# Patient Record
Sex: Female | Born: 1955 | Race: White | Hispanic: No | Smoking: Current every day smoker
Health system: Southern US, Community
[De-identification: ages and names within clinical notes are randomized; demographics above are authoritative.]

## PROBLEM LIST (undated history)

## (undated) DIAGNOSIS — F32A Depression, unspecified: Secondary | ICD-10-CM

## (undated) DIAGNOSIS — N750 Cyst of Bartholin's gland: Secondary | ICD-10-CM

## (undated) DIAGNOSIS — F17200 Nicotine dependence, unspecified, uncomplicated: Secondary | ICD-10-CM

## (undated) DIAGNOSIS — Z923 Personal history of irradiation: Secondary | ICD-10-CM

## (undated) DIAGNOSIS — J449 Chronic obstructive pulmonary disease, unspecified: Secondary | ICD-10-CM

## (undated) DIAGNOSIS — M8580 Other specified disorders of bone density and structure, unspecified site: Secondary | ICD-10-CM

## (undated) DIAGNOSIS — R739 Hyperglycemia, unspecified: Secondary | ICD-10-CM

## (undated) DIAGNOSIS — N823 Fistula of vagina to large intestine: Secondary | ICD-10-CM

## (undated) DIAGNOSIS — C801 Malignant (primary) neoplasm, unspecified: Secondary | ICD-10-CM

## (undated) DIAGNOSIS — F419 Anxiety disorder, unspecified: Secondary | ICD-10-CM

## (undated) DIAGNOSIS — F329 Major depressive disorder, single episode, unspecified: Secondary | ICD-10-CM

## (undated) HISTORY — DX: Depression, unspecified: F32.A

## (undated) HISTORY — PX: OTHER SURGICAL HISTORY: SHX169

## (undated) HISTORY — DX: Cyst of Bartholin's gland: N75.0

## (undated) HISTORY — DX: Anxiety disorder, unspecified: F41.9

## (undated) HISTORY — PX: NASAL SINUS SURGERY: SHX719

## (undated) HISTORY — DX: Major depressive disorder, single episode, unspecified: F32.9

## (undated) HISTORY — DX: Fistula of vagina to large intestine: N82.3

## (undated) HISTORY — DX: Chronic obstructive pulmonary disease, unspecified: J44.9

## (undated) HISTORY — DX: Personal history of irradiation: Z92.3

## (undated) HISTORY — DX: Nicotine dependence, unspecified, uncomplicated: F17.200

## (undated) HISTORY — DX: Hyperglycemia, unspecified: R73.9

## (undated) HISTORY — DX: Other specified disorders of bone density and structure, unspecified site: M85.80

## (undated) HISTORY — PX: BREAST LUMPECTOMY: SHX2

## (undated) HISTORY — PX: TONSILLECTOMY: SUR1361

---

## 1998-09-10 ENCOUNTER — Ambulatory Visit (HOSPITAL_COMMUNITY): Admission: AD | Admit: 1998-09-10 | Discharge: 1998-09-10 | Payer: Self-pay

## 1999-11-14 ENCOUNTER — Encounter: Payer: Self-pay | Admitting: Internal Medicine

## 1999-11-14 ENCOUNTER — Encounter: Admission: RE | Admit: 1999-11-14 | Discharge: 1999-11-14 | Payer: Self-pay | Admitting: Internal Medicine

## 2001-02-01 ENCOUNTER — Encounter: Admission: RE | Admit: 2001-02-01 | Discharge: 2001-02-01 | Payer: Self-pay | Admitting: Internal Medicine

## 2001-02-01 ENCOUNTER — Encounter: Payer: Self-pay | Admitting: Internal Medicine

## 2001-02-17 HISTORY — PX: KNEE SURGERY: SHX244

## 2002-02-03 ENCOUNTER — Encounter: Admission: RE | Admit: 2002-02-03 | Discharge: 2002-02-03 | Payer: Self-pay | Admitting: Internal Medicine

## 2002-02-03 ENCOUNTER — Encounter: Payer: Self-pay | Admitting: Internal Medicine

## 2002-05-16 ENCOUNTER — Inpatient Hospital Stay (HOSPITAL_COMMUNITY): Admission: EM | Admit: 2002-05-16 | Discharge: 2002-05-18 | Payer: Self-pay | Admitting: Emergency Medicine

## 2002-05-17 ENCOUNTER — Encounter: Payer: Self-pay | Admitting: Neurology

## 2002-05-18 ENCOUNTER — Encounter: Payer: Self-pay | Admitting: Cardiology

## 2003-01-20 ENCOUNTER — Encounter: Admission: RE | Admit: 2003-01-20 | Discharge: 2003-01-20 | Payer: Self-pay | Admitting: Internal Medicine

## 2003-11-10 ENCOUNTER — Encounter: Admission: RE | Admit: 2003-11-10 | Discharge: 2003-11-10 | Payer: Self-pay | Admitting: Internal Medicine

## 2003-11-21 ENCOUNTER — Encounter: Admission: RE | Admit: 2003-11-21 | Discharge: 2003-11-21 | Payer: Self-pay | Admitting: Orthopedic Surgery

## 2004-12-10 ENCOUNTER — Encounter: Admission: RE | Admit: 2004-12-10 | Discharge: 2004-12-10 | Payer: Self-pay | Admitting: Internal Medicine

## 2005-02-11 ENCOUNTER — Encounter: Admission: RE | Admit: 2005-02-11 | Discharge: 2005-02-11 | Payer: Self-pay | Admitting: Internal Medicine

## 2005-12-01 ENCOUNTER — Ambulatory Visit: Payer: Self-pay | Admitting: Family Medicine

## 2005-12-29 ENCOUNTER — Ambulatory Visit: Payer: Self-pay | Admitting: Family Medicine

## 2006-03-11 ENCOUNTER — Ambulatory Visit: Payer: Self-pay | Admitting: Internal Medicine

## 2006-04-20 ENCOUNTER — Ambulatory Visit: Payer: Self-pay | Admitting: Family Medicine

## 2006-05-27 DIAGNOSIS — F329 Major depressive disorder, single episode, unspecified: Secondary | ICD-10-CM

## 2006-05-27 DIAGNOSIS — F411 Generalized anxiety disorder: Secondary | ICD-10-CM | POA: Insufficient documentation

## 2006-05-27 DIAGNOSIS — J449 Chronic obstructive pulmonary disease, unspecified: Secondary | ICD-10-CM

## 2006-05-27 DIAGNOSIS — J301 Allergic rhinitis due to pollen: Secondary | ICD-10-CM

## 2006-09-02 ENCOUNTER — Telehealth (INDEPENDENT_AMBULATORY_CARE_PROVIDER_SITE_OTHER): Payer: Self-pay | Admitting: *Deleted

## 2006-10-26 ENCOUNTER — Ambulatory Visit: Payer: Self-pay | Admitting: Family Medicine

## 2006-10-26 ENCOUNTER — Telehealth (INDEPENDENT_AMBULATORY_CARE_PROVIDER_SITE_OTHER): Payer: Self-pay | Admitting: *Deleted

## 2007-01-08 ENCOUNTER — Ambulatory Visit: Payer: Self-pay | Admitting: Family Medicine

## 2007-01-10 LAB — CONVERTED CEMR LAB
Cholesterol: 156 mg/dL (ref 0–200)
Glucose, Bld: 109 mg/dL — ABNORMAL HIGH (ref 70–99)
HDL: 45 mg/dL (ref >39.0)
LDL Cholesterol: 100 mg/dL — ABNORMAL HIGH (ref 0–99)
TSH: 1.18 microintl units/mL (ref 0.35–5.50)
Total CHOL/HDL Ratio: 3.5
Triglycerides: 53 mg/dL (ref 0–149)
VLDL: 11 mg/dL (ref 0–40)

## 2007-01-11 ENCOUNTER — Encounter (INDEPENDENT_AMBULATORY_CARE_PROVIDER_SITE_OTHER): Payer: Self-pay | Admitting: *Deleted

## 2007-01-11 ENCOUNTER — Telehealth (INDEPENDENT_AMBULATORY_CARE_PROVIDER_SITE_OTHER): Payer: Self-pay | Admitting: *Deleted

## 2007-03-18 ENCOUNTER — Ambulatory Visit: Payer: Self-pay | Admitting: Family Medicine

## 2007-04-08 ENCOUNTER — Ambulatory Visit: Payer: Self-pay | Admitting: Family Medicine

## 2007-05-03 ENCOUNTER — Ambulatory Visit: Payer: Self-pay | Admitting: Internal Medicine

## 2007-05-03 DIAGNOSIS — F17200 Nicotine dependence, unspecified, uncomplicated: Secondary | ICD-10-CM

## 2007-06-22 ENCOUNTER — Ambulatory Visit: Payer: Self-pay | Admitting: Internal Medicine

## 2007-06-22 ENCOUNTER — Telehealth (INDEPENDENT_AMBULATORY_CARE_PROVIDER_SITE_OTHER): Payer: Self-pay | Admitting: *Deleted

## 2007-10-06 ENCOUNTER — Ambulatory Visit: Payer: Self-pay | Admitting: Internal Medicine

## 2007-10-06 DIAGNOSIS — E041 Nontoxic single thyroid nodule: Secondary | ICD-10-CM | POA: Insufficient documentation

## 2007-10-13 ENCOUNTER — Encounter: Admission: RE | Admit: 2007-10-13 | Discharge: 2007-10-13 | Payer: Self-pay | Admitting: Internal Medicine

## 2007-11-04 ENCOUNTER — Telehealth (INDEPENDENT_AMBULATORY_CARE_PROVIDER_SITE_OTHER): Payer: Self-pay | Admitting: *Deleted

## 2007-12-30 ENCOUNTER — Ambulatory Visit: Payer: Self-pay | Admitting: Internal Medicine

## 2007-12-30 DIAGNOSIS — R7309 Other abnormal glucose: Secondary | ICD-10-CM | POA: Insufficient documentation

## 2007-12-30 LAB — CONVERTED CEMR LAB
Cholesterol, target level: 200 mg/dL
HDL goal, serum: 40 mg/dL
LDL Goal: 160 mg/dL

## 2008-01-11 ENCOUNTER — Encounter (INDEPENDENT_AMBULATORY_CARE_PROVIDER_SITE_OTHER): Payer: Self-pay | Admitting: *Deleted

## 2008-01-11 LAB — CONVERTED CEMR LAB
Cholesterol: 205 mg/dL (ref 0–200)
Direct LDL: 125.2 mg/dL
HDL: 45 mg/dL (ref >39.0)
Hgb A1c MFr Bld: 6 % (ref 4.6–6.0)
Total CHOL/HDL Ratio: 4.6
Triglycerides: 76 mg/dL (ref 0–149)
VLDL: 15 mg/dL (ref 0–40)

## 2008-01-31 ENCOUNTER — Telehealth (INDEPENDENT_AMBULATORY_CARE_PROVIDER_SITE_OTHER): Payer: Self-pay | Admitting: *Deleted

## 2008-02-02 ENCOUNTER — Ambulatory Visit: Payer: Self-pay | Admitting: Internal Medicine

## 2008-02-02 DIAGNOSIS — D689 Coagulation defect, unspecified: Secondary | ICD-10-CM

## 2008-02-02 DIAGNOSIS — B370 Candidal stomatitis: Secondary | ICD-10-CM

## 2008-03-23 ENCOUNTER — Telehealth: Payer: Self-pay | Admitting: Internal Medicine

## 2008-03-27 ENCOUNTER — Ambulatory Visit: Payer: Self-pay | Admitting: Internal Medicine

## 2008-06-13 ENCOUNTER — Telehealth (INDEPENDENT_AMBULATORY_CARE_PROVIDER_SITE_OTHER): Payer: Self-pay | Admitting: *Deleted

## 2008-06-15 ENCOUNTER — Ambulatory Visit: Payer: Self-pay | Admitting: Internal Medicine

## 2008-06-22 ENCOUNTER — Telehealth (INDEPENDENT_AMBULATORY_CARE_PROVIDER_SITE_OTHER): Payer: Self-pay | Admitting: *Deleted

## 2008-07-12 ENCOUNTER — Telehealth (INDEPENDENT_AMBULATORY_CARE_PROVIDER_SITE_OTHER): Payer: Self-pay | Admitting: *Deleted

## 2008-07-14 ENCOUNTER — Telehealth (INDEPENDENT_AMBULATORY_CARE_PROVIDER_SITE_OTHER): Payer: Self-pay | Admitting: *Deleted

## 2008-07-21 ENCOUNTER — Telehealth (INDEPENDENT_AMBULATORY_CARE_PROVIDER_SITE_OTHER): Payer: Self-pay | Admitting: *Deleted

## 2008-10-05 ENCOUNTER — Telehealth (INDEPENDENT_AMBULATORY_CARE_PROVIDER_SITE_OTHER): Payer: Self-pay | Admitting: *Deleted

## 2009-01-01 ENCOUNTER — Encounter (INDEPENDENT_AMBULATORY_CARE_PROVIDER_SITE_OTHER): Payer: Self-pay | Admitting: *Deleted

## 2009-01-02 ENCOUNTER — Telehealth (INDEPENDENT_AMBULATORY_CARE_PROVIDER_SITE_OTHER): Payer: Self-pay | Admitting: *Deleted

## 2009-02-15 ENCOUNTER — Ambulatory Visit: Payer: Self-pay | Admitting: Internal Medicine

## 2009-02-19 ENCOUNTER — Encounter (INDEPENDENT_AMBULATORY_CARE_PROVIDER_SITE_OTHER): Payer: Self-pay | Admitting: *Deleted

## 2009-03-07 ENCOUNTER — Telehealth (INDEPENDENT_AMBULATORY_CARE_PROVIDER_SITE_OTHER): Payer: Self-pay | Admitting: *Deleted

## 2009-04-11 ENCOUNTER — Ambulatory Visit: Payer: Self-pay | Admitting: Internal Medicine

## 2010-01-03 ENCOUNTER — Ambulatory Visit: Payer: Self-pay | Admitting: Internal Medicine

## 2010-01-03 ENCOUNTER — Telehealth: Payer: Self-pay | Admitting: Internal Medicine

## 2010-01-03 DIAGNOSIS — R0902 Hypoxemia: Secondary | ICD-10-CM

## 2010-01-03 DIAGNOSIS — R609 Edema, unspecified: Secondary | ICD-10-CM

## 2010-01-04 LAB — CONVERTED CEMR LAB
ALT: 27 units/L (ref 0–35)
AST: 25 units/L (ref 0–37)
Albumin: 4.2 g/dL (ref 3.5–5.2)
Alkaline Phosphatase: 96 units/L (ref 39–117)
BUN: 10 mg/dL (ref 6–23)
Basophils Absolute: 0.1 10*3/uL (ref 0.0–0.1)
Basophils Relative: 0.8 % (ref 0.0–3.0)
Bilirubin, Direct: 0.3 mg/dL (ref 0.0–0.3)
CO2: 26 meq/L (ref 19–32)
Calcium: 9.2 mg/dL (ref 8.4–10.5)
Chloride: 101 meq/L (ref 96–112)
Creatinine, Ser: 0.7 mg/dL (ref 0.4–1.2)
Eosinophils Absolute: 0 10*3/uL (ref 0.0–0.7)
Eosinophils Relative: 0.4 % (ref 0.0–5.0)
GFR calc non Af Amer: 89.01 mL/min (ref >60)
Glucose, Bld: 97 mg/dL (ref 70–99)
HCT: 52.6 % — ABNORMAL HIGH (ref 36.0–46.0)
Hemoglobin: 17.6 g/dL — ABNORMAL HIGH (ref 12.0–15.0)
Lymphocytes Relative: 18.9 % (ref 12.0–46.0)
Lymphs Abs: 1.5 10*3/uL (ref 0.7–4.0)
MCHC: 33.5 g/dL (ref 30.0–36.0)
MCV: 103.4 fL — ABNORMAL HIGH (ref 78.0–100.0)
Monocytes Absolute: 0.7 10*3/uL (ref 0.1–1.0)
Monocytes Relative: 8.9 % (ref 3.0–12.0)
Neutro Abs: 5.8 10*3/uL (ref 1.4–7.7)
Neutrophils Relative %: 71 % (ref 43.0–77.0)
Platelets: 152 10*3/uL (ref 150.0–400.0)
Potassium: 4.4 meq/L (ref 3.5–5.1)
Pro B Natriuretic peptide (BNP): 203.7 pg/mL — ABNORMAL HIGH (ref 0.0–100.0)
RBC: 5.09 M/uL (ref 3.87–5.11)
RDW: 15.3 % — ABNORMAL HIGH (ref 11.5–14.6)
Sodium: 140 meq/L (ref 135–145)
Total Bilirubin: 1.5 mg/dL — ABNORMAL HIGH (ref 0.3–1.2)
Total Protein: 6.5 g/dL (ref 6.0–8.3)
WBC: 8.1 10*3/uL (ref 4.5–10.5)

## 2010-01-07 ENCOUNTER — Telehealth (INDEPENDENT_AMBULATORY_CARE_PROVIDER_SITE_OTHER): Payer: Self-pay | Admitting: *Deleted

## 2010-01-14 ENCOUNTER — Encounter: Payer: Self-pay | Admitting: Internal Medicine

## 2010-01-14 ENCOUNTER — Encounter (INDEPENDENT_AMBULATORY_CARE_PROVIDER_SITE_OTHER): Payer: Self-pay | Admitting: *Deleted

## 2010-01-14 ENCOUNTER — Ambulatory Visit: Payer: Self-pay | Admitting: Internal Medicine

## 2010-01-14 ENCOUNTER — Ambulatory Visit (HOSPITAL_COMMUNITY): Admission: RE | Admit: 2010-01-14 | Discharge: 2010-01-14 | Payer: Self-pay | Admitting: Internal Medicine

## 2010-01-14 ENCOUNTER — Ambulatory Visit: Payer: Self-pay

## 2010-01-15 ENCOUNTER — Telehealth: Payer: Self-pay | Admitting: Internal Medicine

## 2010-01-21 ENCOUNTER — Ambulatory Visit: Payer: Self-pay | Admitting: Internal Medicine

## 2010-01-21 ENCOUNTER — Telehealth (INDEPENDENT_AMBULATORY_CARE_PROVIDER_SITE_OTHER): Payer: Self-pay | Admitting: *Deleted

## 2010-01-21 DIAGNOSIS — I2789 Other specified pulmonary heart diseases: Secondary | ICD-10-CM

## 2010-01-21 DIAGNOSIS — J984 Other disorders of lung: Secondary | ICD-10-CM

## 2010-01-21 DIAGNOSIS — I2699 Other pulmonary embolism without acute cor pulmonale: Secondary | ICD-10-CM

## 2010-01-23 ENCOUNTER — Ambulatory Visit: Payer: Self-pay | Admitting: Internal Medicine

## 2010-01-23 ENCOUNTER — Telehealth (INDEPENDENT_AMBULATORY_CARE_PROVIDER_SITE_OTHER): Payer: Self-pay | Admitting: *Deleted

## 2010-01-24 ENCOUNTER — Encounter (INDEPENDENT_AMBULATORY_CARE_PROVIDER_SITE_OTHER): Payer: Self-pay | Admitting: *Deleted

## 2010-01-24 ENCOUNTER — Telehealth: Payer: Self-pay | Admitting: Internal Medicine

## 2010-01-29 ENCOUNTER — Encounter: Payer: Self-pay | Admitting: Internal Medicine

## 2010-01-29 DIAGNOSIS — R0602 Shortness of breath: Secondary | ICD-10-CM

## 2010-02-04 ENCOUNTER — Ambulatory Visit: Payer: Self-pay | Admitting: Internal Medicine

## 2010-02-05 ENCOUNTER — Telehealth: Payer: Self-pay | Admitting: Internal Medicine

## 2010-02-05 ENCOUNTER — Encounter (INDEPENDENT_AMBULATORY_CARE_PROVIDER_SITE_OTHER): Payer: Self-pay | Admitting: *Deleted

## 2010-02-08 ENCOUNTER — Encounter: Payer: Self-pay | Admitting: Internal Medicine

## 2010-02-12 ENCOUNTER — Telehealth: Payer: Self-pay | Admitting: Internal Medicine

## 2010-02-12 ENCOUNTER — Encounter: Payer: Self-pay | Admitting: Internal Medicine

## 2010-02-17 ENCOUNTER — Encounter: Payer: Self-pay | Admitting: Internal Medicine

## 2010-02-20 ENCOUNTER — Encounter: Payer: Self-pay | Admitting: Internal Medicine

## 2010-02-25 ENCOUNTER — Telehealth: Payer: Self-pay | Admitting: Internal Medicine

## 2010-02-25 ENCOUNTER — Encounter: Payer: Self-pay | Admitting: Internal Medicine

## 2010-02-27 ENCOUNTER — Telehealth (INDEPENDENT_AMBULATORY_CARE_PROVIDER_SITE_OTHER): Payer: Self-pay | Admitting: *Deleted

## 2010-03-01 ENCOUNTER — Ambulatory Visit
Admission: RE | Admit: 2010-03-01 | Discharge: 2010-03-01 | Payer: Self-pay | Source: Home / Self Care | Attending: Internal Medicine | Admitting: Internal Medicine

## 2010-03-01 ENCOUNTER — Ambulatory Visit
Admission: RE | Admit: 2010-03-01 | Discharge: 2010-03-01 | Payer: Self-pay | Source: Home / Self Care | Attending: Critical Care Medicine | Admitting: Critical Care Medicine

## 2010-03-01 ENCOUNTER — Other Ambulatory Visit: Payer: Self-pay | Admitting: Internal Medicine

## 2010-03-01 ENCOUNTER — Encounter: Payer: Self-pay | Admitting: Critical Care Medicine

## 2010-03-01 LAB — CBC WITH DIFFERENTIAL/PLATELET
Basophils Absolute: 0 10*3/uL (ref 0.0–0.1)
Basophils Relative: 0.5 % (ref 0.0–3.0)
Eosinophils Absolute: 0 10*3/uL (ref 0.0–0.7)
Eosinophils Relative: 0.5 % (ref 0.0–5.0)
HCT: 58.6 % — ABNORMAL HIGH (ref 36.0–46.0)
Hemoglobin: 19.9 g/dL — ABNORMAL HIGH (ref 12.0–15.0)
Lymphocytes Relative: 16.2 % (ref 12.0–46.0)
Lymphs Abs: 1 10*3/uL (ref 0.7–4.0)
MCHC: 33.9 g/dL (ref 30.0–36.0)
MCV: 100.3 fl — ABNORMAL HIGH (ref 78.0–100.0)
Monocytes Absolute: 0.8 10*3/uL (ref 0.1–1.0)
Monocytes Relative: 12.2 % — ABNORMAL HIGH (ref 3.0–12.0)
Neutro Abs: 4.5 10*3/uL (ref 1.4–7.7)
Neutrophils Relative %: 70.6 % (ref 43.0–77.0)
Platelets: 132 10*3/uL — ABNORMAL LOW (ref 150.0–400.0)
RBC: 5.84 Mil/uL — ABNORMAL HIGH (ref 3.87–5.11)
RDW: 14.7 % — ABNORMAL HIGH (ref 11.5–14.6)
WBC: 6.4 10*3/uL (ref 4.5–10.5)

## 2010-03-01 LAB — PROTIME-INR
INR: 1.1 ratio — ABNORMAL HIGH (ref 0.8–1.0)
Prothrombin Time: 12.3 s (ref 10.2–12.4)

## 2010-03-01 LAB — BASIC METABOLIC PANEL
BUN: 8 mg/dL (ref 6–23)
CO2: 32 mEq/L (ref 19–32)
Calcium: 9.6 mg/dL (ref 8.4–10.5)
Chloride: 100 mEq/L (ref 96–112)
Creatinine, Ser: 0.7 mg/dL (ref 0.4–1.2)
GFR: 91.85 mL/min (ref >60.00)
Glucose, Bld: 113 mg/dL — ABNORMAL HIGH (ref 70–99)
Potassium: 5.2 mEq/L — ABNORMAL HIGH (ref 3.5–5.1)
Sodium: 142 mEq/L (ref 135–145)

## 2010-03-01 LAB — APTT: aPTT: 33.2 s — ABNORMAL HIGH (ref 21.7–28.8)

## 2010-03-04 ENCOUNTER — Telehealth (INDEPENDENT_AMBULATORY_CARE_PROVIDER_SITE_OTHER): Payer: Self-pay | Admitting: *Deleted

## 2010-03-08 ENCOUNTER — Ambulatory Visit
Admission: RE | Admit: 2010-03-08 | Payer: Self-pay | Source: Home / Self Care | Attending: Cardiology | Admitting: Cardiology

## 2010-03-10 ENCOUNTER — Encounter: Payer: Self-pay | Admitting: Orthopedic Surgery

## 2010-03-11 LAB — POCT I-STAT 3, VENOUS BLOOD GAS (G3P V)
Acid-Base Excess: 4 mmol/L — ABNORMAL HIGH (ref 0.0–2.0)
Acid-Base Excess: 6 mmol/L — ABNORMAL HIGH (ref 0.0–2.0)
Acid-Base Excess: 6 mmol/L — ABNORMAL HIGH (ref 0.0–2.0)
Acid-Base Excess: 7 mmol/L — ABNORMAL HIGH (ref 0.0–2.0)
Acid-Base Excess: 8 mmol/L — ABNORMAL HIGH (ref 0.0–2.0)
Acid-Base Excess: 9 mmol/L — ABNORMAL HIGH (ref 0.0–2.0)
Bicarbonate: 34.4 mEq/L — ABNORMAL HIGH (ref 20.0–24.0)
Bicarbonate: 35.4 mEq/L — ABNORMAL HIGH (ref 20.0–24.0)
Bicarbonate: 35.8 mEq/L — ABNORMAL HIGH (ref 20.0–24.0)
Bicarbonate: 38.2 mEq/L — ABNORMAL HIGH (ref 20.0–24.0)
Bicarbonate: 39.4 mEq/L — ABNORMAL HIGH (ref 20.0–24.0)
O2 Saturation: 51 %
O2 Saturation: 54 %
O2 Saturation: 54 %
O2 Saturation: 55 %
O2 Saturation: 56 %
TCO2: 36 mmol/L (ref 0–100)
TCO2: 36 mmol/L (ref 0–100)
TCO2: 37 mmol/L (ref 0–100)
TCO2: 38 mmol/L (ref 0–100)
TCO2: 38 mmol/L (ref 0–100)
TCO2: 40 mmol/L (ref 0–100)
TCO2: 42 mmol/L (ref 0–100)
pCO2, Ven: 65.1 mmHg — ABNORMAL HIGH (ref 45.0–50.0)
pCO2, Ven: 67.2 mmHg — ABNORMAL HIGH (ref 45.0–50.0)
pCO2, Ven: 67.6 mmHg — ABNORMAL HIGH (ref 45.0–50.0)
pCO2, Ven: 69 mmHg — ABNORMAL HIGH (ref 45.0–50.0)
pCO2, Ven: 71 mmHg (ref 45.0–50.0)
pCO2, Ven: 72.6 mmHg (ref 45.0–50.0)
pH, Ven: 7.299 (ref 7.250–7.300)
pH, Ven: 7.33 — ABNORMAL HIGH (ref 7.250–7.300)
pH, Ven: 7.335 — ABNORMAL HIGH (ref 7.250–7.300)
pH, Ven: 7.335 — ABNORMAL HIGH (ref 7.250–7.300)
pH, Ven: 7.336 — ABNORMAL HIGH (ref 7.250–7.300)
pH, Ven: 7.338 — ABNORMAL HIGH (ref 7.250–7.300)
pH, Ven: 7.342 — ABNORMAL HIGH (ref 7.250–7.300)
pO2, Ven: 26 mmHg — CL (ref 30.0–45.0)
pO2, Ven: 30 mmHg (ref 30.0–45.0)
pO2, Ven: 31 mmHg (ref 30.0–45.0)
pO2, Ven: 32 mmHg (ref 30.0–45.0)
pO2, Ven: 32 mmHg (ref 30.0–45.0)
pO2, Ven: 32 mmHg (ref 30.0–45.0)

## 2010-03-11 LAB — POCT I-STAT 3, ART BLOOD GAS (G3+)
Bicarbonate: 38.9 mEq/L — ABNORMAL HIGH (ref 20.0–24.0)
O2 Saturation: 94 %
TCO2: 41 mmol/L (ref 0–100)
pCO2 arterial: 68.9 mmHg (ref 35.0–45.0)
pH, Arterial: 7.359 (ref 7.350–7.400)
pO2, Arterial: 77 mmHg — ABNORMAL LOW (ref 80.0–100.0)

## 2010-03-11 LAB — GLUCOSE, POCT (MANUAL RESULT ENTRY)
Glucose, Bld: 102 mg/dL — ABNORMAL HIGH (ref 70–99)
Operator id: 221371

## 2010-03-14 NOTE — Procedures (Addendum)
  NAME:  Sherry Bautista, Sherry Bautista                ACCOUNT NO.:  [id number]  MEDICAL RECORD NO.:  [id number]          PATIENT TYPE:  OIB  LOCATION:  1962                         FACILITY:  [facility]  PHYSICIAN:  Rollene Rotunda, MD, FACCDATE OF BIRTH:  08/01/1955  DATE OF PROCEDURE: DATE OF DISCHARGE:  03/08/2010                           CARDIAC CATHETERIZATION   PRIMARY:  Titus Dubin. Alwyn Ren, MD,FACP,FCCP  CARDIOLOGIST:  Pricilla Riffle, MD, [facility]  PROCEDURE:  Left and right heart catheterization/coronary arteriography.  INDICATIONS:  The patient with dyspnea, pulmonary hypertension, and an abnormal EKG.  PROCEDURE NOTE:  Right and left heart catheterization was performed via the right femoral vein and artery respectively.  Both vessels were cannulated using anterior wall puncture.  A #7-French venous sheath and a #4-French arterial sheath were inserted via the modified Seldinger technique.  Preformed Judkins and pigtail catheter were utilized.  The patient tolerated the procedure well and left the lab in stable condition.  RESULTS:  Hemodynamics:  RA mean 8, RV 72/9, PA 73/36 with a mean of 50, pulmonary capillary wedge pressure mean 34 (this was sampled in multiple areas and was 34-40 each time except in the extreme right upper lobe where it was found to be low where it was over wedged.  Therefore, I do believe that the true wedge is in the 30-40 mmHg range), left ventricle 114/14, AO 119/101.  Sats, inferior vena cava 42%, right atrium below 51%, right atrium mid 51%, right atrium high 50%, SVC 54%, right ventricle 56%, PA 54%, AO 94% (all sats were on 3 liters).  Coronaries: Left main was short and normal.  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  The mid and distal vessel had luminal irregularities. First and second diagonal were small and normal.  Circumflex was essentially dominant.  The AV groove had luminal irregularities.  Mid obtuse marginal was large and normal.  PDA was large and  normal. Posterolateral was moderate size and normal.  The right coronary artery was moderate-sized, nondominant, and normal throughout its course.  Left ventriculogram:  The left ventriculogram was obtained in the RAO projection.  The EF was 65%.  CONCLUSION:  Severe pulmonary hypertension.  There is an elevated wedge pressure as described above.  There is normal LV function, minimal coronary artery plaquing, and no evidence for shunt.  PLAN:  The patient's further management and evaluation of pulmonary hypertension per Dr. Tenny Craw.    Rollene Rotunda, MD, [facility]    JH/MEDQ  D:  03/08/2010  T:  03/08/2010  Job:  [id number]  Electronically Signed by Rollene Rotunda MD [facility] on 03/14/2010 12:30:48 PM

## 2010-03-17 LAB — CONVERTED CEMR LAB
ALT: 30 units/L (ref 0–35)
AST: 26 units/L (ref 0–37)
Albumin: 4.1 g/dL (ref 3.5–5.2)
Alkaline Phosphatase: 86 units/L (ref 39–117)
BUN: 5 mg/dL — ABNORMAL LOW (ref 6–23)
Basophils Absolute: 0.1 10*3/uL (ref 0.0–0.1)
Basophils Relative: 1.1 % (ref 0.0–3.0)
Bilirubin, Direct: 0.2 mg/dL (ref 0.0–0.3)
CO2: 31 meq/L (ref 19–32)
Calcium: 9.6 mg/dL (ref 8.4–10.5)
Chloride: 101 meq/L (ref 96–112)
Cholesterol: 183 mg/dL (ref 0–200)
Creatinine, Ser: 0.7 mg/dL (ref 0.4–1.2)
Eosinophils Absolute: 0 10*3/uL (ref 0.0–0.7)
Eosinophils Relative: 0.6 % (ref 0.0–5.0)
GFR calc non Af Amer: 93.75 mL/min (ref >60)
Glucose, Bld: 102 mg/dL — ABNORMAL HIGH (ref 70–99)
HCT: 50 % — ABNORMAL HIGH (ref 36.0–46.0)
HDL: 49.5 mg/dL (ref >39.00)
Hemoglobin: 17.4 g/dL — ABNORMAL HIGH (ref 12.0–15.0)
Hgb A1c MFr Bld: 6.1 % (ref 4.6–6.5)
LDL Cholesterol: 122 mg/dL — ABNORMAL HIGH (ref 0–99)
Lymphocytes Relative: 27 % (ref 12.0–46.0)
Lymphs Abs: 2.1 10*3/uL (ref 0.7–4.0)
MCHC: 34.9 g/dL (ref 30.0–36.0)
MCV: 101 fL — ABNORMAL HIGH (ref 78.0–100.0)
Monocytes Absolute: 0.7 10*3/uL (ref 0.1–1.0)
Monocytes Relative: 9.2 % (ref 3.0–12.0)
Neutro Abs: 4.7 10*3/uL (ref 1.4–7.7)
Neutrophils Relative %: 62.1 % (ref 43.0–77.0)
Platelets: 145 10*3/uL — ABNORMAL LOW (ref 150.0–400.0)
Potassium: 4.5 meq/L (ref 3.5–5.1)
RBC: 4.95 M/uL (ref 3.87–5.11)
RDW: 13.5 % (ref 11.5–14.6)
Sodium: 138 meq/L (ref 135–145)
TSH: 0.73 microintl units/mL (ref 0.35–5.50)
Total Bilirubin: 1.6 mg/dL — ABNORMAL HIGH (ref 0.3–1.2)
Total CHOL/HDL Ratio: 4
Total Protein: 6.9 g/dL (ref 6.0–8.3)
Triglycerides: 59 mg/dL (ref 0.0–149.0)
VLDL: 11.8 mg/dL (ref 0.0–40.0)
WBC: 7.6 10*3/uL (ref 4.5–10.5)

## 2010-03-18 ENCOUNTER — Telehealth: Payer: Self-pay | Admitting: Internal Medicine

## 2010-03-18 ENCOUNTER — Encounter: Payer: Self-pay | Admitting: Internal Medicine

## 2010-03-19 ENCOUNTER — Ambulatory Visit
Admission: RE | Admit: 2010-03-19 | Discharge: 2010-03-19 | Payer: Self-pay | Source: Home / Self Care | Attending: Critical Care Medicine | Admitting: Critical Care Medicine

## 2010-03-19 ENCOUNTER — Encounter: Payer: Self-pay | Admitting: Critical Care Medicine

## 2010-03-19 NOTE — Progress Notes (Signed)
Summary: clarify direction  Phone Note Refill Request Message from:  Fax from Pharmacy  Refills Requested: Medication #1:  PROVENTIL HFA 108 (90 BASE) MCG/ACT  AERS [facility] - not from pharmacy "insurance requires dose & frequency le 1-2 puffs q 4-6 h please provide asap"  Initial call taken by: Okey Regal Spring,  January 21, 2010 1:16 PM    New/Updated Medications: PROVENTIL HFA 108 (90 BASE) MCG/ACT  AERS (ALBUTEROL SULFATE) 1-2 inh every 4-6 hours as needed Prescriptions: PROVENTIL HFA 108 (90 BASE) MCG/ACT  AERS (ALBUTEROL SULFATE) 1-2 inh every 4-6 hours as needed  #1 x 1   Entered by:   Shonna Chock CMA   Authorized by:   Marga Melnick MD   Signed by:   Shonna Chock CMA on 01/21/2010   Method used:   Electronically to        [facility]       [address]       [place], [place]  [address]       Ph: [phone]       Fax: [phone]   RxID:   [id number]

## 2010-03-19 NOTE — Progress Notes (Signed)
Summary: Echo Results  Phone Note Outgoing Call Call back at Home Phone [phone]   Call placed by: Shonna Chock CMA,  January 15, 2010 1:20 PM Call placed to: Patient Details for Reason: Echo Results Summary of Call: Left message on machine for patient to return call when avaliable, Reason for call:   marked changes in right ventricle,new vs prior ECHO. These are most likely  related to pulmonary issues. Cardiology will evaluate these findings ; their office will call with appt.   Shonna Chock CMA  January 15, 2010 1:20 PM   Follow-up for Phone Call        Patient notified of the above and notes that she has an appt for next week. Follow-up by: Lucious Groves CMA,  January 15, 2010 4:32 PM

## 2010-03-19 NOTE — Progress Notes (Signed)
Summary: refill  Phone Note Refill Request Message from:  Fax from Pharmacy on [facility] [address] fax [phone]  paroxetine cr 37.5mg   Initial call taken by: Barb Merino,  March 07, 2009 8:06 AM    Prescriptions: PAXIL CR 37.5 MG  TB24 (PAROXETINE HCL) 1 tab qd  #30 x 11   Entered by:   Shonna Chock   Authorized by:   Marga Melnick MD   Signed by:   Shonna Chock on 03/07/2009   Method used:   Faxed to ...       [facility]       [address]       [place], [place]  [address]       Ph: [phone]       Fax: [phone]   RxID:   [id number]

## 2010-03-19 NOTE — Progress Notes (Signed)
Summary: refill  Phone Note Refill Request Message from:  Fax from Pharmacy on January 07, 2010 11:22 AM  Refills Requested: Medication #1:  PROVENTIL HFA 108 (14 BASE) MCG/ACT  AERS Trisha Mangle [phone]  tel [phone]  Initial call taken by: Okey Regal Spring,  January 07, 2010 11:23 AM    Prescriptions: PROVENTIL HFA 108 (90 BASE) MCG/ACT  AERS (ALBUTEROL SULFATE)   #1 x 1   Entered by:   Shonna Chock CMA   Authorized by:   Marga Melnick MD   Signed by:   Shonna Chock CMA on 01/07/2010   Method used:   Faxed to ...       [facility]       [address]       [place], [place]  [address]       Ph: [phone]       Fax: [phone]   RxID:   [id number]

## 2010-03-19 NOTE — Progress Notes (Signed)
Summary: spiriva   Phone Note Call from Patient Call back at Home Phone [phone]   Summary of Call: Patient called to find out if she should stop Spiriva. Please advise.Lucious Groves CMA,  January 03, 2010 2:48 PM  No, per MD. I called to notify the patient, left message on machine to call back to office. Lucious Groves CMA  January 03, 2010 3:17 PM   Patient notified. Lucious Groves CMA  January 03, 2010 4:06 PM

## 2010-03-19 NOTE — Letter (Signed)
Summary: Outpatient Coinsurance Notice  Outpatient Coinsurance Notice   Imported By: Marylou Mccoy 01/16/2010 11:57:32  _____________________________________________________________________  External Attachment:    Type:   Image     Comment:   External Document

## 2010-03-19 NOTE — Progress Notes (Signed)
Summary: pt needs note for work  Phone Note Call from Patient Call back at [employer] [phone]   Caller: Patient Reason for Call: Talk to Nurse, Talk to Doctor Summary of Call: pt needs a MD note for being out of work  Initial call taken by: Omer Jack,  January 24, 2010 8:05 AM  Follow-up for Phone Call        spoke with pt, she has been out of work this week tues/wed and thur due to her breathing. she thinks it is probably due to the weather but wanted to know if dr Tenny Craw would write a letter for her. will discuss with dr Tenny Craw Deliah Goody, RN  January 24, 2010 10:03 AM  discussed with dr Tenny Craw, okay given for letter. per pt will fax letter to [facility] @ [phone] Deliah Goody, RN  January 24, 2010 10:28 AM

## 2010-03-19 NOTE — Assessment & Plan Note (Signed)
Summary: sore in nose--acute only//tl   Vital Signs:  Patient Profile:   54 Years Old Female Height:     65 inches Weight:      136.13 pounds Temp:     99.2 degrees F oral Pulse rate:   82 / minute Pulse rhythm:   regular Resp:     20 per minute BP sitting:   108 / 78  (left arm)  Pt. in pain?   no  Vitals Entered By: Ardyth Man (March 18, 2007 12:31 PM)                  Chief Complaint:  sore in right side nostril .  History of Present Illness: Reports a recurrent sore inside the right nostril for last several months. Most recent occurrence was 3 days ago. Tender to touch, but now almost gone since she made the appt. Was very sore yesterday.  Current Allergies: ! CODEINE SULFATE (CODEINE SULFATE) ! AZITHROMYCIN (AZITHROMYCIN) ! TETANUS-DIPHTHERIA TOXOIDS TD (TETANUS-DIPHTHERIA TOXOIDS TD)      Physical Exam  General:     Well-developed,well-nourished,in no acute distress; alert,appropriate and cooperative throughout examination Nose:     no external deformity, nose piercing noted, no external erythema, and no nasal discharge. Slight tenderness just inside the right uppe nostril Mouth:     Oral mucosa and oropharynx without lesions or exudates.  Teeth in good repair.    Impression & Recommendations:  Problem # 1:  OTHER DISEASES OF NASAL CAVITY AND SINUSES (ICD-478.19) Most likely a localized folliculitis( pustule)  which is now improving.  Apply small amount of neosporin two times a day for seven days just inside of nostril. F/u if it worsens Will refer to ENT given recurrence of lesion/eruption  Orders: ENT Referral (ENT)   Complete Medication List: 1)  Advair Diskus 250-50 Mcg/dose Misc (Fluticasone-salmeterol) .... Use one puff two times a day 2)  Proventil Hfa 108 (90 Base) Mcg/act Aers (Albuterol sulfate) 3)  Alprazolam 0.25 Mg Tabs (Alprazolam) .... Take one tablet daily as needed.     ]

## 2010-03-19 NOTE — Letter (Signed)
Summary: Results Follow up Letter  [facility] [place]  [address] [place], [place] [address]   Phone: [phone]  Fax: [phone]    02/19/2009 MRN: [id number]  Sherry Bautista [address] [place], [place]  [address]  Dear Ms. GLADU,  The following are the results of your recent test(s):  Test         Result    Pap Smear:        Normal _____  Not Normal _____ Comments: ______________________________________________________ Cholesterol: LDL(Bad cholesterol):         Your goal is less than:         HDL (Good cholesterol):       Your goal is more than: Comments:  ______________________________________________________ Mammogram:        Normal _____  Not Normal _____ Comments:  ___________________________________________________________________ Hemoccult:        Normal _____  Not normal _______ Comments:    _____________________________________________________________________ Other Tests: Please see attached labs done on 02/15/2009, call to schedule appointment for 6 month recheck [phone] Ext: (0)    We routinely do not discuss normal results over the telephone.  If you desire a copy of the results, or you have any questions about this information we can discuss them at your next office visit.   Sincerely,

## 2010-03-19 NOTE — Progress Notes (Signed)
SummaryElwin Sleight prescription  Phone Note Refill Request Call back at Home Phone [phone] Message from:  Patient on January 23, 2010 3:23 PM  Refills Requested: Medication #1:  DULERA 200-5 MCG/ACT AERO 2 puffs every 12 hrs ; gargle & spit after use given sample on 11/17---working wwll--please call [facility] in [facility]  Next Appointment Scheduled: none Initial call taken by: Jerolyn Shin,  January 23, 2010 3:24 PM    Prescriptions: DULERA 200-5 MCG/ACT AERO (MOMETASONE FURO-FORMOTEROL FUM) 2 puffs every 12 hrs ; gargle & spit after use  #1 x 5   Entered by:   Shonna Chock CMA   Authorized by:   Marga Melnick MD   Signed by:   Shonna Chock CMA on 01/23/2010   Method used:   Electronically to        [facility]       [address]       [place], [place]  [address]       Ph: [phone]       Fax: [phone]   RxID:   [id number]

## 2010-03-19 NOTE — Assessment & Plan Note (Signed)
Summary: ankle/foot swollen/cbs-   Vital Signs:  Patient profile:   55 year old female Weight:      161 pounds BMI:     26.89 Temp:     98.7 degrees F oral Pulse rate:   80 / minute Resp:     18 per minute BP sitting:   130 / 86  (left arm) Cuff size:   regular  Vitals Entered By: Shonna Chock CMA (January 03, 2010 10:30 AM) CC: Left ankle/foot swollen x 1 week , Lower Extremity Joint pain, CHF follow-up   CC:  Left ankle/foot swollen x 1 week , Lower Extremity Joint pain, and CHF follow-up.  History of Present Illness: Lower Extremity  Edema:      This is a 55 year old woman who presents with Lower Extremity Edema X 1 week @ the L ankle.  The patient reports swelling and redness, but denies giving away, locking, popping, and decreased ROM. She has noted redness in toes for several years.   The patient reports dyspnea on exertion and fatigue, but denies dyspnea at rest, orthopnea, and PND.  Associated symptoms include cough with daily sputum  and  occasional muscle cramps.  The patient denies chest pain at rest, exertional chest pain, palpitations, lightheadedness, and urinary frequency.  Smoking 1.5-2 ppd. O2 sats of 83%. She states this has been finding " 10 years ago " @ her previous primary care MD's office. She was referred to  [facility] @ that time .She is unfamiliar with the diagnosis  of Cor Pulmonale.  Current Medications (verified): 1)  Advair Diskus 250-50 Mcg/dose  Misc (Fluticasone-Salmeterol) .... Use One Puff Two Times A Day 2)  Proventil Hfa 108 (90 Base) Mcg/act  Aers (Albuterol Sulfate) 3)  Alprazolam 0.25 Mg  Tabs (Alprazolam) .... Take One Tablet Daily As Needed. 4)  Paxil Cr 37.5 Mg  Tb24 (Paroxetine Hcl) .[facility].. 1 Tab Qd 5)  Spiriva Handihaler 18 Mcg  Caps (Tiotropium Bromide Monohydrate) .... Inhale Contents of 1 Pill 30 Min After Advair Only in Am 6)  Depo Provera Injection .... Q 3 Months  Allergies: 1)  ! Codeine Sulfate (Codeine Sulfate) 2)  ! Azithromycin  (Azithromycin) 3)  ! Tetanus-Diphtheria Toxoids Td (Tetanus-Diphtheria Toxoids Td)  Review of Systems General:  Denies chills, fever, sweats, and weight loss. Resp:  Denies chest pain with inspiration and coughing up blood.  Physical Exam  General:  in no acute distress; alert,appropriate and cooperative throughout examination; affect slightly flat Lungs:  Normal respiratory effort, chest expands symmetrically. Lungs : mild rhonchi  w/o increased WOB. O2 sats 83-84 % Heart:  normal rate, regular rhythm, no murmur, no rub, no JVD, and no HJR.   S4 Abdomen:  Bowel sounds positive,abdomen soft and non-tender without masses, organomegaly or hernias noted. Pulses:  R and L carotid,radial,dorsalis pedis and posterior tibial pulses are full and equal bilaterally Extremities:  1+ left pedal edema and 1/2 + right pedal edema.  Clubbing & cyanosis. Skin:  see feet; weathered facies Cervical Nodes:  No lymphadenopathy noted Axillary Nodes:  No palpable lymphadenopathy Psych:  flat affect and subdued.     Foot/Ankle Exam  Skin:    Plethora of feet with cool feet  Palpation:    No ankle tenderness; full ROM   Impression & Recommendations:  Problem # 1:  EDEMA- LOCALIZED (ICD-782.3)  ? Cor Pulmonale; pathophysiology discussed  Orders: Echo Referral (Echo) T-2 View CXR (71020TC) Venipuncture (54098) TLB-Hepatic/Liver Function Pnl (80076-HEPATIC) TLB-BMP (Basic Metabolic Panel-BMET) (80048-METABOL)  TLB-BNP (B-Natriuretic Peptide) (83880-BNPR) Specimen Handling (54098)  Problem # 2:  HYPOXEMIA (ICD-799.02) clinically stable despite very low O2 sats which she states are chronic Orders: Echo Referral (Echo) TLB-CBC Platelet - w/Differential (85025-CBCD) TLB-BNP (B-Natriuretic Peptide) (83880-BNPR) Specimen Handling (11914)  Problem # 3:  SMOKER (ICD-305.1) role of smoking in #1 & # 2 discussed Orders: Echo Referral (Echo) T-2 View CXR (71020TC)  Complete Medication List: 1)   Proventil Hfa 108 (90 Base) Mcg/act Aers (Albuterol sulfate) 2)  Alprazolam 0.25 Mg Tabs (Alprazolam) .... Take one tablet daily as needed. 3)  Paxil Cr 37.5 Mg Tb24 (paroxetine Hcl)  .[facility].. 1 tab qd 4)  Spiriva Handihaler 18 Mcg Caps (Tiotropium bromide monohydrate) .... Inhale contents of 1 pill 30 min after advair only in am 5)  Depo Provera Injection  .... Q 3 months 6)  Dulera 200-5 Mcg/act Aero (Mometasone furo-formoterol fum) .... 2 puffs every 12 hrs ; gargle & spit after use  Other Orders: Admin 1st Vaccine (78295) Flu Vaccine 59yrs + (62130)  Patient Instructions: 1)  Stop Smoking Tips: Choose a Quit date. Cut down before the Quit date. decide what you will do as a substitute when you feel the urge to smoke(gum,toothpick,exercise). Use Dulera 2 puffs every 12 hrs ; gargle & spit after use. Prescriptions: DULERA 200-5 MCG/ACT AERO (MOMETASONE FURO-FORMOTEROL FUM) 2 puffs every 12 hrs ; gargle & spit after use  #1 x 0   Entered and Authorized by:   Marga Melnick MD   Signed by:   Marga Melnick MD on 01/04/2010   Method used:   Samples Given   RxID:   [id number]    Orders Added: 1)  Est. Patient Level IV [32440] 2)  Echo Referral [Echo] 3)  T-2 View CXR [71020TC] 4)  Venipuncture [36415] 5)  TLB-CBC Platelet - w/Differential [85025-CBCD] 6)  TLB-Hepatic/Liver Function Pnl [80076-HEPATIC] 7)  TLB-BMP (Basic Metabolic Panel-BMET) [80048-METABOL] 8)  TLB-BNP (B-Natriuretic Peptide) [83880-BNPR] 9)  Specimen Handling [99000] 10)  Admin 1st Vaccine [90471] 11)  Flu Vaccine 23yrs + [10272] Flu Vaccine Consent Questions     Do you have a history of severe allergic reactions to this vaccine? no    Any prior history of allergic reactions to egg and/or gelatin? no    Do you have a sensitivity to the preservative Thimersol? no    Do you have a past history of Guillan-Barre Syndrome? no    Do you currently have an acute febrile illness? no    Have you ever had a severe reaction  to latex? no    Vaccine information given and explained to patient? yes    Are you currently pregnant? no    Lot Number:[id number]   Exp Date:08/17/2010   Site Given  Right Deltoid IMecimen Handling [99000] 10)  Admin 1st Vaccine [90471] 11)  Flu Vaccine 10yrs + [53664]      .lbflu

## 2010-03-19 NOTE — Miscellaneous (Signed)
Summary: Appointment Canceled  Appointment status changed to canceled by [employer] on 01/04/2010 12:00 PM.  Cancellation Comments --------------------- echo/ 782.3/ 799.02/ 305.1 . pt has [place], office to precret./ gd  Appointment Information ----------------------- Appt Type:  CARDIOLOGY ANCILLARY VISIT      Date:  Monday, January 07, 2010      Time:  4:00 PM for 60 min   Urgency:  Routine   Made By:  Hoy Finlay Scheduler  To Visit:  [id number]    Reason:  echo/ 782.3/ 799.02/ 305.1 . pt has [place], office to precret./ gd  Appt Comments ------------- -- 01/04/10 12:00: (CEMR) CANCELED -- echo/ 782.3/ 799.02/ 305.1 . pt has [place], office to precret./ gd -- 01/04/10 11:56: (CEMR) BOOKED -- Routine CARDIOLOGY ANCILLARY VISIT at 01/07/2010 4:00 PM for 60 min echo/ 782.3/ 799.02/ 305.1 . pt has [place],

## 2010-03-19 NOTE — Letter (Signed)
Summary: Generic Letter  Architectural technologist, Main Office  [address]   [place], [place] [address]   Phone: [phone]  Fax: [phone]        January 24, 2010 MRN: [id number]    [name] [address] [place], [place]  [address]    TO [facility] IT MAY CONCERN,       Sherry Bautista was out of work tuesday, wednesday and thursday this week due to trouble with her breathing. Please contact us with any questions or concerns.   Sincerely,  Deliah Goody, RN/Dr Dietrich Pates

## 2010-03-20 ENCOUNTER — Encounter: Payer: Self-pay | Admitting: Critical Care Medicine

## 2010-03-20 ENCOUNTER — Telehealth (INDEPENDENT_AMBULATORY_CARE_PROVIDER_SITE_OTHER): Payer: Self-pay | Admitting: *Deleted

## 2010-03-21 ENCOUNTER — Encounter: Payer: Self-pay | Admitting: Critical Care Medicine

## 2010-03-21 ENCOUNTER — Telehealth (INDEPENDENT_AMBULATORY_CARE_PROVIDER_SITE_OTHER): Payer: Self-pay | Admitting: *Deleted

## 2010-03-21 NOTE — Assessment & Plan Note (Signed)
Summary: np6/ sob . pul htn . gd   Visit Type:  Initial Consult Primary Provider:  Marga Melnick MD  CC:  shortness of breath.  History of Present Illness: patient is a 55 year old who is followed by [facility].  She was referred for evaluation of pulmonary hypertension.  The patient had an echo recently that showed severe RV dysfunction and a PAP of 80 mm Hg. The patient has no histoyr of chest pain.  She has a longstatnding history of SOB.  No palpitations.  No dizziness or syncope.  She continues to smoke.  Current Medications (verified): 1)  Proventil Hfa 108 (90 Base) Mcg/act  Aers (Albuterol Sulfate) 2)  Alprazolam 0.25 Mg  Tabs (Alprazolam) .... Take One Tablet Daily As Needed. 3)  Paxil Cr 37.5 Mg  Tb24 (Paroxetine Hcl) .[facility].. 1 Tab Qd 4)  Spiriva Handihaler 18 Mcg  Caps (Tiotropium Bromide Monohydrate) .... Inhale Contents of 1 Pill 30 Min After Advair Only in Am 5)  Depo Provera Injection .... Q 3 Months 6)  Dulera 200-5 Mcg/act Aero (Mometasone Furo-Formoterol Fum) .... 2 Puffs Every 12 Hrs ; Gargle & Spit After Use 7)  Claritin-D 12 Hour 5-120 Mg Xr12h-Tab (Loratadine-Pseudoephedrine) .... At Bedtime  Allergies (verified): 1)  ! Codeine Sulfate (Codeine Sulfate) 2)  ! Azithromycin (Azithromycin) 3)  ! Tetanus-Diphtheria Toxoids Td (Tetanus-Diphtheria Toxoids Td)  Past History:  Past medical, surgical, family and social histories (including risk factors) reviewed, and no changes noted (except as noted below).  Past Medical History: Reviewed history from 12/30/2007 and no changes required. NICOTINE ADDICTION (ICD-305.1) BRONCHITIS, OBSTRUCTIVE CHRONIC (ICD-491.20) OTHER DISEASES OF NASAL CAVITY AND SINUSES (ICD-478.19) DEPRESSION (ICD-311) ANXIETY (ICD-300.00) COPD (ICD-496) ALLERGIC RHINITIS, SEASONAL (ICD-477.0) HYPERGLYCEMIA  Past Surgical History: Reviewed history from 02/15/2009 and no changes required. Lumpectomy Sinus surgery Tonsillectomy Knee surgery for  torn cartilage 2003. Note : no colonoscopy ; SOC discussed  Family History: Reviewed history from 02/15/2009 and no changes required. Father: lung CA (he had quit 9 years prior), COPD, lipids Mother:Factor 5 Leiden  Deficiency , PTE(patient's coag panel normal) Siblings: sister thyroid nodule : no FH MI ,CVA  Social History: Reviewed history from 02/15/2009 and no changes required. Occupation: [employer] Divorced Current Smoker: 2 ppd  Regular exercise-no Alcohol use-yes  Review of Systems       ALl systesms reviewed.  Neg to the above problem.  Vital Signs:  Patient profile:   55 year old female Height:      65 inches Weight:      158 pounds BMI:     26.39 Pulse rate:   100 / minute BP sitting:   152 / 94  (left arm) Cuff size:   regular  Vitals Entered By: Hardin Negus, RMA (January 21, 2010 11:33 AM)  Physical Exam  Additional Exam:  Patient is in NAD at rest. O2 sat on RA is 85% HEENT:  Normocephalic, atraumatic. EOMI, PERRLA.  Neck: JVP is increased.   No thyromegaly. No bruits.  Lungs: Some decreased airflow on exam.  . No rales no wheezes.  Heart: Regular rate and rhythm. Normal S1, S2. No S3.  RV heave.     Abdomen:  Supple, nontender. Normal bowel sounds. No masses. No hepatomegaly.  Extremities:   Good distal pulses throughout. Clubbing of finger nails   Tr  lower extremity edema.  Musculoskeletal :moving all extremities.  Neuro:   alert and oriented x3.    EKG  Procedure date:  01/21/2010  Findings:      NSR.  100 bpm.  RA abnormality. Pulm dz patter.  RVH.  Impression & Recommendations:  Problem # 1:  PULMONARY HYPERTENSION (ICD-416.8) Patient with severe pulm HTN and severe RV dysfunction.  No evidence of intracardiac shunt on echo. ON exam she is hypoxic on RA.  Fingers show clubbing.  She continues to smoke I have discussed with her possible etiologies of pulm HTN.  I would like to sched a CT scan to r/o emboli.  I will asl review re RH  cath .  I would not star coumadin yet. Counselled on tobacco WIll leave on RA for now. Orders: EKG w/ Interpretation (93000)  Problem # 2:  NICOTINE ADDICTION (ICD-305.1) Counselled.  Other Orders: CT Scan  (CT Scan) CT Scan  (CT Scan)  Patient Instructions: 1)  Non-Cardiac CT scanning, (CAT scanning), is a noninvasive, special x-ray that produces cross-sectional images of the body using x-rays and a computer. CT scans help physicians diagnose and treat medical conditions. For some CT exams, a contrast material is used to enhance visibility in the area of the body being studied. CT scans provide greater clarity and reveal more details than regular x-ray exams.  we will call you with results.

## 2010-03-21 NOTE — Letter (Signed)
Summary: Out of Work  [employer], Main Office  [address]   [place], [place] [address]   Phone: [phone]  Fax: [phone]    February 12, 2010   Employee:  Sherry Bautista    To Whom It May Concern:   For Medical reasons, please excuse the above named employee from work for the following dates:  Start:    End:    If you need additional information, please feel free to contact our office.         Sincerely,    Layne Benton, RN, BSN

## 2010-03-21 NOTE — Progress Notes (Signed)
Summary: O2 order  Phone Note Call from Patient Call back at Home Phone [phone]   Caller: Patient Call For: wright Summary of Call: Pt states she needs order for O2 called in to [facility]. Initial call taken by: Darletta Moll,  March 04, 2010 8:14 AM  Follow-up for Phone Call        Called, spoke with pt.  States [facility] has not been out to her home yet to deliver o2 and states [facility] states they do not have an order.  Per EMR, order was placed.  Pt was informed will have this taken care of ASAP and have o2 delivered today.    PCC's pls help with this.  Thanks! Follow-up by: Gweneth Dimitri RN,  March 04, 2010 9:52 AM  Additional Follow-up for Phone Call Additional follow up Details #1::        pt informed that she has to use [employer] due to her ins and they will be calling her asap to set the 02 up  Additional Follow-up by: Oneita Jolly,  March 04, 2010 9:56 AM

## 2010-03-21 NOTE — Letter (Signed)
Summary: Out of Work  [employer] [facility]  [address]   [place], [place] [address]   Phone: [phone]  Fax: [phone]    March 01, 2010   Employee:  RAINA SOLE    To Whom It May Concern:   For Medical reasons, please excuse the above named employee from work for the following dates:  Start:   February 27, 2010  Return:   March 04, 2010  If you need additional information, please feel free to contact our office.         Sincerely,    Shan Levans, MD

## 2010-03-21 NOTE — Letter (Signed)
Summary: Out of Work  [employer], Main Office  [address]   [place], [place] [address]   Phone: [phone]  Fax: [phone]    February 12, 2010   Employee:  Sherry Bautista    To Whom It May Concern:   For Medical reasons, please excuse the above named employee from work for the following dates:  Start:   02/06/2010  End:   02/12/2010  RTW on 02/13/2010  If you need additional information, please feel free to contact our office.         Sincerely,    Layne Benton, RN, BSN  Appended Document: Out of Work Faxed to [name] at [phone].

## 2010-03-21 NOTE — Progress Notes (Signed)
Summary: pt needs a note for work  Phone Note Call from Patient Call back at [employer] [phone]   Caller: Patient Reason for Call: Talk to Nurse, Talk to Doctor Summary of Call: pt needs a note for today and yesterday for being out of work with her breathing Initial call taken by: Omer Jack,  February 05, 2010 9:23 AM  Follow-up for Phone Call        pt rqst note covering Friday, Monday, and Today. See that we have provided same kind of note before.  Will fax to Gwen Her at [phone] per pt request.  Follow-up by: Claris Gladden RN,  February 05, 2010 10:05 AM

## 2010-03-21 NOTE — Progress Notes (Signed)
Summary: doctor notes  Phone Note Call from Patient Call back at Home Phone [phone]   Caller: Patient Reason for Call: Talk to Nurse Summary of Call: pt need a doctor note for work. pt would like to talk to the nurse. Initial call taken by: Roe Coombs,  February 12, 2010 8:25 AM  Follow-up for Phone Call        Called patient back...she is still SOB and tired after exerting herself ,so she would like to have a note faxed to her supervisor covering her absence from 12/21 thru 12/27 and she wants to RTW on 12/28. I advised her that we have not received the report of her PFT's yet but we will notify her ASAP and that  Dr.Ross might want her to have a heart catherization. She still wants to RTW on 12/28. Will fax a note to her supervisor [name]) at [phone]. Layne Benton, RN, BSN  February 12, 2010 10:03 AM

## 2010-03-21 NOTE — Letter (Signed)
Summary: Cardiac Catheterization Instructions- [facility]  [employer], Main Office  [address]   [place], [place] [address]   Phone: [phone]  Fax: [phone]     02/20/2010 MRN: [id number]  Sherry Bautista [address] [place], [place]  [address]  Dear [name],   You are scheduled for a Cardiac Catheterization on 03/08/10 with Dr. Antoine Poche.  Please arrive to the 1st floor of the Heart and Vascular Center at [facility] at 7:30 am  on the day of your procedure. Please do not arrive before 6:30 a.m. Call the Heart and Vascular Center at [phone] if you are unable to make your appointmnet. The Code to get into the parking garage under the building is 0030. Take the elevators to the 1st floor. You must have someone to drive you home. Someone must be with you for the first 24 hours after you arrive home. Please wear clothes that are easy to get on and off and wear slip-on shoes. Do not eat or drink after midnight except water with your medications that morning. Bring all your medications and current insurance cards with you.    _x__ You may take ALL of your medications with water that morning.   The usual length of stay after your procedure is 2 to 3 hours. This can vary.  If you have any questions, please call the office at the number listed above.   Whitney Maeola Sarah RN

## 2010-03-21 NOTE — Letter (Signed)
Summary: Out of Work  [employer], Main Office  [address]   [place], [place] [address]   Phone: [phone]  Fax: [phone]    February 25, 2010   Employee:  Sherry Bautista    To Whom It May Concern:   For Medical reasons, please excuse the above named employee from work for the following dates:  Start:   02/22/2010  End:   02/26/2010  If you need additional information, please feel free to contact our office.         Sincerely,    Layne Benton, RN, BSN / Dr. Dietrich Pates  Appended Document: Out of Work faxed to [name] [phone].

## 2010-03-21 NOTE — Assessment & Plan Note (Signed)
Summary: Pulmonary Consultation   Copy to:  Dr. Dietrich Pates Primary Provider/Referring Provider:  Marga Melnick MD  CC:  Pulmonary Consult - abnormal PFT.Marland Kitchen  History of Present Illness: Pulmonary Consultation       The patient comes in today for an initial general pulmonary consultation.  The patient complains of cough, coughing up sputum, shortness of breath, shortness of breath with exertion, wheezing, chest congestion, pedal edema, nasal congestion, excessive phlegm production, heartburn, fever, fatigue, daytime hypersomnolence, and non-restorative sleep, but denies coughing up blood, chest pain at rest, chest pain with exertion, chest pain with inspiration/coughing, PND, orthopnea, sinus pressure, headache, sore throat, hoarseness, trouble swallowing, excessive snoring, gasping, choking, and insomnia.  Patient notes dyspnea with rest, ADL's, walking on a flat surface, walking up one flight of stairs, with shopping, and making beds.  The patient describes their cough as productive, with yellow sputum, scant amount, intermittent, occuring at night, occuring after meals, and occurs when lying down.  The symptoms appear worse with humidity.  The symptoms improve with bronchodilators and inhaled corticosteroids.  Evaluation to date has included PFT's and CT scan of chest.    Dx of copd  8-10 years     Preventive Screening-Counseling & Management  Alcohol-Tobacco     Smoking Status: current     Smoking Cessation Counseling: yes     Smoke Cessation Stage: contemplative     Packs/Day: 2.0     Pack years: 70  Current Medications (verified): 1)  Proventil Hfa 108 (90 Base) Mcg/act  Aers (Albuterol Sulfate) .Marland Kitchen.. 1-2 Inh Every 4-6 Hours As Needed 2)  Alprazolam 0.25 Mg  Tabs (Alprazolam) .... Take One Tablet Daily As Needed. 3)  Paxil Cr 37.5 Mg Xr24h-Tab (Paroxetine Hcl) .... Take 1 Tablet By Mouth Once A Day 4)  Spiriva Handihaler 18 Mcg  Caps (Tiotropium Bromide Monohydrate) .... Inhale  Contents of 1 Pill in The Am 5)  Depo Provera Injection .... Q 3 Months 6)  Dulera 200-5 Mcg/act Aero (Mometasone Furo-Formoterol Fum) .... 2 Puffs Every 12 Hrs ; Gargle & Spit After Use 7)  Claritin-D 12 Hour 5-120 Mg Xr12h-Tab (Loratadine-Pseudoephedrine) .... At Bedtime  Allergies (verified): 1)  ! Codeine Sulfate (Codeine Sulfate) 2)  ! Azithromycin (Azithromycin) 3)  ! Tetanus-Diphtheria Toxoids Td (Tetanus-Diphtheria Toxoids Td)  Past History:  Past medical, surgical, family and social histories (including risk factors) reviewed, and no changes noted (except as noted below).  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 Lumpectomy Sinus surgery Tonsillectomy Knee surgery for torn cartilage 2003. Note : no colonoscopy ; SOC discussed  Family History: Reviewed history from 02/15/2009 and no changes required. Father: lung CA (he had quit 9 years prior), COPD, lipids Mother:Factor 5 Leiden  Deficiency , PTE(patient's coag panel normal) Siblings: sister thyroid nodule : no FH MI ,CVA sister - asthma  Social History: Reviewed history from 02/15/2009 and no changes required. Occupation: [employer] Divorced Current Smoker: 2 ppd.  Started at age 36 Regular exercise-no Alcohol use-yes Packs/Day:  2.0 Pack years:  59  Review of Systems       The patient complains of shortness of breath with activity, shortness of breath at rest, productive cough, acid heartburn, indigestion, sore throat, nasal congestion/difficulty breathing through nose, anxiety, hand/feet swelling, change in color of mucus, and fever.  The patient denies  non-productive cough, coughing up blood, chest pain, irregular heartbeats, loss of appetite,  weight change, abdominal pain, difficulty swallowing, tooth/dental problems, headaches, sneezing, itching, ear ache, depression, joint stiffness or pain, and rash.         cyanotic nail beds  Vital Signs:  Patient profile:   55 year old female Height:      64 inches Weight:      152 pounds BMI:     26.19 O2 Sat:      79 % on Room air Temp:     98.7 degrees F oral Pulse rate:   110 / minute BP sitting:   118 / 78  (left arm) Cuff size:   regular  Vitals Entered By: Gweneth Dimitri RN (March 01, 2010 2:37 PM)  O2 Flow:  Room air  O2 Sat Comments Pt arrived to exam room with o2 sat of 79-83% RA and pulse of 110.  This was checked on pt's finger.  O2 sat was rechecked using ear probe for accuracy.  Per ear probe, o2 sat 83-85% RA.  Pt was placed on 2L cont, o2 sat increased to 92% with pulse of 100 using ear probe. Gweneth Dimitri RN  March 01, 2010 2:49 PM  CC: Pulmonary Consult - abnormal PFT. Comments Medications reviewed with patient Daytime contact number verified with patient. Gweneth Dimitri RN  March 01, 2010 2:37 PM    Physical Exam  Additional Exam:  Gen: Pleasant, well-nourished, in no distress,  normal affect ENT: No lesions,  mouth clear,  oropharynx clear, no postnasal drip Neck: No JVD, no TMG, no carotid bruits Lungs: No use of accessory muscles, no dullness to percussion, distant BS Cardiovascular: RRR, heart sounds normal, no murmur or gallops, no peripheral edema Abdomen: soft and NT, no HSM,  BS normal Musculoskeletal: No deformities, no cyanosis or clubbing Neuro: alert, non focal Skin: Warm, no lesions or rashes    CT of Chest  Procedure date:  01/23/2010  Findings:      Findings: Aorta normal caliber without aneurysm or dissection. Scattered coronary arterial and aortic atherosclerotic calcification. Pulmonary arteries patent. No evidence pulmonary embolism. Small pericardial effusion. Diffuse thickening of bilateral adrenal  glands, cannot  completely exclude nodule in left adrenal gland. Remaining visualized portions of upper abdomen unremarkable. No thoracic adenopathy. Upper normal-sized right upper lobe intralobar lymph node 10 x 7 mm image 38.   Emphysematous changes greatest at apices. Mild scattered peribronchial thickening. Tiny nonspecific 4 mm diameter right lower lobe nodule image 64. Minimal atelectasis right lung base. Nonspecific 10 x 5 mm diameter subpleural nodule posteromedial left lower lobe image 62, 7 mm length, noncalcified. No pulmonary infiltrate or pleural effusion. Thickening at base of right major fissure. No acute osseous findings.   Review of the MIP images confirms the above findings.   IMPRESSION: No evidence of pulmonary embolism. Minimal right basilar atelectasis, scattered peribronchial thickening and emphysematous changes. Nonspecific 4 mm right lower lobe and 10 x 5 x 7 mm diameter left lower lobe pulmonary nodules, of uncertain etiology and significance. If patient has prior outside CT examinations, recommend these be obtained to demonstrate stability versus interval change of the observed pulmonary nodules. In the absence prior exams, recommend follow-up CT chest in 4 months to reassess.  Pulmonary Function Test Date: 02/12/2010 Gender: Female  Pre-Spirometry FVC    Value: 1.86 L/min   Pred: 3.38 L/min     % Pred: 55 % FEV1    Value: 0.64 L  Pred: 2.55 L     % Pred: 25 % FEV1/FVC  Value: 34 %     Pred: 75 %    FEF 25-75  Value: 0.20 L/min   Pred: 2.89 L/min     % Pred: 7 %  Post-Spirometry FVC    Value: 2.33 L/min   Pred: 3.38 L/min     % Pred: 69 % FEV1    Value: 0.77 L     Pred: 2.55 L     % Pred: 30 % FEF 25-75  Value: 0/24 L/min   Pred: 2.89 L/min     % Pred: 8 %  Lung Volumes TLC    Value: 4.71 L   % Pred: 91 % RV    Value: 2.85 L   % Pred: 154 % DLCO    Value: 7.2 %   % Pred: 33 % DLCO/VA  Value: 2.22 %   % Pred: 56 %  Comments: severe reduction in DLCO  and airtrapping on lung volumes   Evaluation: severe obstruction with significant bronchodilator response  Impression & Recommendations:  Problem # 1:  COPD (ICD-496) Assessment Deteriorated 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 plan Prednisone 10mg  4 each am x 4 days, 3 x 4 days, 2 x 4 days, 1 x 4 days then stop Avelox one daily for 5days Oxygen 3liter rest, 4liter exertion initiate RX Stay on Dulera two puff twice daily Stay on Spiriva Return two weeks  Problem # 2:  HYPOXEMIA (ICD-799.02) Assessment: Deteriorated due to assessment number one start oxygen  Problem # 3:  NICOTINE ADDICTION (ICD-305.1) Assessment: Unchanged ongoing nicotine addiction plan chantix tobacco counselling spent with this pt Her updated medication list for this problem includes:    Chantix Starting Month Pak 0.5 Mg X 11 & 1 Mg X 42 Misc (Varenicline tartrate) .Marland Kitchen... Take as directed---refills are to be for the continuing pak  Orders: Tobacco use cessation intensive >10 minutes (98119)  Problem # 4:  LUNG NODULE (ICD-518.89) Assessment: Unchanged  nodules seen on 12/11 CT with ongoing smoking will re peat CT in 4 months   Orders: Radiology Referral (Radiology)  Medications Added to Medication List This Visit: 1)  Paxil Cr 37.5 Mg Xr24h-tab (Paroxetine hcl) .... Take 1 tablet by mouth once a day 2)  Spiriva Handihaler 18 Mcg Caps (Tiotropium bromide monohydrate) .... Inhale contents of 1 pill in the am 3)  Prednisone 10 Mg Tabs (Prednisone) .... Take as directed 4 each am x 4 days, 3 x 4 days, 2 x 4 days, 1 x 4 days then stop 4)  Avelox 400 Mg Tabs (Moxifloxacin hcl) .... By mouth daily 5)  Oxygen  .Marland Kitchen.. 3liter rest 4liter exertion ra spo2 79% 6)  Chantix Starting Month Pak 0.5 Mg X 11 & 1 Mg X 42 Misc (Varenicline tartrate) .... Take as directed---refills are to be for the continuing pak  Complete Medication List: 1)  Proventil Hfa 108 (90 Base) Mcg/act Aers (Albuterol  sulfate) .Marland Kitchen.. 1-2 inh every 4-6 hours as needed 2)  Alprazolam 0.25 Mg Tabs (Alprazolam) .... Take one tablet daily as needed. 3)  Paxil Cr 37.5 Mg Xr24h-tab (Paroxetine hcl) .... Take 1 tablet by mouth once a day 4)  Spiriva Handihaler 18 Mcg Caps (Tiotropium bromide monohydrate) .... Inhale contents of 1 pill in the am 5)  Depo Provera Injection  .... Q 3 months 6)  Dulera 200-5 Mcg/act Aero (Mometasone furo-formoterol fum) .... 2 puffs every 12 hrs ; gargle &  spit after use 7)  Claritin-d 12 Hour 5-120 Mg Xr12h-tab (Loratadine-pseudoephedrine) .... At bedtime 8)  Prednisone 10 Mg Tabs (Prednisone) .... Take as directed 4 each am x 4 days, 3 x 4 days, 2 x 4 days, 1 x 4 days then stop 9)  Avelox 400 Mg Tabs (Moxifloxacin hcl) .... By mouth daily 10)  Oxygen  .Marland Kitchen.. 3liter rest 4liter exertion ra spo2 79% 11)  Chantix Starting Month Pak 0.5 Mg X 11 & 1 Mg X 42 Misc (Varenicline tartrate) .... Take as directed---refills are to be for the continuing pak  Other Orders: New Patient Level V (04540) DME Referral (DME)  Patient Instructions: 1)  Prednisone 10mg  4 each am x 4 days, 3 x 4 days, 2 x 4 days, 1 x 4 days then stop 2)  Avelox one daily for 5days 3)  Oxygen 3liter rest, 4liter exertion 4)  Stay on Dulera two puff twice daily 5)  Stay on Spiriva 6)  Stop smoking ,use chantix 7)  Return two weeks Prescriptions: CHANTIX STARTING MONTH PAK 0.5 MG X 11 & 1 MG X 42  MISC (VARENICLINE TARTRATE) Take as directed---Refills are to be for the continuing pak  #1 x 2   Entered and Authorized by:   [name] MD   Signed by:   [name] MD on 03/01/2010   Method used:   Electronically to        [facility]       [address]       [place], [place]  [address]       Ph: [phone]       Fax: [phone]   RxID:   [id number] OXYGEN 3Liter rest 4Liter exertion RA spo2 79%  #1 x 0   Entered and Authorized by:   [name] MD   Signed by:   [name] MD on 03/01/2010   Method used:   Print then Give to Patient   RxID:   [id number] AVELOX 400 MG  TABS (MOXIFLOXACIN HCL) By mouth daily  #5 x 0   Entered and Authorized by:   [name] MD   Signed by:   [name] MD on 03/01/2010   Method used:   Electronically to        [facility]       [address]       [place], [place]  [address]       Ph: [phone]       Fax: [phone]   RxID:   [id number] PREDNISONE 10 MG  TABS (PREDNISONE) Take as directed 4 each am x 4 days, 3 x 4 days, 2 x 4 days, 1 x 4 days then stop  #40 x 0   Entered and Authorized by:   [name] MD   Signed by:   [name] MD on 03/01/2010   Method used:   Electronically to        [facility]       [address]       [place], [place]  [address]       Ph: [phone]       Fax: [phone]   RxID:   [id number]

## 2010-03-21 NOTE — Miscellaneous (Signed)
Summary: Orders Update-PFT charges  Clinical Lists Changes  Orders: Added new Service order of Spirometry (Pre & Post) [id number]) - Signed Added new Service order of Lung Volumes (10932) - Signed Added new Service order of Carbon Monoxide diffusing w/capacity (35573) - Signed

## 2010-03-21 NOTE — Progress Notes (Signed)
Summary: note for work  Phone Note Call from Patient Call back at [employer] [phone]   Caller: Patient Reason for Call: Talk to Nurse Summary of Call: pt needs a doctor note for work. Initial call taken by: Roe Coombs,  February 25, 2010 12:09 PM  Follow-up for Phone Call        Patient called requesting out of work note from 1/6 thru 1/10. Will fax to supervisor [name] at [phone].  Layne Benton, RN, BSN  February 25, 2010 12:37 PM

## 2010-03-21 NOTE — Miscellaneous (Signed)
  Clinical Lists Changes  Problems: Added new problem of SHORTNESS OF BREATH (ICD-786.05) Orders: Added new Referral order of Pulmonary Function Test (PFT) - Signed

## 2010-03-21 NOTE — Progress Notes (Signed)
Summary: Paroxetine refill  Phone Note Refill Request Message from:  Fax from Pharmacy on February 27, 2010 3:19 PM  Refills Requested: Medication #1:  PAXIL CR 37.5 MG  TB24 (PAROXETINE HCL) 1 tab qd [id number],  [address], [place], [place]    phone - [phone] .  fax - [phone]    qty = 30  Next Appointment Scheduled: none Initial call taken by: Jerolyn Shin,  February 27, 2010 3:25 PM    Prescriptions: PAXIL CR 37.5 MG  TB24 (PAROXETINE HCL) 1 tab qd  #30 x 5   Entered by:   Shonna Chock CMA   Authorized by:   Marga Melnick MD   Signed by:   Shonna Chock CMA on 02/27/2010   Method used:   Faxed to ...       [facility]       [address]       [place], [place]  [address]       Ph: [phone]       Fax: [phone]   RxID:   [id number]

## 2010-03-21 NOTE — Letter (Signed)
Summary: Generic Letter  [employer], Main Office  [address]   [place], [place] [address]   Phone: [phone]  Fax: [phone]        February 05, 2010 MRN: [id number]    ARIYEL JEANGILLES [address] [place], [place]  [address]    Dear Ms. Izola Price,  For medical reasons, please excuse the above named for the following dates:  December 16, 19, 20, 2011.  If you need additional information, do not hesitate to call.  Sincerely,    Claris Gladden, RN/ Dr. Dietrich Pates               Sincerely,  Claris Gladden RN  This letter has been electronically signed by your physician.

## 2010-03-22 ENCOUNTER — Encounter: Payer: Self-pay | Admitting: Internal Medicine

## 2010-03-22 ENCOUNTER — Ambulatory Visit: Admit: 2010-03-22 | Payer: Self-pay | Admitting: Internal Medicine

## 2010-03-22 ENCOUNTER — Telehealth (INDEPENDENT_AMBULATORY_CARE_PROVIDER_SITE_OTHER): Payer: Self-pay | Admitting: *Deleted

## 2010-03-27 NOTE — Letter (Signed)
Summary: Return to Work  [employer]  [address]   [place], [place] [address]   Phone: [phone]  Fax: [phone]    03/19/2010  TO: Leodis Sias IT MAY CONCERN  RE: Sherry Bautista [address] [place]   The above named individual is under my medical care and may return to work on:    If you have any further questions or need additional information, please call.     Sincerely,   Marga Melnick MD typed by: [name] MD

## 2010-03-27 NOTE — Letter (Signed)
Summary: Out of Work  [employer], Main Office  [address]   [place], [place] [address]   Phone: [phone]  Fax: [phone]    March 18, 2010   Employee:  CHRISTABELLE HANZLIK    To Whom It May Concern:   For Medical reasons, please excuse the above named employee from work for the following dates:  Start:   03/11/2010  End:   03/19/2010  If you need additional information, please feel free to contact our office.         Sincerely,    Layne Benton, RN, BSN/ Dr.Florentina Marquart Tenny Craw  Appended Document: Out of Work Faxed to [name] [phone] per pt's request.

## 2010-03-27 NOTE — Letter (Signed)
Summary: Generic Electronics engineer Pulmonary  [address]   [place], [place] [address]   Phone: [phone]  Fax: [phone]      03/21/2010    TO WHOM IT MAY CONCERN:  Re: Sherry Bautista  Patients need for oxygen does not pose a safety issue at work. You may contact my office at [phone] if you have any further questions.    Sincerely,    Shan Levans, M.D.

## 2010-03-27 NOTE — Progress Notes (Signed)
----   Converted from flag ---- Flag Received  ---- 03/22/2010 11:57 AM, Bary Leriche wrote: Loman Chroman,  I am sending Dr. Tenny Craw an FMLA for her patient [name]. [name] dob 2056/02/16.  Thank you, Elease Hashimoto at  [employer]. ------------------------------

## 2010-03-27 NOTE — Letter (Signed)
Summary: Return to Work  [employer]  [address]   [place], [place] [address]   Phone: [phone]  Fax: [phone]    03/19/2010  TO: Leodis Sias IT MAY CONCERN  RE: Sherry Bautista [address] [place]   The above named individual is under my medical care and may return to work on: at any time but the patient will need to wear portable oxygen 4liters continuous at work due to her lung condition    If you have any further questions or need additional information, please call.     Sincerely,   Marga Melnick MD typed by: [name] MD

## 2010-03-27 NOTE — Progress Notes (Addendum)
Summary: Need doc note  Phone Note Call from Patient Call back at Home Phone [phone]   Caller: Patient Summary of Call: Pt calling regarding getting a [facility] note for work Initial call taken by: Judie Grieve,  March 18, 2010 10:43 AM  Follow-up for Phone Call        Called patient back. She needs a letter faxed to her supervisor concerning missed days of work from 1/23 thru 1/31 with a date of 2/1 for  RTW faxed to  [phone].  Layne Benton, RN, BSN  March 18, 2010 2:17 PM

## 2010-03-27 NOTE — Assessment & Plan Note (Signed)
Summary: Pulmonary OV   Copy to:  Dr. Dietrich Pates Primary Provider/Referring Provider:  Marga Melnick MD  CC:  2 wk follow up.  Pt states breathing has improved with o2 however c/o chest tightness and increased SOB whne off o2 even for short periods of time x 2-3 days.  Prod cough with creamy mucus.  Denies wheezing.  Still smoking 1 1/2 - 2 ppd but plans to start chantix on Monday.Marland Kitchen  History of Present Illness: Pulmonary Consultation       The patient comes in today for an initial general pulmonary consultation.  The patient complains of cough, coughing up sputum, shortness of breath, shortness of breath with exertion, wheezing, chest congestion, pedal edema, nasal congestion, excessive phlegm production, heartburn, fever, fatigue, daytime hypersomnolence, and non-restorative sleep, but denies coughing up blood, chest pain at rest, chest pain with exertion, chest pain with inspiration/coughing, PND, orthopnea, sinus pressure, headache, sore throat, hoarseness, trouble swallowing, excessive snoring, gasping, choking, and insomnia.  Patient notes dyspnea with rest, ADL's, walking on a flat surface, walking up one flight of stairs, with shopping, and making beds.  The patient describes their cough as productive, with yellow sputum, scant amount, intermittent, occuring at night, occuring after meals, and occurs when lying down.  The symptoms appear worse with humidity.  The symptoms improve with bronchodilators and inhaled corticosteroids.  Evaluation to date has included PFT's and CT scan of chest.    Dx of copd  8-10 years   March 19, 2010 3:44 PM Pt is on oxygen now.  is better.  Now not much cough.  no real wheeze. Pt has chantix and will start.  Pt is now off avelox Pt is planning to stop smoking but still smoking now    Preventive Screening-Counseling & Management  Alcohol-Tobacco     Alcohol type: rare     Smoking Status: current     Smoking Cessation Counseling: yes     Smoke  Cessation Stage: contemplative     Packs/Day: 2.0     Pack years: 70  Current Medications (verified): 1)  Proventil Hfa 108 (90 Base) Mcg/act  Aers (Albuterol Sulfate) .Marland Kitchen.. 1-2 Inh Every 4-6 Hours As Needed 2)  Alprazolam 0.25 Mg  Tabs (Alprazolam) .... Take One Tablet Daily As Needed. 3)  Paxil Cr 37.5 Mg Xr24h-Tab (Paroxetine Hcl) .... Take 1 Tablet By Mouth Once A Day 4)  Spiriva Handihaler 18 Mcg  Caps (Tiotropium Bromide Monohydrate) .... Inhale Contents of 1 Pill in The Am 5)  Depo Provera Injection .... Q 3 Months 6)  Dulera 200-5 Mcg/act Aero (Mometasone Furo-Formoterol Fum) .... 2 Puffs Every 12 Hrs ; Gargle & Spit After Use 7)  Claritin-D 12 Hour 5-120 Mg Xr12h-Tab (Loratadine-Pseudoephedrine) .... At Bedtime 8)  Oxygen .Marland Kitchen.. 3liter Rest 4liter Exertion Ra Spo2 79% 9)  Chantix Starting Month Pak 0.5 Mg X 11 & 1 Mg X 42  Misc (Varenicline Tartrate) .... Take As Directed---Refills Are To Be For The Continuing Pak  Allergies (verified): 1)  ! Codeine Sulfate (Codeine Sulfate) 2)  ! Azithromycin (Azithromycin) 3)  ! Tetanus-Diphtheria Toxoids Td (Tetanus-Diphtheria Toxoids Td)  Past History:  Past medical, surgical, family and social histories (including risk factors) reviewed, and no changes noted (except as noted below).  Past Medical History: Reviewed history from 03/01/2010 and no changes required. NICOTINE ADDICTION (ICD-305.1) OTHER DISEASES OF NASAL CAVITY AND SINUSES (ICD-478.19) DEPRESSION (ICD-311) ANXIETY (ICD-300.00) COPD (ICD-496) ALLERGIC RHINITIS, SEASONAL (ICD-477.0) HYPERGLYCEMIA  Past Surgical History: Reviewed history from  02/15/2009 and no changes required. Lumpectomy Sinus surgery Tonsillectomy Knee surgery for torn cartilage 2003. Note : no colonoscopy ; SOC discussed  Family History: Reviewed history from 03/01/2010 and no changes required. Father: lung CA (he had quit 9 years prior), COPD, lipids Mother:Factor 5 Leiden  Deficiency ,  PTE(patient's coag panel normal) Siblings: sister thyroid nodule : no FH MI ,CVA sister - asthma  Social History: Reviewed history from 03/01/2010 and no changes required. Occupation: [employer] Divorced Current Smoker: 2 ppd.  Started at age 32 Regular exercise-no Alcohol use-yes  Review of Systems       The patient complains of shortness of breath with activity and non-productive cough.  The patient denies shortness of breath at rest, productive cough, coughing up blood, chest pain, irregular heartbeats, acid heartburn, indigestion, loss of appetite, weight change, abdominal pain, difficulty swallowing, sore throat, tooth/dental problems, headaches, nasal congestion/difficulty breathing through nose, sneezing, itching, ear ache, anxiety, depression, hand/feet swelling, joint stiffness or pain, rash, change in color of mucus, and fever.    Vital Signs:  Patient profile:   55 year old female Height:      64 inches Weight:      152.13 pounds BMI:     26.21 O2 Sat:      96 % on 3 L/mincont Temp:     97.8 degrees F oral Pulse rate:   111 / minute BP sitting:   112 / 74  (left arm) Cuff size:   regular  Vitals Entered By: Gweneth Dimitri RN (March 19, 2010 3:35 PM)  O2 Flow:  3 L/mincont CC: 2 wk follow up.  Pt states breathing has improved with o2 however c/o chest tightness and increased SOB whne off o2 even for short periods of time x 2-3 days.  Prod cough with creamy mucus.  Denies wheezing.  Still smoking 1 1/2 - 2 ppd but plans to start chantix on Monday. Comments Medications reviewed with patient Daytime contact number verified with patient. Gweneth Dimitri RN  March 19, 2010 3:36 PM    Physical Exam  Additional Exam:  Gen: Pleasant, well-nourished, in no distress,  normal affect ENT: No lesions,  mouth clear,  oropharynx clear, no postnasal drip Neck: No JVD, no TMG, no carotid bruits Lungs: No use of accessory muscles, no dullness to percussion, distant  BS Cardiovascular: RRR, heart sounds normal, no murmur or gallops, no peripheral edema Abdomen: soft and NT, no HSM,  BS normal Musculoskeletal: No deformities, no cyanosis or clubbing Neuro: alert, non focal Skin: Warm, no lesions or rashes    Impression & Recommendations:  Problem # 1:  COPD (ICD-496) Assessment Improved severe copd with ongoing smoking use plan cont oxygen therapy cont inhaled meds focus on smoking cessation rov 2 months   Complete Medication List: 1)  Proventil Hfa 108 (90 Base) Mcg/act Aers (Albuterol sulfate) .Marland Kitchen.. 1-2 inh every 4-6 hours as needed 2)  Alprazolam 0.25 Mg Tabs (Alprazolam) .... Take one tablet daily as needed. 3)  Paxil Cr 37.5 Mg Xr24h-tab (Paroxetine hcl) .... Take 1 tablet by mouth once a day 4)  Spiriva Handihaler 18 Mcg Caps (Tiotropium bromide monohydrate) .... Inhale contents of 1 pill in the am 5)  Depo Provera Injection  .... Q 3 months 6)  Dulera 200-5 Mcg/act Aero (Mometasone furo-formoterol fum) .... 2 puffs every 12 hrs ; gargle & spit after use 7)  Claritin-d 12 Hour 5-120 Mg Xr12h-tab (Loratadine-pseudoephedrine) .... At bedtime 8)  Oxygen  .Marland Kitchen.. 3liter rest 4liter  exertion ra spo2 79% 9)  Chantix Starting Month Pak 0.5 Mg X 11 & 1 Mg X 42 Misc (Varenicline tartrate) .... Take as directed---refills are to be for the continuing pak  Other Orders: Est. Patient Level V ([address]) DME Referral (DME)  Patient Instructions: 1)  You may return to work if you wear the oxygen 2)  Stop smoking 3)  Stay on inhalers 4)  Return 2 months

## 2010-03-27 NOTE — Progress Notes (Signed)
Summary: order for o2 conservor received   Phone Note Other Incoming   Caller: Cindy with [facility] Summary of Call: Arline Asp with [employer] phoned regarding an order for o2 conserving device she received this today. She justed wanted to let us know that they received the order and thank you very much they have a consult set up for tomorrow with ms. Sockwell. Arline Asp can be reached at [phone] Initial call taken by: Vedia Coffer,  March 20, 2010 11:57 AM

## 2010-03-27 NOTE — Progress Notes (Signed)
Summary: letter re: oxygen at work<<<faxed  Phone Note Call from Patient Call back at Home Phone [phone]   Caller: Patient Call For: clance Summary of Call: The pt stated she had a note to return to work but they want a note stating that her O2 at work won't be a safety issue pls advise. Initial call taken by: Darletta Moll,  March 21, 2010 4:10 PM  Follow-up for Phone Call        Pt states her employeer is requesting a letter that states her oxygen will not be a safety issue at work if it is kept in her office and not out on the floor. She says she always goes to her office if needing to use the oxygen. Pls advise.Michel Bickers [facility]  March 21, 2010 5:08 PM  Additional Follow-up for Phone Call Additional follow up Details #1::        this is ok to send  Additional Follow-up by: [name] MD,  March 21, 2010 5:10 PM    Additional Follow-up for Phone Call Additional follow up Details #2::    Letter created and faxed to [employer], AttnTresa Endo at [phone] per patient request. Patient is aware. Follow-up by: Michel Bickers CMA,  March 21, 2010 5:28 PM

## 2010-03-29 ENCOUNTER — Ambulatory Visit: Payer: Self-pay | Admitting: Internal Medicine

## 2010-04-02 ENCOUNTER — Telehealth: Payer: Self-pay | Admitting: Critical Care Medicine

## 2010-04-02 ENCOUNTER — Telehealth (INDEPENDENT_AMBULATORY_CARE_PROVIDER_SITE_OTHER): Payer: Self-pay | Admitting: *Deleted

## 2010-04-03 ENCOUNTER — Encounter: Payer: Self-pay | Admitting: Critical Care Medicine

## 2010-04-04 ENCOUNTER — Encounter: Payer: Self-pay | Admitting: Internal Medicine

## 2010-04-04 NOTE — Miscellaneous (Signed)
Summary: Order received/[facility]  Order received/[facility]   Imported By: Lester Ingalls 03/26/2010 10:33:22  _____________________________________________________________________  External Attachment:    Type:   Image     Comment:   External Document

## 2010-04-10 NOTE — Progress Notes (Signed)
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   Next Appointment Scheduled: NONE Initial call taken by: Lucious Groves CMA,  April 02, 2010 12:45 PM    Prescriptions: SPIRIVA HANDIHALER 18 MCG  CAPS (TIOTROPIUM BROMIDE MONOHYDRATE) inhale contents of 1 pill in the am  #1 x 3   Entered by:   Shonna Chock CMA   Authorized by:   Marga Melnick MD   Signed by:   Shonna Chock CMA on 04/02/2010   Method used:   [facility]ron[facility] to        [facility]       [address]       [place], [place]  [address]       Ph: [phone]       Fax: [phone]   RxID:   [id number]

## 2010-04-10 NOTE — Progress Notes (Signed)
Summary: FMLA  ---- Converted from flag ---- ---- 03/28/2010 11:57 AM, Bary Leriche wrote: Sheryn Bison, I just reveived another form regarding Dr. Florene Route patient Clearence Cheek dob 01-13-1956.  Dr. has already reviewed the [facility] and it has been faxed to the employer already, but the employer has sent another form to review.  Thank you,  Elease Hashimoto  at [employer]. ------------------------------  Phone Note Other Incoming   Summary of Call: Form signed by PW and sent back down to [employer].  Sent Elease Hashimoto a flag notifiying her of this. Initial call taken by: Gweneth Dimitri RN,  April 02, 2010 1:38 PM

## 2010-04-16 ENCOUNTER — Telehealth (INDEPENDENT_AMBULATORY_CARE_PROVIDER_SITE_OTHER): Payer: Self-pay | Admitting: *Deleted

## 2010-04-17 ENCOUNTER — Telehealth (INDEPENDENT_AMBULATORY_CARE_PROVIDER_SITE_OTHER): Payer: Self-pay | Admitting: *Deleted

## 2010-04-18 ENCOUNTER — Encounter: Payer: Self-pay | Admitting: Internal Medicine

## 2010-04-22 ENCOUNTER — Telehealth (INDEPENDENT_AMBULATORY_CARE_PROVIDER_SITE_OTHER): Payer: Self-pay | Admitting: *Deleted

## 2010-04-23 ENCOUNTER — Encounter: Payer: Self-pay | Admitting: Adult Health

## 2010-04-23 ENCOUNTER — Ambulatory Visit (INDEPENDENT_AMBULATORY_CARE_PROVIDER_SITE_OTHER): Payer: Managed Care, Other (non HMO) | Admitting: Adult Health

## 2010-04-23 DIAGNOSIS — J449 Chronic obstructive pulmonary disease, unspecified: Secondary | ICD-10-CM

## 2010-04-24 ENCOUNTER — Encounter: Payer: Self-pay | Admitting: Critical Care Medicine

## 2010-04-24 ENCOUNTER — Encounter (INDEPENDENT_AMBULATORY_CARE_PROVIDER_SITE_OTHER): Payer: Self-pay | Admitting: *Deleted

## 2010-04-25 NOTE — Progress Notes (Signed)
Summary: form confirmation  Phone Note Call from Patient Call back at Home Phone [phone]   Caller: Patient Call For: wright Summary of Call: pt wants to confirm that the fax she sent yesterday re: form was received. she needs this done asap so that she can return to work.  Initial call taken by: Tivis Ringer, CNA,  April 17, 2010 9:36 AM  Follow-up for Phone Call        Duplicate phone note taken on this.  Pls see previous phone note from 04/16/10 for additional information  Follow-up by: Gweneth Dimitri RN,  April 17, 2010 10:56 AM

## 2010-04-25 NOTE — Progress Notes (Signed)
Summary: Reasonable Accommodation Forms > sent to [employer]  Phone Note Call from Patient Call back at Home Phone [phone]   Caller: Patient Call For: [name] Summary of Call: Patient phoned stated that your employer sent her an email stating that we are completing the wrong form.  She stated that we need to fill out a Reasonable Accomadation Form and she stated that they told her they have faxed it twice. They told her that she has received the FMLA but she needs the Reasonable Accomadations form competed. She will have them refax the form. Patient can be reached at [phone]  Initial call taken by: Vedia Coffer,  April 16, 2010 4:45 PM  Follow-up for Phone Call        Spoke with pt and she states that there is still another form that PW needs to fill out for her.  She states that it is called the Reasonable Accomidation Form and it has already been faxed here twice, and they are going to fac it again today.  I advised that once form is recieved, we will address.  I am forwarding this to Crystal per her request.  Follow-up by: Vernie Murders,  April 16, 2010 4:52 PM  Additional Follow-up for Phone Call Additional follow up Details #1::        Caller: Patient Call For: wright Summary of Call: pt wants to confirm that the fax she sent yesterday re: form was received. she needs this done asap so that she can return to work.  Initial call taken by: Tivis Ringer, CNA,  April 17, 2010 9:36 AM    Additional Follow-up for Phone Call Additional follow up Details #2::    Forms have not been received yet.  States they are faxing forms to [phone].  Asked pt to pls fax to triage fax number to my attention.  Pt will have this done today. Advised if received today, will take to [employer] tomorrow to have PW address as he is out of office today.  She verbalized understanding of this. Gweneth Dimitri RN  April 17, 2010 10:57 AM  Pt called back stating her employer received a call this am  from a lady at this office stating this form was received but does not know who it was -- ? if this was sent to [employer]?  Will  call to find out.  Gweneth Dimitri RN  April 17, 2010 11:04 AM  Reece Levy, spoke with Bradly Chris.  Per Bradly Chris, they do not have any new forms of this pt.   Gweneth Dimitri RN  April 17, 2010 11:38 AM  [facility] to inform pt [employer] does not have these forms. Don't know who called stating they received them.  Pls have them refaxed to triage. Gweneth Dimitri RN  April 17, 2010 12:08 PM  Pt returned call.  States her employer will be refaxing form today to triage.  Gweneth Dimitri RN  April 17, 2010 12:19 PM    Additional Follow-up for Phone Call Additional follow up Details #3:: Details for Additional Follow-up Action Taken: Received the Reasonable Accommodation form that pt is reuqesting to be completed and sent back to employer.  Discription of pt's job duty attached.  Looks like these forms are a continuation of the FMLA forms.  Spoke with Elease Hashimoto in [employer].  She is aware I will be sending these forms down today and pt requesting this to be done asap bc she needs this in order to return to work.  Elease Hashimoto states she  will be on the watch for these and will start on them in am.  Pt aware of this.  Additional Follow-up by: Gweneth Dimitri RN,  April 17, 2010 12:32 PM

## 2010-04-25 NOTE — Cardiovascular Report (Signed)
Summary: Pre-Cath Orders  Pre-Cath Orders   Imported By: Marylou Mccoy 04/15/2010 14:49:45  _____________________________________________________________________  External Attachment:    Type:   Image     Comment:   External Document

## 2010-04-30 NOTE — Progress Notes (Signed)
Summary: Reasonable accomadation forms   Phone Note Call from Patient   Caller: Patient Call For: [name] Summary of Call: Sherry Bautista phoned and she wants to know if you have received the papers yet on the reasonable accommidations to sign.She can be reached at [phone] Initial call taken by: Sherry Bautista,  April 22, 2010 8:43 AM  Follow-up for Phone Call        Spoke with pt.  She is aware PW is out of the country until next week but states she cannot go back to work until these papers are completed.  I explained the process of FMLA forms and the reason we ask for 2 wks notice to fill these forms out.  She is aware we did not receive the forms until 04/17/10.  She is very understanding of this but just can't go back to work until this is done.  States she thinks her employer was sending them to the wrong dr at first.  [name] Monday with Sherry Bautista to see if pt can be put on TP's schedule to have these forms completed.  Per Sherry Bautista, this is ok.  Pt in agreement with this plan and OV scheduled for 04/23/10 at 10:45.   Follow-up by: Sherry Dimitri RN,  April 22, 2010 10:41 AM

## 2010-04-30 NOTE — Procedures (Signed)
Summary: Oximetry/[facility]  Oximetry/[facility]   Imported By: Lester Daviess 04/26/2010 09:41:36  _____________________________________________________________________  External Attachment:    Type:   Image     Comment:   External Document

## 2010-04-30 NOTE — Assessment & Plan Note (Signed)
Summary: NP follow up - Reasonable accomadation forms   Copy to:  Dr. Dietrich Pates Primary Provider/Referring Provider:  Marga Melnick MD  CC:  ov to have FMLA paperwork filled out.  pt states breathing is doing well and no new complaints..  History of Present Illness: Pulmonary Consultation       The patient comes in today for an initial general pulmonary consultation.  The patient complains of cough, coughing up sputum, shortness of breath, shortness of breath with exertion, wheezing, chest congestion, pedal edema, nasal congestion, excessive phlegm production, heartburn, fever, fatigue, daytime hypersomnolence, and non-restorative sleep, but denies coughing up blood, chest pain at rest, chest pain with exertion, chest pain with inspiration/coughing, PND, orthopnea, sinus pressure, headache, sore throat, hoarseness, trouble swallowing, excessive snoring, gasping, choking, and insomnia.  Patient notes dyspnea with rest, ADL's, walking on a flat surface, walking up one flight of stairs, with shopping, and making beds.  The patient describes their cough as productive, with yellow sputum, scant amount, intermittent, occuring at night, occuring after meals, and occurs when lying down.  The symptoms appear worse with humidity.  The symptoms improve with bronchodilators and inhaled corticosteroids.  Evaluation to date has included PFT's and CT scan of chest.    Dx of copd  8-10 years   March 19, 2010 3:44 PM Pt is on oxygen now.  is better.  Now not much cough.  no real wheeze. Pt has chantix and will start.  Pt is now off avelox Pt is planning to stop smoking but still smoking now   April 23, 2010--Presents for follow up and FMLA paperwork. She is feeling so much better esp on Oxygen.  She has paperwork to return back to work on O2. 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  She is working hard on quitting smoking and has quit date set in place.  Denies chest pain, orthopnea, hemoptysis,  fever, n/v/d, edema, headache.   Medications Prior to Update: 1)  Proventil Hfa 108 (90 Base) Mcg/act  Aers (Albuterol Sulfate) .Marland Kitchen.. 1-2 Inh Every 4-6 Hours As Needed 2)  Alprazolam 0.25 Mg  Tabs (Alprazolam) .... Take One Tablet Daily As Needed. 3)  Paxil Cr 37.5 Mg Xr24h-Tab (Paroxetine Hcl) .... Take 1 Tablet By Mouth Once A Day 4)  Spiriva Handihaler 18 Mcg  Caps (Tiotropium Bromide Monohydrate) .... Inhale Contents of 1 Pill in The Am 5)  Depo Provera Injection .... Q 3 Months 6)  Dulera 200-5 Mcg/act Aero (Mometasone Furo-Formoterol Fum) .... 2 Puffs Every 12 Hrs ; Gargle & Spit After Use 7)  Claritin-D 12 Hour 5-120 Mg Xr12h-Tab (Loratadine-Pseudoephedrine) .... At Bedtime 8)  Oxygen .Marland Kitchen.. 3liter Rest 4liter Exertion Ra Spo2 79% 9)  Chantix Starting Month Pak 0.5 Mg X 11 & 1 Mg X 42  Misc (Varenicline Tartrate) .... Take As Directed---Refills Are To Be For The Continuing Pak  Current Medications (verified): 1)  Proventil Hfa 108 (90 Base) Mcg/act  Aers (Albuterol Sulfate) .Marland Kitchen.. 1-2 Inh Every 4-6 Hours As Needed 2)  Alprazolam 0.25 Mg  Tabs (Alprazolam) .... Take One Tablet Daily As Needed. 3)  Paxil Cr 37.5 Mg Xr24h-Tab (Paroxetine Hcl) .... Take 1 Tablet By Mouth Once A Day 4)  Spiriva Handihaler 18 Mcg  Caps (Tiotropium Bromide Monohydrate) .... Inhale Contents of 1 Pill in The Am 5)  Depo Provera Injection .... Q 3 Months 6)  Dulera 200-5 Mcg/act Aero (Mometasone Furo-Formoterol Fum) .... 2 Puffs Every 12 Hrs ; Gargle & Spit  After Use 7)  Claritin-D 12 Hour 5-120 Mg Xr12h-Tab (Loratadine-Pseudoephedrine) .... At Bedtime 8)  Oxygen .Marland Kitchen.. 3liter Rest 4liter Exertion Ra Spo2 79% 9)  Chantix Starting Month Pak 0.5 Mg X 11 & 1 Mg X 42  Misc (Varenicline Tartrate) .... Take As Directed---Refills Are To Be For The Continuing Pak  Allergies (verified): 1)  ! Codeine Sulfate (Codeine Sulfate) 2)  ! Azithromycin (Azithromycin) 3)  ! Tetanus-Diphtheria Toxoids Td (Tetanus-Diphtheria Toxoids  Td)  Past History:  Past Medical History: Last updated: 03/01/2010 NICOTINE ADDICTION (ICD-305.1) OTHER DISEASES OF NASAL CAVITY AND SINUSES (ICD-478.19) DEPRESSION (ICD-311) ANXIETY (ICD-300.00) COPD (ICD-496) ALLERGIC RHINITIS, SEASONAL (ICD-477.0) HYPERGLYCEMIA  Past Surgical History: Last updated: 02/15/2009 Lumpectomy Sinus surgery Tonsillectomy Knee surgery for torn cartilage 2003. Note : no colonoscopy ; SOC discussed  Family History: Last updated: 03/01/2010 Father: lung CA (he had quit 9 years prior), COPD, lipids Mother:Factor 5 Leiden  Deficiency , PTE(patient's coag panel normal) Siblings: sister thyroid nodule : no FH MI ,CVA sister - asthma  Social History: Last updated: 03/01/2010 Occupation: [employer] Divorced Current Smoker: 2 ppd.  Started at age 59 Regular exercise-no Alcohol use-yes  Risk Factors: Smoking Status: current (03/19/2010) Packs/Day: 2.0 (03/19/2010)  Review of Systems      See HPI  Vital Signs:  Patient profile:   55 year old female Height:      64 inches Weight:      151.13 pounds BMI:     26.04 O2 Sat:      95 % on 4 L/min cont Temp:     99.8 degrees F oral Pulse rate:   110 / minute BP sitting:   130 / 70  (left arm) Cuff size:   regular  Vitals Entered By: Boone Master CNA/MA (April 23, 2010 10:40 AM)  O2 Flow:  4 L/min cont CC: ov to have FMLA paperwork filled out.  pt states breathing is doing well, no new complaints. Is Patient Diabetic? No Comments Medications reviewed with patient Daytime contact number verified with patient. Boone Master CNA/MA  April 23, 2010 11:06 AM   pt's o2 sats when checking vital signs was 78-84%.  pt's portable oxygen tank was empty.  placed pt on one of the office's tanks, 3L/min cont and encouraged pursed-lip breathing.  o2 sats increased to 95%.  at time of discharge from the exam room, pt refused to wait for [facility] to bring her another tank, stating that she does not live far  from the office.  advised pt to go straight home, do not rush and continue with the pursed-lip breathing. Boone Master CNA/MA  April 23, 2010 11:08 AM    Physical Exam  Additional Exam:  Gen: Pleasant, well-nourished, in no distress,  normal affect ENT: No lesions,  mouth clear,  oropharynx clear, no postnasal drip Neck: No JVD, no TMG, no carotid bruits Lungs: No use of accessory muscles, no dullness to percussion, distant BS Cardiovascular: RRR, heart sounds normal, no murmur or gallops, no peripheral edema Abdomen: soft and NT, no HSM,  BS normal Musculoskeletal: No deformities, no cyanosis or clubbing Neuro: alert, non focal Skin: Warm, no lesions or rashes    Impression & Recommendations:  Problem # 1:  COPD (ICD-496) Improved control on current regimen improved on O2.  paperwork completed in detail with pt assistance encouraged on smoking cesstation  Other Orders: Est. Patient Level III ([address])  Patient Instructions: 1)  You may return to work if you wear the oxygen  2)  Stop smoking 3)  follow up Dr. Delford Field as planned in 1 month

## 2010-04-30 NOTE — Letter (Signed)
Summary: [employer]  [address]   [place], [place] [address]   Phone: [phone]  Fax: [phone]    Today's Date: April 23, 2010  Name of Patient: Sherry Bautista  The above named patient had a medical visit today at:  10:45 am.  Please take this into consideration when reviewing the time away from work.    Special Instructions:  [  ] None  [  ] To be off the remainder of today, returning to the normal work / school schedule tomorrow.  [  ] To be off until the next scheduled appointment on ______________________.  [ X ] Other : May return to work on Wednesday April 24, 2010, full duty while wearing oxygen.     Sincerely yours,      Jacque Garrels, N.P.

## 2010-04-30 NOTE — Miscellaneous (Signed)
Summary: [employer] Forms >original to employer/copy scanned  Clinical Lists Changes     [employer] Forms filled out by TP at 3.6.12 ov.  pt took original to give to employer.  copy scanned into pt's chart. Boone Master CNA/MA  April 24, 2010 2:21 PM

## 2010-04-30 NOTE — Letter (Signed)
Summary: External Correspondence  External Correspondence   Imported By: Valinda Hoar 04/24/2010 16:25:39  _____________________________________________________________________  External Attachment:    Type:   Image     Comment:   External Document

## 2010-05-14 ENCOUNTER — Encounter: Payer: Self-pay | Admitting: Critical Care Medicine

## 2010-05-17 ENCOUNTER — Ambulatory Visit: Payer: Managed Care, Other (non HMO) | Admitting: Critical Care Medicine

## 2010-05-17 ENCOUNTER — Ambulatory Visit: Payer: Self-pay | Admitting: Critical Care Medicine

## 2010-06-05 ENCOUNTER — Ambulatory Visit: Payer: Managed Care, Other (non HMO) | Admitting: Critical Care Medicine

## 2010-06-06 ENCOUNTER — Encounter: Payer: Self-pay | Admitting: Internal Medicine

## 2010-06-19 ENCOUNTER — Encounter: Payer: Self-pay | Admitting: Critical Care Medicine

## 2010-06-21 ENCOUNTER — Ambulatory Visit: Payer: Managed Care, Other (non HMO) | Admitting: Critical Care Medicine

## 2010-07-01 ENCOUNTER — Telehealth: Payer: Self-pay | Admitting: Critical Care Medicine

## 2010-07-01 DIAGNOSIS — R911 Solitary pulmonary nodule: Secondary | ICD-10-CM

## 2010-07-01 NOTE — Telephone Encounter (Signed)
This pt needs a CT chest scheduled to f/u on nodules See orders sent to [facility]

## 2010-07-03 NOTE — Telephone Encounter (Signed)
Chest ct scheduled 07/19/10@2 :00pm

## 2010-07-05 NOTE — Consult Note (Signed)
NAME:  Sherry Bautista, Sherry Bautista NO.:  [id number]   MEDICAL RECORD NO.:  [id number]                   PATIENT TYPE:  INP   LOCATION:  1846                                 FACILITY:  [facility]   PHYSICIAN:  Genene Churn. Love, M.D.                 DATE OF BIRTH:  1955-07-17   DATE OF CONSULTATION:  05/16/2002  DATE OF DISCHARGE:                                   CONSULTATION   REASON FOR ADMISSION:  This 55 year old white married female is seen in the  emergency room and is to be admitted by Dr. Julio Sicks for evaluation of  syncope versus seizure.   HISTORY OF PRESENT ILLNESS:  The patient has a past history of asthma for 5  years, hypertension for 2 years, anxiety for at least 1 year. She has never  had any blackout episodes or seizure-like events. Today about 12:30 p.m. or  1 p.m. 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 She  reached up, noted the onset of dizziness, a sensation as if she was going to  pass out without spinning, and then had loss of consciousness. A worker  witnessed her to fall backwards, striking the occiput of her head. The  patient had some clonic or tonic activity of her extremities, but there was  no tongue biting, urinary or bowel incontinence. Afterwards she was agitated  and combative when the EMT service arrived. Her capillary blood glucose was  72 and she was brought to [facility] emergency room.   A CT scan of the brain was obtained showing 2 areas of decreased density,  one in the right extreme or external capsule, and another in the left  frontal white matter. No definite brainstem abnormalities were noted and the  cause of  these lesions was  unclear, possibly lacunar stroke or possibly  MS. She has no history of L'hermitte's sign, single eye visual loss, double  vision, clinical history of stroke, drug or alcohol abuse.   PAST MEDICAL HISTORY:  1. Chronic obstructive pulmonary disease.  2. Asthma.  3. Continued cigarette use.  4. Hypertension.  5. Obesity.  6. Anxiety.  7. Status post right knee surgery.  8. Tonsillectomy.  9. Sinus operation.  10.      Possibly pilonidal cyst surgery.  11.      She has had no injuries or other hospitalizations.   HABITS:  She does not use alcohol.   SOCIAL HISTORY:  Educational history, she finished 12th grade in school. She  works as a Electronics engineer at [employer]. She does smoke cigarettes  approximately 2 packs per day which she has done for 20 years. She lives  with her husband in [place], [place]. She has no children of her  own.   FAMILY HISTORY:  Her mother is 29, living well. Her father died at 75 from  chronic renal  failure.  She has sisters 46 and 22, one of whom at age 53 has  multiple sclerosis.   PHYSICAL EXAMINATION:  A well developed, pleasant white female who is  somewhat lethargic after receiving IV Ativan. She followed 1, 2 and 3 step  commands. There is no evidence of any aphasia. Cranial nerve examination  revealed visual fields full, disks flat, extraocular movements full. There  is a mild right ptosis. Hearing was intact. Air conduction was greater than  bone conduction with no 7th nerve palsy. Tongue was midline. The uvula was  midline. Gags were present. Motor examination revealed good strength in the  upper and lower extremities. Coordination test revealed good finger-to-nose,  good heel-to-shin. She had a outstretched hand and arm tremor. Sensory  examination was intact to pinprick testing, positioning and vibration  testing. Deep tendon reflexes were 2+ in the upper extremities, 3+ in the  lower extremities and plantar responses were downgoing.   LABORATORY DATA:  An EKG showed a prolonged QT interval and incomplete right  bundle branch block. A CT scan of the brain showed a right streaming capsule  and left frontal white matter lesions, possibly representing stroke.   White blood cell count was  elevated at 6100. A CPK has not returned at this  time. Hematocrit was 48, platelets 214,000. Sodium 133, potassium 3.8,  chloride 100, CO2 content 27,  BUN 7, creatinine 0.7, glucose 168, calcium  9.1.   IMPRESSION:  1. Syncope versus seizure, code 780.2 versus 345.1.  2. Abnormal CT scan, possibly representing white matter disease from either     stroke or demyelinating disease, code 794.09.  3. Hypertension, code 706.2.  4. Chronic obstructive pulmonary disease, code 496.  5. Obesity, code 278.01.  6. Anxiety, code 300.00.  7. Asthma, code 493.90.   PLAN:  The plan at this time is to obtain an EEG, MRI study and intracranial  MRA.                                               Genene Churn. Sandria Manly, M.D.    JML/MEDQ  D:  05/16/2002  T:  05/17/2002  Job:  [id number]

## 2010-07-05 NOTE — Discharge Summary (Signed)
NAME:  Sherry Bautista, Sherry Bautista NO.:  [id number]   MEDICAL RECORD NO.:  [id number]                   PATIENT TYPE:  INP   LOCATION:  2014                                 FACILITY:  [facility]   PHYSICIAN:  Lazaro Arms, M.D.        DATE OF BIRTH:  1955/03/06   DATE OF ADMISSION:  05/16/2002  DATE OF DISCHARGE:  05/18/2002                                 DISCHARGE SUMMARY   DISCHARGE DIAGNOSIS:  1. Syncope versus seizure, requires seizure precautions.  2. Tobacco abuse.   DISCHARGE MEDICATIONS:  1. Nicotine patch 14 mg a day, change daily.  2. Aspirin 325 mg a day.  3. Advair daily.  4. Paxil 25 mg daily.  5. Hyzaar 125 mg daily.  6. Depo-Provera 2-3 months.   ALLERGIES:  1. Codeine.  2. Erythromycin cause rash and itching.   PROCEDURE:  None.   HISTORY OF PRESENT ILLNESS:  Patient with a history of seizures in her  family (Father) who presents with a syncopal episode.  No antecedent  respiratory or cardiac symptoms per EMS.  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  Co-workers witnessed seizure-  type activity for a few seconds per EMS records.  CBG per EMS was 72.  Monitor indicated sinus tachycardia.  The patient was flushed with warm dry  skin.  Alert and oriented x 4 in the field.  She is admitted for further  evaluation and treatment of possible seizure disorder.   HOSPITAL COURSE:  Patient is admitted to telemetry bed, provided with  seizure precautions, her regular outpatient medications and neurology  consult provided by Dr. Sandria Manly.   MRI, MRA and EEG were preformed.  MRI was reviewed by Dr. Hyman Hopes which showed  no acute stroke, some old small vessel disease changes in the white matter.  These findings are not indicative of stroke or multiple sclerosis.  The  patient's EEG was normal.   Two-D echocardiogram was performed to rule out thrombus.  This revealed no  wall motion abnormalities, no source  of thrombus and ejection fraction of 55-  65%.   Bilateral carotid ultrasounds were performed, which showed no ICA stenosis  and antegrade vertebral artery flow.   The patient did not have any further episodes while in the hospital.  It is  thought that she may have had a vasovagal episode which caused her syncope  secondary to the patient being a Clinical cytogeneticist at [employer] and she  frequently bends over for long periods of time and then stands abruptly  upward.  Dr. Imagene Gurney recommendation is that the patient take aspirin for  cerebrovascular disease prophylaxis.   This patient did receive smoking cessation consult during this admission.  She was noted to be very motivated to quit smoking.  She had had some  success with Zyban in the past and was encouraged to discuss using Zyban as  well as  nicotine replacement patches with her primary care physician.   At the time of discharge the patient feels well.  She denies any  headache,  dizziness, syncope or seizure-like activity.  Does report some unsteadiness  immediately after standing, which resolves within a few seconds.  Temperature 99.1, blood pressure 114/74, pulse 84, respirations 20, room air  saturations are 92%, telemetry shows a sinus rhythm.   DISCHARGE LABS:  White blood cell count 16.1, hemoglobin 16.4, hematocrit  48.0, platelet count 214,000.  Sodium 133, potassium 3.8, glucose 168, BUN  7, creatinine 0.7.  Cardiac enzymes were negative x 3.  TSH 2.399.   UA was negative for bacteria, glucose, ketones or bilirubin.   CONSULTATIONS:  Dr. Sandria Manly of neurology.   CONDITION ON DISCHARGE:  Good.   DISPOSITION:  Discharged to home.   FOLLOW UP:  Follow up is to be with Dr. Sharlet Salina at the beginning of  next week and she is to schedule herself an appointment with Dr. Sandria Manly in  three weeks.  When she sees Dr. Marny Lowenstein she will need a fasting cholesterol  checked and homocysteine levels checked.     Ellender Hose. Earlene Plater,  N.P.                    Lazaro Arms, M.D.    SMD/MEDQ  D:  05/23/2002  T:  05/24/2002  Job:  [id number]   cc:   Genene Churn. Love, M.D.  [address]  [address]  [place]  [place] [address]  Fax: [phone]   Sharlet Salina, M.D.  [address]  [place] [address]  Fax: [phone]

## 2010-07-19 ENCOUNTER — Ambulatory Visit: Payer: Managed Care, Other (non HMO) | Admitting: Critical Care Medicine

## 2010-07-30 ENCOUNTER — Other Ambulatory Visit: Payer: Self-pay | Admitting: Internal Medicine

## 2010-08-12 ENCOUNTER — Other Ambulatory Visit: Payer: Managed Care, Other (non HMO)

## 2010-08-12 ENCOUNTER — Encounter: Payer: Self-pay | Admitting: Critical Care Medicine

## 2010-08-12 ENCOUNTER — Telehealth: Payer: Self-pay | Admitting: Critical Care Medicine

## 2010-08-12 NOTE — Telephone Encounter (Signed)
Let pt know she had small nodule in LLL we were following  Will discuss at next ov

## 2010-08-12 NOTE — Telephone Encounter (Signed)
FYI pt cancelled CT and did not wish to reschedule at this time. Carron Curie, CMA

## 2010-08-13 ENCOUNTER — Ambulatory Visit: Payer: Managed Care, Other (non HMO) | Admitting: Critical Care Medicine

## 2010-08-13 ENCOUNTER — Other Ambulatory Visit: Payer: Self-pay | Admitting: Internal Medicine

## 2010-08-14 MED ORDER — PAROXETINE HCL ER 37.5 MG PO TB24: 37.5000 mg | ORAL_TABLET | Freq: Every day | ORAL | Status: DC

## 2010-08-14 NOTE — Telephone Encounter (Signed)
done

## 2010-09-04 ENCOUNTER — Telehealth: Payer: Self-pay | Admitting: Internal Medicine

## 2010-09-04 MED ORDER — PAROXETINE HCL ER 37.5 MG PO TB24: 37.5000 mg | ORAL_TABLET | Freq: Every day | ORAL | Status: DC

## 2010-09-04 NOTE — Telephone Encounter (Signed)
Sent in. Has pending appt 09/26/10

## 2010-09-09 ENCOUNTER — Ambulatory Visit (INDEPENDENT_AMBULATORY_CARE_PROVIDER_SITE_OTHER)
Admission: RE | Admit: 2010-09-09 | Discharge: 2010-09-09 | Disposition: A | Discharge status: Home / Self Care | Payer: Managed Care, Other (non HMO) | Source: Ambulatory Visit | Attending: Critical Care Medicine | Admitting: Critical Care Medicine

## 2010-09-09 DIAGNOSIS — R911 Solitary pulmonary nodule: Secondary | ICD-10-CM

## 2010-09-09 DIAGNOSIS — J984 Other disorders of lung: Secondary | ICD-10-CM

## 2010-09-10 ENCOUNTER — Telehealth: Payer: Self-pay | Admitting: Critical Care Medicine

## 2010-09-10 NOTE — Progress Notes (Signed)
Quick Note:  Spoke with pt. She is aware of CT Chest results and recs per PW. She verbalized understanding of these instructions and voiced no further questions/concerns at this time. ______

## 2010-09-10 NOTE — Telephone Encounter (Signed)
Notes Recorded by Raford Pitcher, RN on 09/10/2010 at 10:54 AM [facility] ------  Notes Recorded by Shan Levans, MD on 09/09/2010 at 5:00 PM I tried to call the pt, no one home, I left a msg that stated all nodules gone except small nodule in LLL This can be watched. No further CT Chest needed   Called, spoke with pt.  She was informed of CT Chest results and recs as stated above per PW.  She verbalized understanding of this.

## 2010-09-16 ENCOUNTER — Encounter: Payer: Self-pay | Admitting: Critical Care Medicine

## 2010-09-16 ENCOUNTER — Ambulatory Visit (INDEPENDENT_AMBULATORY_CARE_PROVIDER_SITE_OTHER): Payer: Managed Care, Other (non HMO) | Admitting: Critical Care Medicine

## 2010-09-16 VITALS — BP 128/86 | HR 86 | Temp 98.0°F | Ht 64.0 in | Wt 142.8 lb

## 2010-09-16 DIAGNOSIS — J449 Chronic obstructive pulmonary disease, unspecified: Secondary | ICD-10-CM

## 2010-09-16 MED ORDER — LORATADINE-PSEUDOEPHEDRINE ER 5-120 MG PO TB12: 1.0000 | ORAL_TABLET | Freq: Every day | ORAL | Status: DC

## 2010-09-16 MED ORDER — MOMETASONE FURO-FORMOTEROL FUM 200-5 MCG/ACT IN AERO: 2.0000 | INHALATION_SPRAY | Freq: Two times a day (BID) | RESPIRATORY_TRACT | Status: DC

## 2010-09-16 MED ORDER — ALBUTEROL 90 MCG/ACT IN AERS: 2.0000 | INHALATION_SPRAY | Freq: Four times a day (QID) | RESPIRATORY_TRACT | Status: DC | PRN

## 2010-09-16 MED ORDER — TIOTROPIUM BROMIDE MONOHYDRATE 18 MCG IN CAPS: 18.0000 ug | ORAL_CAPSULE | Freq: Every day | RESPIRATORY_TRACT | Status: DC

## 2010-09-16 NOTE — Patient Instructions (Signed)
Refills sent to pharmacy Focus on smoking cessation with Nicoderm CQ system 21mg  daily, supplement with Nicorette 4mg  gum minis Return 4 months , make sure you get a flu vaccine this fall

## 2010-09-16 NOTE — Progress Notes (Signed)
Subjective:    Patient ID: Sherry Bautista, female    DOB: June 14, 1955, 55 y.o.   MRN: [id number]  HPI 55 y.o.WF   Dx of copd 8-10 years  March 19, 2010 3:44 PM  Pt is on oxygen now. is better. Now not much cough. no real wheeze.  Pt has chantix and will start. Pt is now off avelox  Pt is planning to stop smoking but still smoking now  April 23, 2010--Presents for follow up and FMLA paperwork. She is feeling so much better esp on Oxygen. She has paperwork to return back to work on O2. 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 She is working hard on quitting smoking and has quit date set in place. Denies chest pain, orthopnea, hemoptysis, fever, n/v/d, edema, headache.   7/30 Copd f/u Congestion in am or late pm.  Mucus is cream color.  No real chest pain.  No real wheezing.  No real edema in feet.  No nasal drip. No heartburn.   Review of Systems Constitutional:   No  weight loss, night sweats,  Fevers, chills, fatigue, lassitude. HEENT:   No headaches,  Difficulty swallowing,  Tooth/dental problems,  Sore throat,                No sneezing, itching, ear ache, nasal congestion, post nasal drip,   CV:  No chest pain,  Orthopnea, PND, swelling in lower extremities, anasarca, dizziness, palpitations  GI  No heartburn, indigestion, abdominal pain, nausea, vomiting, diarrhea, change in bowel habits, loss of appetite  Resp: Notes  shortness of breath with exertion not at rest.  No excess mucus, notes  productive cough,  No non-productive cough,  No coughing up of blood.  No change in color of mucus.  No wheezing.  No chest wall deformity  Skin: no rash or lesions.  GU: no dysuria, change in color of urine, no urgency or frequency.  No flank pain.  MS:  No joint pain or swelling.  No decreased range of motion.  No back pain.  Psych:  No change in mood or affect. No depression or anxiety.  No memory loss.     Objective:   Physical Exam  Filed Vitals:   09/16/10 0934  BP:  128/86  Pulse: 86  Temp: 98 F (36.7 C)  TempSrc: Oral  Height: 5\' 4"  (1.626 m)  Weight: 142 lb 12.8 oz (64.774 kg)  SpO2: 100%    Gen: Pleasant, well-nourished, in no distress,  normal affect  ENT: No lesions,  mouth clear,  oropharynx clear, no postnasal drip  Neck: No JVD, no TMG, no carotid bruits  Lungs: No use of accessory muscles, no dullness to percussion, clear without rales or rhonchi  Cardiovascular: RRR, heart sounds normal, no murmur or gallops, no peripheral edema  Abdomen: soft and NT, no HSM,  BS normal  Musculoskeletal: No deformities, no cyanosis or clubbing  Neuro: alert, non focal  Skin: Warm, no lesions or rashes  CT Chest 7/12 IMPRESSION:  Stable left lower lobe medial aspect pleural parenchymal nodule.  The other previously seen nodules/intrapulmonary lymph nodes are no  longer identified. No new worrisome finding. Emphysematous  changes are again noted.  Although subpleural nodules may have a lower incidence of  malignancy than intra parenchymal nodules, given the history of  smoking and the left lower lobe nodule's size, I would recommend  PET CT for further evaluation.       Assessment & Plan:   COPD Copd  Golds III  Plan No change in inhaled or maintenance medications. Return in 4 months Focus on smoking cessation Use nicoderm CQ system

## 2010-09-17 NOTE — Assessment & Plan Note (Signed)
Copd Golds III  Plan No change in inhaled or maintenance medications. Return in 4 months Focus on smoking cessation Use nicoderm CQ system

## 2010-09-26 ENCOUNTER — Encounter: Payer: Self-pay | Admitting: Internal Medicine

## 2010-09-26 ENCOUNTER — Ambulatory Visit (INDEPENDENT_AMBULATORY_CARE_PROVIDER_SITE_OTHER): Payer: Managed Care, Other (non HMO) | Admitting: Internal Medicine

## 2010-09-26 DIAGNOSIS — Z87898 Personal history of other specified conditions: Secondary | ICD-10-CM | POA: Insufficient documentation

## 2010-09-26 DIAGNOSIS — R599 Enlarged lymph nodes, unspecified: Secondary | ICD-10-CM

## 2010-09-26 DIAGNOSIS — E041 Nontoxic single thyroid nodule: Secondary | ICD-10-CM

## 2010-09-26 DIAGNOSIS — Z0000 Encounter for general adult medical examination without abnormal findings: Secondary | ICD-10-CM

## 2010-09-26 DIAGNOSIS — J449 Chronic obstructive pulmonary disease, unspecified: Secondary | ICD-10-CM

## 2010-09-26 DIAGNOSIS — R590 Localized enlarged lymph nodes: Secondary | ICD-10-CM

## 2010-09-26 DIAGNOSIS — Z8709 Personal history of other diseases of the respiratory system: Secondary | ICD-10-CM

## 2010-09-26 DIAGNOSIS — D689 Coagulation defect, unspecified: Secondary | ICD-10-CM

## 2010-09-26 LAB — BASIC METABOLIC PANEL
BUN: 9 mg/dL (ref 6–23)
Chloride: 101 mEq/L (ref 96–112)
Potassium: 4.1 mEq/L (ref 3.5–5.1)
Sodium: 139 mEq/L (ref 135–145)

## 2010-09-26 LAB — CBC WITH DIFFERENTIAL/PLATELET
Basophils Relative: 0.8 % (ref 0.0–3.0)
Eosinophils Relative: 1.1 % (ref 0.0–5.0)
HCT: 46 % (ref 36.0–46.0)
Hemoglobin: 15.4 g/dL — ABNORMAL HIGH (ref 12.0–15.0)
Lymphs Abs: 2 10*3/uL (ref 0.7–4.0)
MCV: 101.2 fl — ABNORMAL HIGH (ref 78.0–100.0)
Monocytes Absolute: 0.7 10*3/uL (ref 0.1–1.0)
Neutro Abs: 4.3 10*3/uL (ref 1.4–7.7)
RBC: 4.55 Mil/uL (ref 3.87–5.11)
WBC: 7.1 10*3/uL (ref 4.5–10.5)

## 2010-09-26 LAB — HEPATIC FUNCTION PANEL
Albumin: 4.2 g/dL (ref 3.5–5.2)
Bilirubin, Direct: 0.1 mg/dL (ref 0.0–0.3)
Total Protein: 6.8 g/dL (ref 6.0–8.3)

## 2010-09-26 LAB — TSH: TSH: 0.82 u[IU]/mL (ref 0.35–5.50)

## 2010-09-26 LAB — LIPID PANEL
HDL: 56.4 mg/dL (ref >39.00)
LDL Cholesterol: 120 mg/dL — ABNORMAL HIGH (ref 0–99)
Total CHOL/HDL Ratio: 3
Triglycerides: 87 mg/dL (ref 0.0–149.0)

## 2010-09-26 NOTE — Progress Notes (Signed)
Subjective:    Patient ID: Sherry Bautista, female    DOB: 1955/06/06, 55 y.o.   MRN: [id number]  HPI  Sherry Bautista  is here for a physical; she denies acute issues. She sees Dr Danise Mina for her COPD.     Review of Systems Patient reports no vision/ hearing  changes, adenopathy,fever,   persistant / recurrent hoarseness , swallowing issues, chest pain,palpitations,edema,persistant /recurrent cough, hemoptysis, dyspnea( rest/ exertional/paroxysmal nocturnal), gastrointestinal bleeding(melena, rectal bleeding), abdominal pain, significant heartburn,  bowel changes,GU symptoms(dysuria, hematuria,pyuria, incontinence), Gyn symptoms(abnormal  bleeding , pain),  syncope, focal weakness, memory loss,numbness & tingling, skin/hair /nail changes, or abnormal bruising or bleeding.Anxiety &  Depression controlled with meds. 15# weight loss over 12 months; ? Diet change related.      Objective:   Physical Exam Gen.: Clinical COPD stigmata but overall well-nourished in appearance. Appropriate and cooperative throughout exam. Head: Normocephalic without obvious abnormalities. Eyes: No corneal or conjunctival inflammation noted. Pupils equal round reactive to light and accommodation. Fundal exam is benign without hemorrhages, exudate, papilledema. Extraocular motion intact. Vision grossly normal. Ears: External  ear exam reveals no significant lesions or deformities. Canals clear .TMs normal. Hearing is grossly normal bilaterally. Nose: External nasal exam reveals no deformity or inflammation. Nasal mucosa are pink and moist. No lesions or exudates noted. Mouth: Oral mucosa and oropharynx reveal no lesions or exudates. Teeth in good repair. Hoarse slightly Neck: No deformities, masses, or tenderness noted. Range of motion &. Thyroid :physiologic asymmetry. Lungs: Normal respiratory effort; chest expands symmetrically. Lungs are clear to auscultation without rales, wheezes, or increased work of breathing. Decreased  BS Heart: Normal rate and rhythm. Normal S1 and S2. No gallop, click, or rub. S4 w/o  Murmur. Heart sounds distant Abdomen: Bowel sounds normal; abdomen soft and nontender. No masses, organomegaly or hernias noted. Genitalia: Debbora Dus , NP(seen 10/11)   .                                                                                   Musculoskeletal/extremities: No deformity or scoliosis noted of  the thoracic or lumbar spine but R thoracic muscles larger than L. Clubbing w/o  cyanosis, edema, or deformity noted. Range of motion  normal .Tone & strength  normal.Joints normal. Nail health  good. Vascular: Carotid, radial artery, dorsalis pedis and  posterior tibial pulses are full and equal. No bruits present. Neurologic: Alert and oriented x3. Deep tendon reflexes symmetrical and normal.  Hand tremor.        Skin: Intact without suspicious lesions or rashes. Lymph: No cervical  lymphadenopathy present. Small R axillary lymph node ( Note: mammograms due 09/12) Psych: Mood and affect are normal. Normally interactive  Assessment & Plan:  #1 comprehensive physical exam; no acute findings #2 see Problem List with Assessments & Recommendations  #3 weight loss attributed to diet change  #4 small right axillary lymph node  #5 past history of thyroid nodule; no nodule palpable on exam  #6 past history breast lumpectomy   Plan: see Orders   Copies of labs will be sent to her. I will ask her to make an appointment to see Debbora Dus, Nurse Practitioner

## 2010-09-26 NOTE — Patient Instructions (Addendum)
Preventive Health Care: Recommended lifestyle interventions for Osteoporosis include calcium 600 mg twice a day  & vitamin D3 supplementation to keep vit D  level @ least 40-60. The usual vitamin D3 dose is 1000 IU daily; but individual dose is determined by annual vitamin D level monitor. Also weight bearing exercise such as  walking 30-45 minutes 3-4  X per week is recommended. Eat a low-fat diet with lots of fruits and vegetables, up to 7-9 servings per day.  Health Care Power of Attorney & Living Will place you in charge of your health care  decisions. Verify these are  in place. Please think about quitting smoking. Review the risks we discussed. Please call 1-800-QUIT-NOW [phone]) for free smoking cessation counseling.Consider  [facility]'s smoking cessation program @ www.[facility].com or 310-722-1732.

## 2010-10-01 ENCOUNTER — Other Ambulatory Visit (HOSPITAL_BASED_OUTPATIENT_CLINIC_OR_DEPARTMENT_OTHER): Payer: Managed Care, Other (non HMO)

## 2010-10-02 ENCOUNTER — Other Ambulatory Visit (HOSPITAL_BASED_OUTPATIENT_CLINIC_OR_DEPARTMENT_OTHER): Payer: Managed Care, Other (non HMO)

## 2010-10-03 ENCOUNTER — Other Ambulatory Visit: Payer: Self-pay | Admitting: Internal Medicine

## 2010-10-03 ENCOUNTER — Other Ambulatory Visit (HOSPITAL_BASED_OUTPATIENT_CLINIC_OR_DEPARTMENT_OTHER): Payer: Managed Care, Other (non HMO)

## 2010-10-03 DIAGNOSIS — J449 Chronic obstructive pulmonary disease, unspecified: Secondary | ICD-10-CM

## 2010-10-03 MED ORDER — TIOTROPIUM BROMIDE MONOHYDRATE 18 MCG IN CAPS: 18.0000 ug | ORAL_CAPSULE | Freq: Every day | RESPIRATORY_TRACT | Status: DC

## 2010-10-03 NOTE — Telephone Encounter (Signed)
RX sent to pharmacy  

## 2010-10-07 ENCOUNTER — Other Ambulatory Visit (HOSPITAL_BASED_OUTPATIENT_CLINIC_OR_DEPARTMENT_OTHER): Payer: Managed Care, Other (non HMO)

## 2010-10-10 ENCOUNTER — Ambulatory Visit (HOSPITAL_BASED_OUTPATIENT_CLINIC_OR_DEPARTMENT_OTHER)
Admission: RE | Admit: 2010-10-10 | Discharge: 2010-10-10 | Disposition: A | Discharge status: Home / Self Care | Payer: Managed Care, Other (non HMO) | Source: Ambulatory Visit | Attending: Internal Medicine | Admitting: Internal Medicine

## 2010-10-10 DIAGNOSIS — E041 Nontoxic single thyroid nodule: Secondary | ICD-10-CM

## 2010-10-10 DIAGNOSIS — E049 Nontoxic goiter, unspecified: Secondary | ICD-10-CM | POA: Insufficient documentation

## 2010-10-25 ENCOUNTER — Other Ambulatory Visit: Payer: Self-pay | Admitting: Internal Medicine

## 2010-10-30 ENCOUNTER — Other Ambulatory Visit: Payer: Self-pay | Admitting: Internal Medicine

## 2011-01-24 ENCOUNTER — Ambulatory Visit: Payer: Managed Care, Other (non HMO) | Admitting: Critical Care Medicine

## 2011-02-10 ENCOUNTER — Telehealth: Payer: Self-pay | Admitting: Critical Care Medicine

## 2011-02-10 NOTE — Telephone Encounter (Signed)
Called and spoke with pt and she called out of work this am due to her asthma---she was requesting a work note for today.  Explained to the pt that we have to see her in order to give a work note.  Offered her appt with MW at 69 but pt refused and stated that she has appt in [place] with PW and will just go to the urgent care by her home.  Pt stated that she did not feel like driving very far.  Pt will keep her appt in jan with PW.

## 2011-02-19 ENCOUNTER — Ambulatory Visit: Payer: Managed Care, Other (non HMO) | Admitting: Critical Care Medicine

## 2011-02-20 ENCOUNTER — Other Ambulatory Visit: Payer: Self-pay | Admitting: Internal Medicine

## 2011-02-20 MED ORDER — ALPRAZOLAM 0.25 MG PO TABS: 0.2500 mg | ORAL_TABLET | Freq: Every day | ORAL | Status: DC | PRN

## 2011-02-20 NOTE — Telephone Encounter (Signed)
RX sent

## 2011-02-24 ENCOUNTER — Other Ambulatory Visit: Payer: Self-pay | Admitting: Internal Medicine

## 2011-03-18 ENCOUNTER — Telehealth: Payer: Self-pay

## 2011-03-18 NOTE — Telephone Encounter (Signed)
Left message to call office

## 2011-03-18 NOTE — Telephone Encounter (Signed)
Message left message on voicemail: patient would like for Dr.Hopper to increase Paxil due to increased anxiety.   Dr.Hopper please advise if med to be increased or if patient to have appointment to discuss

## 2011-03-18 NOTE — Telephone Encounter (Signed)
Who prescribed this form of Paxil; I only use the immediate release not a sustained release form

## 2011-03-19 ENCOUNTER — Encounter: Payer: Self-pay | Admitting: Critical Care Medicine

## 2011-03-19 ENCOUNTER — Ambulatory Visit (INDEPENDENT_AMBULATORY_CARE_PROVIDER_SITE_OTHER): Payer: Managed Care, Other (non HMO) | Admitting: Critical Care Medicine

## 2011-03-19 ENCOUNTER — Encounter: Payer: Self-pay | Admitting: *Deleted

## 2011-03-19 VITALS — BP 118/70 | HR 110 | Temp 98.2°F | Ht 64.0 in | Wt 137.2 lb

## 2011-03-19 DIAGNOSIS — J4489 Other specified chronic obstructive pulmonary disease: Secondary | ICD-10-CM

## 2011-03-19 DIAGNOSIS — F17200 Nicotine dependence, unspecified, uncomplicated: Secondary | ICD-10-CM

## 2011-03-19 DIAGNOSIS — J449 Chronic obstructive pulmonary disease, unspecified: Secondary | ICD-10-CM

## 2011-03-19 DIAGNOSIS — Z87898 Personal history of other specified conditions: Secondary | ICD-10-CM

## 2011-03-19 DIAGNOSIS — Z8709 Personal history of other diseases of the respiratory system: Secondary | ICD-10-CM

## 2011-03-19 MED ORDER — NICOTINE 10 MG IN INHA: RESPIRATORY_TRACT | Status: AC

## 2011-03-19 MED ORDER — ALBUTEROL SULFATE HFA 108 (90 BASE) MCG/ACT IN AERS: 2.0000 | INHALATION_SPRAY | RESPIRATORY_TRACT | Status: DC | PRN

## 2011-03-19 NOTE — Assessment & Plan Note (Signed)
Moderate copd with ongoing smoking  Plan Smoking cessation with nicotrol Continue oxygen therapy No change in inhaled or maintenance medications. Return in  6 months Letters for mailbox movement/ work release

## 2011-03-19 NOTE — Patient Instructions (Signed)
No change in medications. Return in           6 months USe nicotrol inhaler to quit smoking

## 2011-03-19 NOTE — Assessment & Plan Note (Signed)
Last CT 5/12: no new nodules, resolution of old nodules

## 2011-03-19 NOTE — Progress Notes (Signed)
Subjective:    Patient ID: Sherry Bautista, female    DOB: 06-Jun-1955, 56 y.o.   MRN: [id number]  HPI  56 y.o.WF   Dx of copd 8-10 years   03/19/2011 Had a URI and saw PCP 2 weeks ago. Rx pred and Avelox.  This helped.  Had mucus and dyspnea and rattling.  Now no chest pain.  No wheezing.  Dyspnea with exertion off oxygen Uses pulse system.  Liquid system will freeze up. Pt denies any significant sore throat, nasal congestion or excess secretions, fever, chills, sweats, unintended weight loss, pleurtic or exertional chest pain, orthopnea PND, or leg swelling Pt denies any increase in rescue therapy over baseline, denies waking up needing it or having any early am or nocturnal exacerbations of coughing/wheezing/or dyspnea. Pt also denies any obvious fluctuation in symptoms with  weather or environmental change or other alleviating or aggravating factors  Past Medical History  Diagnosis Date  . Current smoker   . Depression   . Anxiety   . COPD (chronic obstructive pulmonary disease)   . Allergic rhinitis     allergy shots  w/o significant response  . Hyperglycemia      Family History  Problem Relation Age of Onset  . Lung cancer Father   . COPD Father   . Hyperlipidemia Father   . Factor IX deficiency Mother     PTE  . Asthma Sister      History   Social History  . Marital Status: Legally Separated    Spouse Name: N/A    Number of Children: N/A  . Years of Education: N/A   Occupational History  . [employer]    Social History Main Topics  . Smoking status: Current Everyday Smoker -- 1.0 packs/day    Types: Cigarettes  . Smokeless tobacco: Never Used  . Alcohol Use: Yes     very rarely  . Drug Use: Not on file  . Sexually Active: Not on file   Other Topics Concern  . Not on file   Social History Narrative  . No narrative on file     Allergies  Allergen Reactions  . Azithromycin     rash  . Tetanus-Diphtheria Toxoids     Redness, swelling, stiffness  of arm   . Codeine Sulfate     itching     Outpatient Prescriptions Prior to Visit  Medication Sig Dispense Refill  . ALPRAZolam (XANAX) 0.25 MG tablet Take 1 tablet (0.25 mg total) by mouth daily as needed.  30 tablet  0  . AMBULATORY NON FORMULARY MEDICATION Oxygen: 3 liters at rest and 4 liters with activity       . DULERA 200-5 MCG/ACT AERO USE 2 PUFFS EVERY 12 HOURS ; GARGLE & SPIT AFTER USE  13 g  5  . loratadine-pseudoephedrine (CLARITIN-D 12 HOUR) 5-120 MG per tablet Take 1 tablet by mouth at bedtime.  30 tablet  6  . medroxyPROGESTERone (DEPO-PROVERA) 150 MG/ML injection Inject 150 mg into the muscle every 3 (three) months.        Marland Kitchen PARoxetine (PAXIL-CR) 37.5 MG 24 hr tablet TAKE 1 TABLET (37.5 MG TOTAL) BY MOUTH DAILY.  30 tablet  5  . tiotropium (SPIRIVA HANDIHALER) 18 MCG inhalation capsule Place 1 capsule (18 mcg total) into inhaler and inhale daily.  30 capsule  5  . PROVENTIL HFA 108 (90 BASE) MCG/ACT inhaler INHALE 1 TO 2 PUFFS EVERY 4 TO 6 HOURS AS NEEDED  6.7 g  5  .  PARoxetine (PAXIL-CR) 37.5 MG 24 hr tablet Take 1 tablet (37.5 mg total) by mouth daily.  30 tablet  0      Review of Systems  Constitutional:   No  weight loss, night sweats,  Fevers, chills, fatigue, lassitude. HEENT:   No headaches,  Difficulty swallowing,  Tooth/dental problems,  Sore throat,                No sneezing, itching, ear ache, nasal congestion, post nasal drip,   CV:  No chest pain,  Orthopnea, PND, swelling in lower extremities, anasarca, dizziness, palpitations  GI  No heartburn, indigestion, abdominal pain, nausea, vomiting, diarrhea, change in bowel habits, loss of appetite  Resp: Notes  shortness of breath with exertion not at rest.  No excess mucus, notes  productive cough,  No non-productive cough,  No coughing up of blood.  No change in color of mucus.  No wheezing.  No chest wall deformity  Skin: no rash or lesions.  GU: no dysuria, change in color of urine, no urgency or  frequency.  No flank pain.  MS:  No joint pain or swelling.  No decreased range of motion.  No back pain.  Psych:  No change in mood or affect. No depression or anxiety.  No memory loss.     Objective:   Physical Exam   Filed Vitals:   03/19/11 1017  BP: 118/70  Pulse: 110  Temp: 98.2 F (36.8 C)  TempSrc: Oral  Height: 5\' 4"  (1.626 m)  Weight: 137 lb 3.2 oz (62.234 kg)  SpO2: 94%    Gen: Pleasant, well-nourished, in no distress,  normal affect  ENT: No lesions,  mouth clear,  oropharynx clear, no postnasal drip  Neck: No JVD, no TMG, no carotid bruits  Lungs: No use of accessory muscles, no dullness to percussion, distant BS  Cardiovascular: RRR, heart sounds normal, no murmur or gallops, no peripheral edema  Abdomen: soft and NT, no HSM,  BS normal  Musculoskeletal: No deformities, no cyanosis or clubbing  Neuro: alert, non focal  Skin: Warm, no lesions or rashes        Assessment & Plan:   COPD Moderate copd with ongoing smoking  Plan Smoking cessation with nicotrol Continue oxygen therapy No change in inhaled or maintenance medications. Return in  6 months Letters for mailbox movement/ work release  Personal history of multiple pulmonary nodules Last CT 5/12: no new nodules, resolution of old nodules

## 2011-03-19 NOTE — Telephone Encounter (Signed)
Patient called back and stated Dr.Hopper rx'ed her Paxil.   Per Dr.Hopper change to Paxil CR 25 mg bid #60/2 refills

## 2011-03-30 ENCOUNTER — Other Ambulatory Visit: Payer: Self-pay | Admitting: Internal Medicine

## 2011-03-31 ENCOUNTER — Other Ambulatory Visit: Payer: Self-pay | Admitting: Internal Medicine

## 2011-03-31 MED ORDER — ALPRAZOLAM 0.25 MG PO TABS: 0.2500 mg | ORAL_TABLET | Freq: Every day | ORAL | Status: DC | PRN

## 2011-03-31 NOTE — Telephone Encounter (Signed)
RX sent, last ov 09/26/2010, last filled 02/20/11

## 2011-03-31 NOTE — Telephone Encounter (Signed)
Dr.Hopper please advise if you are ok with continuing to rx Sprivia. Patient is followed by a Pulmonary Dr.

## 2011-04-01 NOTE — Telephone Encounter (Signed)
Left message on voicemail for patient to return call when available, reason for call: discuss Paxil and send in rx for CR 25 mg bid

## 2011-04-01 NOTE — Telephone Encounter (Signed)
RX sent

## 2011-04-01 NOTE — Telephone Encounter (Signed)
OK to refill

## 2011-04-14 MED ORDER — PAROXETINE HCL ER 25 MG PO TB24: 25.0000 mg | ORAL_TABLET | Freq: Two times a day (BID) | ORAL | Status: DC

## 2011-04-14 NOTE — Telephone Encounter (Signed)
Spoke with patient, patient ok'd med change, aware rx sent to pharmacy

## 2011-05-07 ENCOUNTER — Other Ambulatory Visit: Payer: Self-pay | Admitting: Internal Medicine

## 2011-07-02 ENCOUNTER — Ambulatory Visit (INDEPENDENT_AMBULATORY_CARE_PROVIDER_SITE_OTHER): Payer: Managed Care, Other (non HMO) | Admitting: Family

## 2011-07-02 ENCOUNTER — Encounter: Payer: Self-pay | Admitting: Family

## 2011-07-02 VITALS — BP 138/80 | HR 100 | Temp 98.5°F | Resp 18 | Wt 119.0 lb

## 2011-07-02 DIAGNOSIS — R197 Diarrhea, unspecified: Secondary | ICD-10-CM

## 2011-07-02 DIAGNOSIS — F17200 Nicotine dependence, unspecified, uncomplicated: Secondary | ICD-10-CM

## 2011-07-02 DIAGNOSIS — F329 Major depressive disorder, single episode, unspecified: Secondary | ICD-10-CM

## 2011-07-02 LAB — CBC WITH DIFFERENTIAL/PLATELET
Basophils Absolute: 0.1 10*3/uL (ref 0.0–0.1)
Basophils Relative: 1 % (ref 0–1)
Eosinophils Absolute: 0.1 10*3/uL (ref 0.0–0.7)
HCT: 48.2 % — ABNORMAL HIGH (ref 36.0–46.0)
MCH: 32.9 pg (ref 26.0–34.0)
MCHC: 32.8 g/dL (ref 30.0–36.0)
Monocytes Absolute: 0.4 10*3/uL (ref 0.1–1.0)
Monocytes Relative: 5 % (ref 3–12)
Neutro Abs: 5.6 10*3/uL (ref 1.7–7.7)
RDW: 13.8 % (ref 11.5–15.5)

## 2011-07-02 LAB — BASIC METABOLIC PANEL
CO2: 30 mEq/L (ref 19–32)
Calcium: 9.5 mg/dL (ref 8.4–10.5)
Chloride: 98 mEq/L (ref 96–112)
Creat: 0.62 mg/dL (ref 0.50–1.10)
Glucose, Bld: 104 mg/dL — ABNORMAL HIGH (ref 70–99)
Sodium: 136 mEq/L (ref 135–145)

## 2011-07-02 MED ORDER — BUPROPION HCL ER (SR) 150 MG PO TB12: 150.0000 mg | ORAL_TABLET | Freq: Two times a day (BID) | ORAL | Status: DC

## 2011-07-02 NOTE — Progress Notes (Signed)
Subjective:    Patient ID: Sherry Bautista, female    DOB: 1955-05-11, 56 y.o.   MRN: [id number]  HPI  Ms.  Bautista is a 56 yr old female who presents today to discuss 2 issues:  1) Depression- Pt reports that since the winter, she feels that her depression has worsened. She attributes this in part to work stress and also wonders if menopause may be a contributing factor. She reports wanting to be alone, not wanting to see or speak to anyone. She is having trouble getting herself to go to work.  Missing some work. Sleeping too much, appetite is poor.  She has lost 20 pounds since January. She tells me that she has family support.  Denies SI/HI.  She reports hx of panic attacks but this has improved since Dr. Alwyn Ren increased her paxil a few months back.  2) Diarrhea- started last Friday.  (6 days ago) she reports that she has had 4 bm's today.  Yesterday she had about 10 loose bowel movements.  Denies associated abdominal pain, fever, abdominal cramping, hematochezia or melana.  She was treated with augmentin (2 months ago).  3) Tobacco abuse- she tells me she continues to smoke despite need for continuous oxygen. She tells me that she is motivated to quit smoking.   Review of Systems See HPI  Past Medical History  Diagnosis Date  . Tobacco use disorder   . Depression   . Anxiety   . COPD (chronic obstructive pulmonary disease)   . Allergic rhinitis     allergy shots  w/o significant response  . Hyperglycemia     History   Social History  . Marital Status: Legally Separated    Spouse Name: N/A    Number of Children: N/A  . Years of Education: N/A   Occupational History  . [employer]    Social History Main Topics  . Smoking status: Current Everyday Smoker -- 1.0 packs/day    Types: Cigarettes  . Smokeless tobacco: Never Used  . Alcohol Use: Yes     very rarely  . Drug Use: Not on file  . Sexually Active: Not on file   Other Topics Concern  . Not on file   Social  History Narrative  . No narrative on file    Past Surgical History  Procedure Date  . Breast lumpectomy   . Nasal sinus surgery   . Tonsillectomy   . Knee surgery 2003    cartlage repair    Family History  Problem Relation Age of Onset  . Lung cancer Father   . COPD Father   . Hyperlipidemia Father   . Factor IX deficiency Mother     PTE  . Asthma Sister     Allergies  Allergen Reactions  . Azithromycin     rash  . Tetanus-Diphtheria Toxoids Td     Redness, swelling, stiffness of arm   . Codeine Sulfate     itching    Current Outpatient Prescriptions on File Prior to Visit  Medication Sig Dispense Refill  . albuterol (PROVENTIL HFA;VENTOLIN HFA) 108 (90 BASE) MCG/ACT inhaler Inhale 2 puffs into the lungs every 4 (four) hours as needed for wheezing.  1 Inhaler  4  . ALPRAZolam (XANAX) 0.25 MG tablet Take 1 tablet (0.25 mg total) by mouth daily as needed.  30 tablet  0  . AMBULATORY NON FORMULARY MEDICATION Oxygen: 3 liters at rest and 4 liters with activity       .  DULERA 200-5 MCG/ACT AERO USE 2 PUFFS EVERY 12 HOURS ; GARGLE & SPIT AFTER USE  13 g  5  . loratadine-pseudoephedrine (CLARITIN-D 12 HOUR) 5-120 MG per tablet Take 1 tablet by mouth at bedtime.  30 tablet  6  . medroxyPROGESTERone (DEPO-PROVERA) 150 MG/ML injection Inject 150 mg into the muscle every 3 (three) months.        Marland Kitchen PARoxetine (PAXIL-CR) 25 MG 24 hr tablet Take 1 tablet (25 mg total) by mouth 2 (two) times daily.  60 tablet  2  . SPIRIVA HANDIHALER 18 MCG inhalation capsule PLACE 1 CAPSULE INTO INHALER AND INHALE DAILY  30 capsule  5  . PARoxetine (PAXIL-CR) 37.5 MG 24 hr tablet TAKE 1 TABLET (37.5 MG TOTAL) BY MOUTH DAILY.  30 tablet  5    BP 138/80  Pulse 100  Temp(Src) 98.5 F (36.9 C) (Oral)  Resp 18  Wt 119 lb 0.6 oz (53.996 kg)  SpO2 95%       Objective:   Physical Exam  Constitutional: She appears well-developed and well-nourished. No distress.  Cardiovascular: Normal rate and  regular rhythm.   No murmur heard. Pulmonary/Chest: Effort normal and breath sounds normal. No respiratory distress. She has no wheezes. She has no rales. She exhibits no tenderness.  Abdominal: Soft. Bowel sounds are normal. She exhibits no distension and no mass. There is no tenderness. There is no rebound and no guarding.  Psychiatric:       Mildly flat affect, but pleasant and appropriate.           Assessment & Plan:

## 2011-07-02 NOTE — Assessment & Plan Note (Signed)
I am hopeful that Wellbutrin will help with her success in quitting smoking.  Pt was counseled on smoking cessation for 3-5 minutes.

## 2011-07-02 NOTE — Patient Instructions (Signed)
Please follow up with Dr. Alwyn Ren in 1 month.  Complete your stool studies and return to our lab. Complete your blood work prior to leaving.  Call if your diarrhea worsens, or if it does not continue to improve.  Good luck quitting smoking!

## 2011-07-02 NOTE — Assessment & Plan Note (Signed)
May be viral. No abdominal tenderness on exam.   C. Diff colitis is a possibility as well due to antibiotic use.  Recommended to patient that she complete stool studies and return. Will also obtain bmet to evaluate for dehydration and cbc to evaluate white count.

## 2011-07-02 NOTE — Assessment & Plan Note (Signed)
Deteriorated.  Will give pt trial of Wellbutrin.  She is instructed to go to ED if she develops suicidal ideation and she verbalizes understanding.

## 2011-07-04 ENCOUNTER — Encounter: Payer: Self-pay | Admitting: Family

## 2011-07-04 ENCOUNTER — Telehealth: Payer: Self-pay | Admitting: Family

## 2011-07-04 NOTE — Telephone Encounter (Signed)
Opened in error

## 2011-07-08 ENCOUNTER — Telehealth: Payer: Self-pay | Admitting: *Deleted

## 2011-07-08 DIAGNOSIS — D582 Other hemoglobinopathies: Secondary | ICD-10-CM

## 2011-07-08 LAB — STOOL CULTURE

## 2011-07-08 NOTE — Telephone Encounter (Signed)
Message copied by Kathi Simpers on Tue Jul 08, 2011 11:42 AM ------      Message from: O'SULLIVAN, MELISSA      Created: Fri Jul 04, 2011  4:16 PM       Could you pls call patient and let her know that her hemoglobin level is elevated.  Sometimes this is seen with elevated iron levels.  I would like for her to return for the following blood tests please: iron, ferritin, TIBC (diagnosis elevated hemoglobin).

## 2011-07-08 NOTE — Telephone Encounter (Signed)
Pt notified and will return to the lab tomorrow morning. Future lab order entered and given to the lab. Pt requests copy of lab result. Advised her I will leave it at the front desk to pick up tomorrow.

## 2011-07-09 ENCOUNTER — Telehealth: Payer: Self-pay | Admitting: *Deleted

## 2011-07-09 ENCOUNTER — Encounter: Payer: Self-pay | Admitting: *Deleted

## 2011-07-09 ENCOUNTER — Other Ambulatory Visit: Payer: Self-pay | Admitting: Critical Care Medicine

## 2011-07-09 DIAGNOSIS — J449 Chronic obstructive pulmonary disease, unspecified: Secondary | ICD-10-CM

## 2011-07-09 LAB — IRON AND TIBC: Iron: 115 ug/dL (ref 42–145)

## 2011-07-09 MED ORDER — LORATADINE-PSEUDOEPHEDRINE ER 5-120 MG PO TB12: 1.0000 | ORAL_TABLET | Freq: Every day | ORAL | Status: DC

## 2011-07-09 NOTE — Telephone Encounter (Signed)
Patient called stating she was seen by Efraim Kaufmann, and was asked to return for blood work today. She stated she was out of work yesterday and today due to diarrhea. She would like to know if she could get a work note excusing her for those 2 days.  Per [name] okay; patient informed work note would be left at front desk for patient pick up. Letter printed and left at front desk for patient pick up.

## 2011-07-09 NOTE — Telephone Encounter (Signed)
Pt presented to the lab, order released. 

## 2011-07-09 NOTE — Telephone Encounter (Signed)
Opened in error

## 2011-07-09 NOTE — Telephone Encounter (Signed)
Addended by: Mervin Kung A on: 07/09/2011 11:00 AM   Modules accepted: Orders

## 2011-07-10 LAB — IRON: Iron: 123 ug/dL (ref 42–145)

## 2011-07-11 ENCOUNTER — Encounter: Payer: Self-pay | Admitting: Family

## 2011-07-15 ENCOUNTER — Telehealth: Payer: Self-pay | Admitting: *Deleted

## 2011-07-15 NOTE — Telephone Encounter (Signed)
Received message from pt requesting work note for 5/26 and 5/27 as she missed work again due to diarrhea.  Please advise.

## 2011-07-15 NOTE — Telephone Encounter (Signed)
Letter placed at front desk for pick up and pt notified. Pt voices understanding.

## 2011-07-15 NOTE — Telephone Encounter (Signed)
OK to provide note.  If persistent diarrhea- needs follow up back in office please.

## 2011-07-16 ENCOUNTER — Encounter: Payer: Self-pay | Admitting: *Deleted

## 2011-07-19 DIAGNOSIS — N750 Cyst of Bartholin's gland: Secondary | ICD-10-CM

## 2011-07-19 HISTORY — DX: Cyst of Bartholin's gland: N75.0

## 2011-07-30 ENCOUNTER — Encounter: Payer: Self-pay | Admitting: Family

## 2011-07-30 ENCOUNTER — Ambulatory Visit (INDEPENDENT_AMBULATORY_CARE_PROVIDER_SITE_OTHER): Payer: Managed Care, Other (non HMO) | Admitting: Family

## 2011-07-30 VITALS — BP 120/80 | HR 96 | Temp 98.2°F | Resp 18 | Wt 118.0 lb

## 2011-07-30 DIAGNOSIS — A084 Viral intestinal infection, unspecified: Secondary | ICD-10-CM

## 2011-07-30 DIAGNOSIS — A088 Other specified intestinal infections: Secondary | ICD-10-CM

## 2011-07-30 NOTE — Patient Instructions (Addendum)
Viral Gastroenteritis Viral gastroenteritis is also known as stomach flu. This condition affects the stomach and intestinal tract. It can cause sudden diarrhea and vomiting. The illness typically lasts 3 to 8 days. Most people develop an immune response that eventually gets rid of the virus. While this natural response develops, the virus can make you quite ill. CAUSES  Many different viruses can cause gastroenteritis, such as rotavirus or noroviruses. You can catch one of these viruses by consuming contaminated food or water. You may also catch a virus by sharing utensils or other personal items with an infected person or by touching a contaminated surface. SYMPTOMS  The most common symptoms are diarrhea and vomiting. These problems can cause a severe loss of body fluids (dehydration) and a body salt (electrolyte) imbalance. Other symptoms may include:  Fever.   Headache.   Fatigue.   Abdominal pain.  DIAGNOSIS  Your caregiver can usually diagnose viral gastroenteritis based on your symptoms and a physical exam. A stool sample may also be taken to test for the presence of viruses or other infections. TREATMENT  This illness typically goes away on its own. Treatments are aimed at rehydration. The most serious cases of viral gastroenteritis involve vomiting so severely that you are not able to keep fluids down. In these cases, fluids must be given through an intravenous line (IV). HOME CARE INSTRUCTIONS   Drink enough fluids to keep your urine clear or pale yellow. Drink small amounts of fluids frequently and increase the amounts as tolerated.   Ask your caregiver for specific rehydration instructions.   Avoid:   Foods high in sugar.   Alcohol.   Carbonated drinks.   Tobacco.   Juice.   Caffeine drinks.   Extremely hot or cold fluids.   Fatty, greasy foods.   Too much intake of anything at one time.   Dairy products until 24 to 48 hours after diarrhea stops.   You may  consume probiotics. Probiotics are active cultures of beneficial bacteria. They may lessen the amount and number of diarrheal stools in adults. Probiotics can be found in yogurt with active cultures and in supplements.   Wash your hands well to avoid spreading the virus.   Only take over-the-counter or prescription medicines for pain, discomfort, or fever as directed by your caregiver. Do not give aspirin to children. Antidiarrheal medicines are not recommended.   Ask your caregiver if you should continue to take your regular prescribed and over-the-counter medicines.   Keep all follow-up appointments as directed by your caregiver.  SEEK IMMEDIATE MEDICAL CARE IF:   You are unable to keep fluids down.   You do not urinate at least once every 6 to 8 hours.   You develop shortness of breath.   You notice blood in your stool or vomit. This may look like coffee grounds.   You have abdominal pain that increases or is concentrated in one small area (localized).   You have persistent vomiting or diarrhea.   You have a fever.   The patient is a child younger than 3 months, and he or she has a fever.   The patient is a child older than 3 months, and he or she has a fever and persistent symptoms.   The patient is a child older than 3 months, and he or she has a fever and symptoms suddenly get worse.   The patient is a baby, and he or she has no tears when crying.  MAKE SURE YOU:     Understand these instructions.   Will watch your condition.   Will get help right away if you are not doing well or get worse.  Document Released: 02/03/2005 Document Revised: 01/23/2011 Document Reviewed: 11/20/2010 [employer] Patient Information 2012 [employer]. 

## 2011-07-30 NOTE — Assessment & Plan Note (Signed)
Resolving.  Declines anti-emetic.  Reminded pt on the importance of hydration.  Call if symptoms worsen, or if symptoms do not continue to improve.

## 2011-07-30 NOTE — Progress Notes (Signed)
Subjective:    Patient ID: Sherry Bautista, female    DOB: 1955/08/27, 56 y.o.   MRN: [id number]  HPI  Ms.  Bautista is a 57 yr old female who presents today with complaint of nausea/vomitting yesterday. She has nausea today but has not vomitted.  Denies abdominal pain or fever.  Tolerated gingerale today.  No sick contacts.  She did not go to work yesterday or today. She is requesting a note for her job.    Review of Systems See HPI  Past Medical History  Diagnosis Date  . Tobacco use disorder   . Depression   . Anxiety   . COPD (chronic obstructive pulmonary disease)   . Allergic rhinitis     allergy shots  w/o significant response  . Hyperglycemia     History   Social History  . Marital Status: Legally Separated    Spouse Name: N/A    Number of Children: N/A  . Years of Education: N/A   Occupational History  . [employer]    Social History Main Topics  . Smoking status: Current Everyday Smoker -- 1.0 packs/day    Types: Cigarettes  . Smokeless tobacco: Never Used  . Alcohol Use: Yes     very rarely  . Drug Use: Not on file  . Sexually Active: Not on file   Other Topics Concern  . Not on file   Social History Narrative  . No narrative on file    Past Surgical History  Procedure Date  . Breast lumpectomy   . Nasal sinus surgery   . Tonsillectomy   . Knee surgery 2003    cartlage repair    Family History  Problem Relation Age of Onset  . Lung cancer Father   . COPD Father   . Hyperlipidemia Father   . Factor IX deficiency Mother     PTE  . Asthma Sister     Allergies  Allergen Reactions  . Azithromycin     rash  . Tetanus-Diphtheria Toxoids Td     Redness, swelling, stiffness of arm   . Codeine Sulfate     itching    Current Outpatient Prescriptions on File Prior to Visit  Medication Sig Dispense Refill  . albuterol (PROVENTIL HFA;VENTOLIN HFA) 108 (90 BASE) MCG/ACT inhaler Inhale 2 puffs into the lungs every 4 (four) hours as needed  for wheezing.  1 Inhaler  4  . ALPRAZolam (XANAX) 0.25 MG tablet Take 1 tablet (0.25 mg total) by mouth daily as needed.  30 tablet  0  . AMBULATORY NON FORMULARY MEDICATION Oxygen: 3 liters at rest and 4 liters with activity       . buPROPion (WELLBUTRIN SR) 150 MG 12 hr tablet Take 1 tablet (150 mg total) by mouth 2 (two) times daily.  60 tablet  1  . DULERA 200-5 MCG/ACT AERO USE 2 PUFFS EVERY 12 HOURS ; GARGLE & SPIT AFTER USE  13 g  5  . loratadine-pseudoephedrine (CLARITIN-D 12 HOUR) 5-120 MG per tablet Take 1 tablet by mouth at bedtime.  30 tablet  0  . medroxyPROGESTERone (DEPO-PROVERA) 150 MG/ML injection Inject 150 mg into the muscle every 3 (three) months.        Marland Kitchen PARoxetine (PAXIL-CR) 25 MG 24 hr tablet Take 1 tablet (25 mg total) by mouth 2 (two) times daily.  60 tablet  2  . SPIRIVA HANDIHALER 18 MCG inhalation capsule PLACE 1 CAPSULE INTO INHALER AND INHALE DAILY  30 capsule  5  .  PARoxetine (PAXIL-CR) 37.5 MG 24 hr tablet TAKE 1 TABLET (37.5 MG TOTAL) BY MOUTH DAILY.  30 tablet  5    BP 120/80  Pulse 96  Temp 98.2 F (36.8 C) (Oral)  Resp 18  Wt 118 lb 0.6 oz (53.543 kg)  SpO2 99%       Objective:   Physical Exam  Constitutional: She appears well-developed and well-nourished. No distress.  Cardiovascular: Normal rate and regular rhythm.   No murmur heard. Pulmonary/Chest: Effort normal and breath sounds normal. No respiratory distress. She has no wheezes. She has no rales. She exhibits no tenderness.       Wearing oxygen.   Abdominal: Soft. Bowel sounds are normal. She exhibits no distension and no mass. There is no tenderness. There is no rebound and no guarding.  Musculoskeletal: She exhibits no edema.  Skin: Skin is warm and dry. No rash noted. No erythema. No pallor.  Psychiatric: She has a normal mood and affect. Her behavior is normal. Judgment and thought content normal.          Assessment & Plan:

## 2011-08-18 ENCOUNTER — Telehealth: Payer: Self-pay | Admitting: Internal Medicine

## 2011-08-18 MED ORDER — PAROXETINE HCL ER 25 MG PO TB24: 25.0000 mg | ORAL_TABLET | Freq: Two times a day (BID) | ORAL | Status: DC

## 2011-08-18 NOTE — Telephone Encounter (Signed)
RX sent

## 2011-08-18 NOTE — Telephone Encounter (Signed)
Refill: Paroxetine cr 25mg  ter. Take 1 tablet by mouth 2 times daily. Qty 60.

## 2011-09-02 ENCOUNTER — Encounter: Payer: Self-pay | Admitting: Adult Health

## 2011-09-02 ENCOUNTER — Encounter: Payer: Self-pay | Admitting: *Deleted

## 2011-09-02 ENCOUNTER — Ambulatory Visit (INDEPENDENT_AMBULATORY_CARE_PROVIDER_SITE_OTHER): Payer: Managed Care, Other (non HMO) | Admitting: Adult Health

## 2011-09-02 VITALS — BP 110/70 | HR 110 | Temp 98.1°F | Ht 64.0 in | Wt 113.2 lb

## 2011-09-02 DIAGNOSIS — J449 Chronic obstructive pulmonary disease, unspecified: Secondary | ICD-10-CM

## 2011-09-02 MED ORDER — DOXYCYCLINE HYCLATE 100 MG PO TABS: 100.0000 mg | ORAL_TABLET | Freq: Two times a day (BID) | ORAL | Status: AC

## 2011-09-02 MED ORDER — PREDNISONE 10 MG PO TABS: ORAL_TABLET | ORAL | Status: DC

## 2011-09-02 NOTE — Assessment & Plan Note (Signed)
Exacerbation  Smoking cesstation encouraged   Plan;  Doxycycline 100mg  Twice daily  For 7 days  Mucinex DM Twice daily  As needed  Cough/congestion  Prednisone taper over next week.  Fluids and rest  Please contact office for sooner follow up if symptoms do not improve or worsen or seek emergency care  follow up Dr. Delford Field  As planned and .As needed

## 2011-09-02 NOTE — Progress Notes (Signed)
  Subjective:    Patient ID: Sherry Bautista, female    DOB: 08-31-1955, 56 y.o.   MRN: [id number] HPI  56 y.o.WF  With known hx Dx of copd, active smoker -O2 dependent   03/19/2011 Had a URI and saw PCP 2 weeks ago. Rx pred and Avelox.  This helped.  Had mucus and dyspnea and rattling.  Now no chest pain.  No wheezing.  Dyspnea with exertion off oxygen Uses pulse system.  Liquid system will freeze up. >>no changes   09/02/2011 Acute OV  Complains of  Increased sob,wheezing, productive cough.x 1 week. Seen at Urgent care 7/13 , rx depo medrol shot and neb  cxr reported as no PNA per pt.  Does not feel she can work the way she is  Now  Feels she is going to have to apply for disability  No hemoptysis or fever    Review of Systems  Constitutional:   No  weight loss, night sweats,  Fevers, chills, + fatigue, lassitude. HEENT:   No headaches,  Difficulty swallowing,  Tooth/dental problems,  Sore throat,                No sneezing, itching, ear ache,  +nasal congestion, post nasal drip,   CV:  No chest pain,  Orthopnea, PND, swelling in lower extremities, anasarca, dizziness, palpitations  GI  No heartburn, indigestion, abdominal pain, nausea, vomiting, diarrhea, change in bowel habits, loss of appetite  Resp:    No coughing up of blood.    No chest wall deformity  Skin: no rash or lesions.  GU: no dysuria, change in color of urine, no urgency or frequency.  No flank pain.  MS:  No joint pain or swelling.  No decreased range of motion.  No back pain.  Psych:  No change in mood or affect. No depression or anxiety.  No memory loss.     Objective:   Physical Exam    Gen: Pleasant, well-nourished, in no distress,  normal affect  ENT: No lesions,  mouth clear,  oropharynx clear, no postnasal drip  Neck: No JVD, no TMG, no carotid bruits  Lungs: No use of accessory muscles, no dullness to percussion, coarse BS   Cardiovascular: RRR, heart sounds normal, no murmur or gallops,  no peripheral edema  Abdomen: soft and NT, no HSM,  BS normal  Musculoskeletal: No deformities, no cyanosis or clubbing  Neuro: alert, non focal  Skin: Warm, no lesions or rashes        Assessment & Plan:

## 2011-09-02 NOTE — Progress Notes (Deleted)
Subjective:    Patient ID: Sherry Bautista, female    DOB: 1955-11-01, 56 y.o.   MRN: [id number]  HPI    Review of Systems     Objective:   Physical Exam        Assessment & Plan:   Subjective:    Patient ID: Sherry Bautista, female    DOB: 26-Jun-1955, 56 y.o.   MRN: [id number]  HPI  56 y.o.WF   Dx of copd 8-10 years   03/19/2011 Had a URI and saw PCP 2 weeks ago. Rx pred and Avelox.  This helped.  Had mucus and dyspnea and rattling.  Now no chest pain.  No wheezing.  Dyspnea with exertion off oxygen Uses pulse system.  Liquid system will freeze up. Pt denies any significant sore throat, nasal congestion or excess secretions, fever, chills, sweats, unintended weight loss, pleurtic or exertional chest pain, orthopnea PND, or leg swelling Pt denies any increase in rescue therapy over baseline, denies waking up needing it or having any early am or nocturnal exacerbations of coughing/wheezing/or dyspnea. Pt also denies any obvious fluctuation in symptoms with  weather or environmental change or other alleviating or aggravating factors  Past Medical History  Diagnosis Date  . Current smoker   . Depression   . Anxiety   . COPD (chronic obstructive pulmonary disease)   . Allergic rhinitis     allergy shots  w/o significant response  . Hyperglycemia      Family History  Problem Relation Age of Onset  . Lung cancer Father   . COPD Father   . Hyperlipidemia Father   . Factor IX deficiency Mother     PTE  . Asthma Sister      History   Social History  . Marital Status: Legally Separated    Spouse Name: N/A    Number of Children: N/A  . Years of Education: N/A   Occupational History  . [employer]    Social History Main Topics  . Smoking status: Current Everyday Smoker -- 1.0 packs/day    Types: Cigarettes  . Smokeless tobacco: Never Used  . Alcohol Use: Yes     very rarely  . Drug Use: Not on file  . Sexually Active: Not on file   Other Topics Concern    . Not on file   Social History Narrative  . No narrative on file     Allergies  Allergen Reactions  . Azithromycin     rash  . Tetanus-Diphtheria Toxoids     Redness, swelling, stiffness of arm   . Codeine Sulfate     itching     Outpatient Prescriptions Prior to Visit  Medication Sig Dispense Refill  . ALPRAZolam (XANAX) 0.25 MG tablet Take 1 tablet (0.25 mg total) by mouth daily as needed.  30 tablet  0  . AMBULATORY NON FORMULARY MEDICATION Oxygen: 3 liters at rest and 4 liters with activity       . DULERA 200-5 MCG/ACT AERO USE 2 PUFFS EVERY 12 HOURS ; GARGLE & SPIT AFTER USE  13 g  5  . loratadine-pseudoephedrine (CLARITIN-D 12 HOUR) 5-120 MG per tablet Take 1 tablet by mouth at bedtime.  30 tablet  6  . medroxyPROGESTERone (DEPO-PROVERA) 150 MG/ML injection Inject 150 mg into the muscle every 3 (three) months.        Marland Kitchen PARoxetine (PAXIL-CR) 37.5 MG 24 hr tablet TAKE 1 TABLET (37.5 MG TOTAL) BY MOUTH DAILY.  30 tablet  5  .  tiotropium (SPIRIVA HANDIHALER) 18 MCG inhalation capsule Place 1 capsule (18 mcg total) into inhaler and inhale daily.  30 capsule  5  . PROVENTIL HFA 108 (90 BASE) MCG/ACT inhaler INHALE 1 TO 2 PUFFS EVERY 4 TO 6 HOURS AS NEEDED  6.7 g  5  . PARoxetine (PAXIL-CR) 37.5 MG 24 hr tablet Take 1 tablet (37.5 mg total) by mouth daily.  30 tablet  0      Review of Systems  Constitutional:   No  weight loss, night sweats,  Fevers, chills, fatigue, lassitude. HEENT:   No headaches,  Difficulty swallowing,  Tooth/dental problems,  Sore throat,                No sneezing, itching, ear ache, nasal congestion, post nasal drip,   CV:  No chest pain,  Orthopnea, PND, swelling in lower extremities, anasarca, dizziness, palpitations  GI  No heartburn, indigestion, abdominal pain, nausea, vomiting, diarrhea, change in bowel habits, loss of appetite  Resp: Notes  shortness of breath with exertion not at rest.  No excess mucus, notes  productive cough,  No  non-productive cough,  No coughing up of blood.  No change in color of mucus.  No wheezing.  No chest wall deformity  Skin: no rash or lesions.  GU: no dysuria, change in color of urine, no urgency or frequency.  No flank pain.  MS:  No joint pain or swelling.  No decreased range of motion.  No back pain.  Psych:  No change in mood or affect. No depression or anxiety.  No memory loss.     Objective:   Physical Exam   Filed Vitals:   03/19/11 1017  BP: 118/70  Pulse: 110  Temp: 98.2 F (36.8 C)  TempSrc: Oral  Height: 5\' 4"  (1.626 m)  Weight: 137 lb 3.2 oz (62.234 kg)  SpO2: 94%    Gen: Pleasant, well-nourished, in no distress,  normal affect  ENT: No lesions,  mouth clear,  oropharynx clear, no postnasal drip  Neck: No JVD, no TMG, no carotid bruits  Lungs: No use of accessory muscles, no dullness to percussion, distant BS  Cardiovascular: RRR, heart sounds normal, no murmur or gallops, no peripheral edema  Abdomen: soft and NT, no HSM,  BS normal  Musculoskeletal: No deformities, no cyanosis or clubbing  Neuro: alert, non focal  Skin: Warm, no lesions or rashes        Assessment & Plan:   COPD Moderate copd with ongoing smoking  Plan Smoking cessation with nicotrol Continue oxygen therapy No change in inhaled or maintenance medications. Return in  6 months Letters for mailbox movement/ work release  Personal history of multiple pulmonary nodules Last CT 5/12: no new nodules, resolution of old nodules

## 2011-09-02 NOTE — Patient Instructions (Signed)
Doxycycline 100mg  Twice daily  For 7 days  Mucinex DM Twice daily  As needed  Cough/congestion  Prednisone taper over next week.  Fluids and rest  Please contact office for sooner follow up if symptoms do not improve or worsen or seek emergency care  follow up Dr. Delford Field  As planned and .As needed

## 2011-09-09 ENCOUNTER — Other Ambulatory Visit: Payer: Self-pay | Admitting: Internal Medicine

## 2011-09-09 MED ORDER — ALPRAZOLAM 0.25 MG PO TABS: 0.2500 mg | ORAL_TABLET | Freq: Every day | ORAL | Status: DC | PRN

## 2011-09-09 NOTE — Telephone Encounter (Signed)
ALPRAZOLAM 0.25MG  TABLE QTY:30 LAST REFILL: 30 TAKE 1 TABLET(s) BY MOUTH DAILY AS NEEDED

## 2011-09-09 NOTE — Telephone Encounter (Signed)
RX called in, side note: patient due to schedule a CPX

## 2011-09-10 ENCOUNTER — Telehealth: Payer: Self-pay | Admitting: Adult Health

## 2011-09-10 NOTE — Telephone Encounter (Signed)
This is ok with me  

## 2011-09-10 NOTE — Telephone Encounter (Signed)
I spoke with pt and she is wanting her work excuse to be extending until 09/19/11 instead of 09/15/11. She states she is still having problems with her breathing and she is scheduled to see PW on 09/18/11 for follow up and that is reason she is wanting to be out. She requests I send this to PW since TP is out and she is scheduled to return back to work on Monday. Please advise Dr. Delford Field thanks

## 2011-09-11 ENCOUNTER — Encounter: Payer: Self-pay | Admitting: *Deleted

## 2011-09-11 NOTE — Telephone Encounter (Signed)
Called the patient back to verify fax number we need to send the letter to and it is [phone]. Fax keeps failing with an error msg of busy/no signal. We will keep trying. If fax continues to not go through, we will see if there is an alternate fax number.

## 2011-09-11 NOTE — Telephone Encounter (Signed)
Patient is aware and wants the new letter faxed to [phone], ATTN:  [name], Store Manager. Letter has been retyped and faxed.

## 2011-09-15 ENCOUNTER — Telehealth: Payer: Self-pay | Admitting: Critical Care Medicine

## 2011-09-15 NOTE — Telephone Encounter (Signed)
I have re-faxed letter to # given pt aware and nothing further was needed

## 2011-09-18 ENCOUNTER — Encounter: Payer: Self-pay | Admitting: *Deleted

## 2011-09-18 ENCOUNTER — Ambulatory Visit (INDEPENDENT_AMBULATORY_CARE_PROVIDER_SITE_OTHER): Payer: Managed Care, Other (non HMO) | Admitting: Critical Care Medicine

## 2011-09-18 ENCOUNTER — Encounter: Payer: Self-pay | Admitting: Critical Care Medicine

## 2011-09-18 VITALS — BP 114/70 | HR 99 | Temp 98.4°F | Ht 64.0 in | Wt 116.0 lb

## 2011-09-18 DIAGNOSIS — J449 Chronic obstructive pulmonary disease, unspecified: Secondary | ICD-10-CM

## 2011-09-18 NOTE — Assessment & Plan Note (Addendum)
CAT 27 09/18/11 Asthmatic bronchitis with gold stage C. COPD oxygen dependent with continued smoking use Plan Maintain inhaled medications as prescribed 10 minutes of smoking  cessation counseling was spent with the patient, I emphasized that this patient needs to quit smoking if she is to improve her pulmonary status. She was encouraged to use the Nicotrol inhaler system Return form a

## 2011-09-18 NOTE — Patient Instructions (Addendum)
Focus on smoking cessation No change in inhaled medications Return 4 months

## 2011-09-18 NOTE — Progress Notes (Signed)
Subjective:    Patient ID: Sherry Bautista, female    DOB: [birth year], 56 y.o.   MRN: [id number]  HPI 56 y.o.WF  With known hx Dx of copd, active smoker -O2 dependent   03/19/2011 Had a URI and saw PCP 2 weeks ago. Rx pred and Avelox.  This helped.  Had mucus and dyspnea and rattling.  Now no chest pain.  No wheezing.  Dyspnea with exertion off oxygen Uses pulse system.  Liquid system will freeze up. >>no changes   09/02/2011 Acute OV  Complains of  Increased sob,wheezing, productive cough.x 1 week. Seen at Urgent care 7/13 , rx depo medrol shot and neb  cxr reported as no PNA per pt.  Does not feel she can work the way she is  Now  Feels she is going to have to apply for disability  No hemoptysis or fever   09/18/2011 Not seen since 1/13.  At last OV we rec: prednisone/doxycyline. Pt sl better but still congested.  Pt remains weak.  Now smoking 1PPD.   Still has some nicotrol.    Notes some mucus clear to cream.  No real chest pain unless very dyspneic. No edema in feet, Left ankle with edema     Review of Systems 11pt ros neg except per hpi    Objective:   Physical Exam Filed Vitals:   09/18/11 1324  BP: 114/70  Pulse: 99  Temp: 98.4 F (36.9 C)  TempSrc: Oral  Height: 5\' 4"  (1.626 m)  Weight: 116 lb (52.617 kg)  SpO2: 97%    Gen: Pleasant, well-nourished, in no distress,  normal affect  ENT: No lesions,  mouth clear,  oropharynx clear, no postnasal drip  Neck: No JVD, no TMG, no carotid bruits  Lungs: No use of accessory muscles, no dullness to percussion, distant breath sounds and scattered rhonchi Cardiovascular: RRR, heart sounds normal, no murmur or gallops, no peripheral edema  Abdomen: soft and NT, no HSM,  BS normal  Musculoskeletal: No deformities, no cyanosis or clubbing  Neuro: alert, non focal  Skin: Warm, no lesions or rashes  No results found.        Assessment & Plan:   COPD CAT 27 09/18/11 Asthmatic bronchitis with gold stage C.  COPD oxygen dependent with continued smoking use Plan Maintain inhaled medications as prescribed 10 minutes of smoking  cessation counseling was spent with the patient, I emphasized that this patient needs to quit smoking if she is to improve her pulmonary status. She was encouraged to use the Nicotrol inhaler system Return form a   Updated Medication List Outpatient Encounter Prescriptions as of 09/18/2011  Medication Sig Dispense Refill  . albuterol (PROVENTIL HFA;VENTOLIN HFA) 108 (90 BASE) MCG/ACT inhaler Inhale 2 puffs into the lungs every 4 (four) hours as needed for wheezing.  1 Inhaler  4  . ALPRAZolam (XANAX) 0.25 MG tablet Take 1 tablet (0.25 mg total) by mouth daily as needed.  30 tablet  0  . AMBULATORY NON FORMULARY MEDICATION Oxygen: 3 liters at rest and 4 liters with activity       . buPROPion (WELLBUTRIN SR) 150 MG 12 hr tablet Take 1 tablet (150 mg total) by mouth 2 (two) times daily.  60 tablet  1  . DULERA 200-5 MCG/ACT AERO USE 2 PUFFS EVERY 12 HOURS ; GARGLE & SPIT AFTER USE  13 g  5  . loratadine-pseudoephedrine (CLARITIN-D 12 HOUR) 5-120 MG per tablet Take 1 tablet by mouth at bedtime.  30  tablet  0  . PARoxetine (PAXIL-CR) 25 MG 24 hr tablet Take 1 tablet (25 mg total) by mouth 2 (two) times daily.  60 tablet  1  . SPIRIVA HANDIHALER 18 MCG inhalation capsule PLACE 1 CAPSULE INTO INHALER AND INHALE DAILY  30 capsule  5  . DISCONTD: medroxyPROGESTERone (DEPO-PROVERA) 150 MG/ML injection Inject 150 mg into the muscle every 3 (three) months.        . DISCONTD: PARoxetine (PAXIL-CR) 37.5 MG 24 hr tablet TAKE 1 TABLET (37.5 MG TOTAL) BY MOUTH DAILY.  30 tablet  5  . DISCONTD: predniSONE (DELTASONE) 10 MG tablet 4 tabs for 2 days, then 3 tabs for 2 days, 2 tabs for 2 days, then 1 tab for 2 days, then stop  20 tablet  0

## 2011-09-29 ENCOUNTER — Telehealth: Payer: Self-pay | Admitting: Critical Care Medicine

## 2011-09-29 NOTE — Telephone Encounter (Signed)
Spoke with [employer]-confirmed that pt was put out of work from [employer] back in July and by PW in August.

## 2011-10-01 ENCOUNTER — Telehealth: Payer: Self-pay | Admitting: Critical Care Medicine

## 2011-10-01 NOTE — Telephone Encounter (Signed)
I spoke with Sherry Bautista and she stated pt never sign the release of information and the disability paperwork can't be processed until this is done--she did mail this out to pt home. I spoke with pt and made her aware of what Sherry Bautista had informed me of--she does have this and will get this back to [place] as soon as possible. Nothing further was needed

## 2011-10-14 ENCOUNTER — Telehealth: Payer: Self-pay | Admitting: Critical Care Medicine

## 2011-10-14 NOTE — Telephone Encounter (Signed)
lmomtcb x1 for pt.Sherry Bautista has this and Dr. Delford Field has not been in the office to sign.

## 2011-10-14 NOTE — Telephone Encounter (Signed)
Form completed and signed by Dr. Delford Field.  I have sent this back to [facility].

## 2011-10-14 NOTE — Telephone Encounter (Signed)
Message copied by Gweneth Dimitri D on Tue Oct 14, 2011  3:45 PM ------      Message from: Bary Leriche      Created: Mon Oct 13, 2011  9:48 AM       Hi Janissa Bertram,  I am sending Dr. Delford Field the disability form to review for patient Sherry Bautista.  There are some high lighted areas that he needs to review also.            Thank you,  Elease Hashimoto @ [employer]

## 2011-10-16 NOTE — Telephone Encounter (Signed)
Pt returned call. I advised her that this was completed and sent to [employer]. Nothing further needed at this time per pt. Sherry Bautista

## 2011-10-17 ENCOUNTER — Encounter: Payer: Self-pay | Admitting: Family

## 2011-10-17 ENCOUNTER — Ambulatory Visit (INDEPENDENT_AMBULATORY_CARE_PROVIDER_SITE_OTHER): Payer: Managed Care, Other (non HMO) | Admitting: Family

## 2011-10-17 VITALS — BP 120/76 | HR 90 | Temp 99.1°F | Resp 18 | Wt 114.0 lb

## 2011-10-17 DIAGNOSIS — A088 Other specified intestinal infections: Secondary | ICD-10-CM

## 2011-10-17 DIAGNOSIS — F329 Major depressive disorder, single episode, unspecified: Secondary | ICD-10-CM

## 2011-10-17 DIAGNOSIS — A084 Viral intestinal infection, unspecified: Secondary | ICD-10-CM

## 2011-10-17 MED ORDER — BUPROPION HCL ER (SR) 150 MG PO TB12: 150.0000 mg | ORAL_TABLET | Freq: Two times a day (BID) | ORAL | Status: AC

## 2011-10-17 NOTE — Assessment & Plan Note (Signed)
Symptoms most consistent with viral gastroenteritis.  Encouraged hydration and that pt let us know if symptoms worsen or if no improvement in 2-3 days.

## 2011-10-17 NOTE — Progress Notes (Signed)
Subjective:    Patient ID: Sherry Bautista, female    DOB: [birth year], 56 y.o.   MRN: [id number]  HPI  Ms.  Bautista is a 56 yr old female who presents today with two concerns:  1) Nausea- pt reports + Nausea x 2 days.  Denies associated abdominal pain, fever, vomitting or diarrhea.  2) Depression-  Reports that her mood is not good.  Was doing better on wellbutrin. She continues paxil, but reports that she has been off of wellbutrin x 2 months.  She reports that she was feeling much better when she was taking wellbutrin.    Review of Systems See HPI  Past Medical History  Diagnosis Date  . Tobacco use disorder   . Depression   . Anxiety   . COPD (chronic obstructive pulmonary disease)   . Allergic rhinitis     allergy shots  w/o significant response  . Hyperglycemia     History   Social History  . Marital Status: Legally Separated    Spouse Name: N/A    Number of Children: N/A  . Years of Education: N/A   Occupational History  . [employer]    Social History Main Topics  . Smoking status: Current Everyday Smoker    Types: Cigarettes  . Smokeless tobacco: Never Used   Comment: started smoking at age 52.  Currently smoking 1ppd  . Alcohol Use: Yes     very rarely  . Drug Use: Not on file  . Sexually Active: Not on file   Other Topics Concern  . Not on file   Social History Narrative  . No narrative on file    Past Surgical History  Procedure Date  . Breast lumpectomy   . Nasal sinus surgery   . Tonsillectomy   . Knee surgery 2003    cartlage repair    Family History  Problem Relation Age of Onset  . Lung cancer Father   . COPD Father   . Hyperlipidemia Father   . Factor IX deficiency Mother     PTE  . Asthma Sister     Allergies  Allergen Reactions  . Azithromycin     rash  . Tetanus-Diphtheria Toxoids Td     Redness, swelling, stiffness of arm   . Codeine Sulfate     itching    Current Outpatient Prescriptions on File Prior to Visit    Medication Sig Dispense Refill  . albuterol (PROVENTIL HFA;VENTOLIN HFA) 108 (90 BASE) MCG/ACT inhaler Inhale 2 puffs into the lungs every 4 (four) hours as needed for wheezing.  1 Inhaler  4  . ALPRAZolam (XANAX) 0.25 MG tablet Take 1 tablet (0.25 mg total) by mouth daily as needed.  30 tablet  0  . AMBULATORY NON FORMULARY MEDICATION Oxygen: 3 liters at rest and 4 liters with activity       . DULERA 200-5 MCG/ACT AERO USE 2 PUFFS EVERY 12 HOURS ; GARGLE & SPIT AFTER USE  13 g  5  . loratadine-pseudoephedrine (CLARITIN-D 12 HOUR) 5-120 MG per tablet Take 1 tablet by mouth at bedtime.  30 tablet  0  . PARoxetine (PAXIL-CR) 25 MG 24 hr tablet Take 1 tablet (25 mg total) by mouth 2 (two) times daily.  60 tablet  1  . SPIRIVA HANDIHALER 18 MCG inhalation capsule PLACE 1 CAPSULE INTO INHALER AND INHALE DAILY  30 capsule  5  . DISCONTD: buPROPion (WELLBUTRIN SR) 150 MG 12 hr tablet Take 1 tablet (150 mg total)  by mouth 2 (two) times daily.  60 tablet  1    BP 120/76  Pulse 90  Temp 99.1 F (37.3 C) (Oral)  Resp 18  Wt 114 lb (51.71 kg)  SpO2 94%        Objective:   Physical Exam  Constitutional: She is oriented to person, place, and time. She appears well-developed and well-nourished. No distress.  HENT:  Head: Normocephalic and atraumatic.  Right Ear: Tympanic membrane and ear canal normal.  Left Ear: Tympanic membrane and ear canal normal.  Mouth/Throat: No posterior oropharyngeal edema or posterior oropharyngeal erythema.  Cardiovascular: Normal rate and regular rhythm.   No murmur heard. Pulmonary/Chest: Effort normal and breath sounds normal. No respiratory distress. She has no wheezes. She has no rales. She exhibits no tenderness.  Abdominal: Soft. Bowel sounds are normal. She exhibits no distension and no mass. There is no tenderness. There is no rebound and no guarding.  Neurological: She is alert and oriented to person, place, and time.  Skin: Skin is warm and dry.   Psychiatric: She has a normal mood and affect. Her behavior is normal. Judgment and thought content normal.          Assessment & Plan:

## 2011-10-17 NOTE — Patient Instructions (Addendum)
Please follow up in 6 weeks, sooner if problems or concerns.  

## 2011-10-17 NOTE — Assessment & Plan Note (Signed)
Deteriorated.  Resume Wellbutrin, follow up in 6 weeks sooner if problems/concerns.

## 2011-10-21 ENCOUNTER — Telehealth: Payer: Self-pay | Admitting: *Deleted

## 2011-10-21 NOTE — Telephone Encounter (Signed)
OK to provide work note please.

## 2011-10-21 NOTE — Telephone Encounter (Signed)
Notified pt of work note completion and she requested that it be faxed to secure fax [phone], Attn: Carolanne Grumbling. Note faxed.

## 2011-10-21 NOTE — Telephone Encounter (Signed)
Received call from pt stating she was unable to work this weekend due to her nausea and not feeling well. Pt states she has returned to work today and would like to have her work note extended to cover this past Saturday, Sunday and Monday.  Please advise.

## 2011-10-22 ENCOUNTER — Ambulatory Visit (INDEPENDENT_AMBULATORY_CARE_PROVIDER_SITE_OTHER): Payer: Managed Care, Other (non HMO) | Admitting: Family

## 2011-10-22 ENCOUNTER — Encounter: Payer: Self-pay | Admitting: Family

## 2011-10-22 VITALS — BP 118/80 | HR 96 | Temp 99.0°F | Resp 18 | Wt 113.1 lb

## 2011-10-22 DIAGNOSIS — I499 Cardiac arrhythmia, unspecified: Secondary | ICD-10-CM

## 2011-10-22 DIAGNOSIS — S39012A Strain of muscle, fascia and tendon of lower back, initial encounter: Secondary | ICD-10-CM | POA: Insufficient documentation

## 2011-10-22 DIAGNOSIS — S335XXA Sprain of ligaments of lumbar spine, initial encounter: Secondary | ICD-10-CM

## 2011-10-22 NOTE — Patient Instructions (Addendum)
You may use flexeril at bedtime and motrin as needed for the next 1 week.   Call if you develop worsening low back pain or if symptoms are not improved in 1-2 weeks.

## 2011-10-22 NOTE — Progress Notes (Signed)
Subjective:    Patient ID: Sherry Bautista, female    DOB: 1955-04-12, 56 y.o.   MRN: [id number]  HPI  Sherry Bautista is a 56 yr old female who presents today with chief complaint of back pain. She reports that she slipped in her bath tub 2 days ago.  She was able to catch on the the bar in her shower which slowed her fall. Since that time she reports pain in lower back.  She reports pain is 6/10 which she describes as non-radiating. She has used advil and used flexeril yesterday which helped some.  She denies associated LE weakness.  She did not go to work last night or today due to the pain.    Review of Systems See HPI  Past Medical History  Diagnosis Date  . Tobacco use disorder   . Depression   . Anxiety   . COPD (chronic obstructive pulmonary disease)   . Allergic rhinitis     allergy shots  w/o significant response  . Hyperglycemia     History   Social History  . Marital Status: Legally Separated    Spouse Name: N/A    Number of Children: N/A  . Years of Education: N/A   Occupational History  . [employer]    Social History Main Topics  . Smoking status: Current Everyday Smoker    Types: Cigarettes  . Smokeless tobacco: Never Used   Comment: started smoking at age 69.  Currently smoking 1ppd  . Alcohol Use: Yes     very rarely  . Drug Use: Not on file  . Sexually Active: Not on file   Other Topics Concern  . Not on file   Social History Narrative  . No narrative on file    Past Surgical History  Procedure Date  . Breast lumpectomy   . Nasal sinus surgery   . Tonsillectomy   . Knee surgery 2003    cartlage repair    Family History  Problem Relation Age of Onset  . Lung cancer Father   . COPD Father   . Hyperlipidemia Father   . Factor IX deficiency Mother     PTE  . Asthma Sister     Allergies  Allergen Reactions  . Azithromycin     rash  . Tetanus-Diphtheria Toxoids Td     Redness, swelling, stiffness of arm   . Codeine Sulfate    itching    Current Outpatient Prescriptions on File Prior to Visit  Medication Sig Dispense Refill  . albuterol (PROVENTIL HFA;VENTOLIN HFA) 108 (90 BASE) MCG/ACT inhaler Inhale 2 puffs into the lungs every 4 (four) hours as needed for wheezing.  1 Inhaler  4  . ALPRAZolam (XANAX) 0.25 MG tablet Take 1 tablet (0.25 mg total) by mouth daily as needed.  30 tablet  0  . AMBULATORY NON FORMULARY MEDICATION Oxygen: 3 liters at rest and 4 liters with activity       . buPROPion (WELLBUTRIN SR) 150 MG 12 hr tablet Take 1 tablet (150 mg total) by mouth 2 (two) times daily.  60 tablet  2  . DULERA 200-5 MCG/ACT AERO USE 2 PUFFS EVERY 12 HOURS ; GARGLE & SPIT AFTER USE  13 g  5  . loratadine-pseudoephedrine (CLARITIN-D 12 HOUR) 5-120 MG per tablet Take 1 tablet by mouth at bedtime.  30 tablet  0  . PARoxetine (PAXIL-CR) 25 MG 24 hr tablet Take 1 tablet (25 mg total) by mouth 2 (two) times daily.  60 tablet  1  . SPIRIVA HANDIHALER 18 MCG inhalation capsule PLACE 1 CAPSULE INTO INHALER AND INHALE DAILY  30 capsule  5    BP 118/80  Pulse 96  Temp 99 F (37.2 C) (Oral)  Resp 18  Wt 113 lb 1.3 oz (51.293 kg)  SpO2 99%       Objective:   Physical Exam  Constitutional: She appears well-developed and well-nourished. No distress.  Cardiovascular:  No murmur heard.      S1/s2 irregular rate/rythm   Pulmonary/Chest: Effort normal and breath sounds normal. No respiratory distress. She has no wheezes. She has no rales. She exhibits no tenderness.  Musculoskeletal:       No paraspinal tenderness to palpation  Psychiatric: She has a normal mood and affect. Her behavior is normal. Judgment and thought content normal.  Neuro- bilateral LE strength is 5/5        Assessment & Plan:

## 2011-10-22 NOTE — Assessment & Plan Note (Signed)
Continue short course of ibuprofen prn and flexeril HS.  Pt is to let us know if her symptoms worsen or if she is not feeling better in 2-3 days.

## 2011-10-23 ENCOUNTER — Telehealth: Payer: Self-pay | Admitting: *Deleted

## 2011-10-23 NOTE — Telephone Encounter (Signed)
Pt called stating she is still having considerable pain in her back today and doesn't feel she can return to work today.  Pt is requesting that we extend her work note to today and she will try to return to work Friday.  Pt would like noted faxed to her work @ [phone] Attn: Carolanne Grumbling.  Please advise.

## 2011-10-23 NOTE — Telephone Encounter (Signed)
Letter printed and faxed to # below, pt notified.

## 2011-10-23 NOTE — Telephone Encounter (Signed)
Ok to send pls.

## 2011-10-24 NOTE — Telephone Encounter (Signed)
Pt called requesting revised note to include today as she is unable to return to work today as well. Verbal authorization obtained from Provider. Note printed and faxed to number below. Pt notified.

## 2011-11-04 ENCOUNTER — Other Ambulatory Visit: Payer: Self-pay | Admitting: Internal Medicine

## 2011-11-05 ENCOUNTER — Ambulatory Visit (INDEPENDENT_AMBULATORY_CARE_PROVIDER_SITE_OTHER): Payer: Managed Care, Other (non HMO) | Admitting: Family

## 2011-11-05 ENCOUNTER — Encounter: Payer: Self-pay | Admitting: Family

## 2011-11-05 VITALS — BP 126/80 | HR 86 | Temp 98.6°F | Resp 16 | Wt 115.1 lb

## 2011-11-05 DIAGNOSIS — S39012A Strain of muscle, fascia and tendon of lower back, initial encounter: Secondary | ICD-10-CM

## 2011-11-05 DIAGNOSIS — S335XXA Sprain of ligaments of lumbar spine, initial encounter: Secondary | ICD-10-CM

## 2011-11-05 NOTE — Progress Notes (Signed)
Subjective:    Patient ID: Sherry Bautista, female    DOB: May 23, 1955, 56 y.o.   MRN: [id number]  HPI  Sherry Bautista is a 56 yr old female who presents today with chief complaint of back pain.  Pain is located in the lower back and is non-radiating. Pain is described as soreness.   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 She was unable to attend work yesterday and today and is requesting a letter for work.  Review of Systems See HPI  Past Medical History  Diagnosis Date  . Tobacco use disorder   . Depression   . Anxiety   . COPD (chronic obstructive pulmonary disease)   . Allergic rhinitis     allergy shots  w/o significant response  . Hyperglycemia     History   Social History  . Marital Status: Legally Separated    Spouse Name: N/A    Number of Children: N/A  . Years of Education: N/A   Occupational History  . [employer]    Social History Main Topics  . Smoking status: Current Every Day Smoker    Types: Cigarettes  . Smokeless tobacco: Never Used   Comment: started smoking at age 57.  Currently smoking 1ppd  . Alcohol Use: Yes     very rarely  . Drug Use: Not on file  . Sexually Active: Not on file   Other Topics Concern  . Not on file   Social History Narrative  . No narrative on file    Past Surgical History  Procedure Date  . Breast lumpectomy   . Nasal sinus surgery   . Tonsillectomy   . Knee surgery 2003    cartlage repair    Family History  Problem Relation Age of Onset  . Lung cancer Father   . COPD Father   . Hyperlipidemia Father   . Factor IX deficiency Mother     PTE  . Asthma Sister     Allergies  Allergen Reactions  . Azithromycin     rash  . Tetanus-Diphtheria Toxoids Td     Redness, swelling, stiffness of arm   . Codeine Sulfate     itching    Current Outpatient Prescriptions on File Prior to Visit  Medication Sig Dispense Refill  . albuterol (PROVENTIL  HFA;VENTOLIN HFA) 108 (90 BASE) MCG/ACT inhaler Inhale 2 puffs into the lungs every 4 (four) hours as needed for wheezing.  1 Inhaler  4  . ALPRAZolam (XANAX) 0.25 MG tablet Take 1 tablet (0.25 mg total) by mouth daily as needed.  30 tablet  0  . AMBULATORY NON FORMULARY MEDICATION Oxygen: 3 liters at rest and 4 liters with activity       . buPROPion (WELLBUTRIN SR) 150 MG 12 hr tablet Take 1 tablet (150 mg total) by mouth 2 (two) times daily.  60 tablet  2  . DULERA 200-5 MCG/ACT AERO USE 2 PUFFS EVERY 12 HOURS ; GARGLE & SPIT AFTER USE  13 g  5  . loratadine-pseudoephedrine (CLARITIN-D 12 HOUR) 5-120 MG per tablet Take 1 tablet by mouth at bedtime.  30 tablet  0  . PARoxetine (PAXIL-CR) 25 MG 24 hr tablet Take 1 tablet (25 mg total) by mouth 2 (two) times daily.  60 tablet  1  . SPIRIVA HANDIHALER 18 MCG inhalation capsule PLACE 1 CAPSULE INTO INHALER AND INHALE DAILY  1 each  5    BP 126/80  Pulse 86  Temp 98.6 F (37 C) (Oral)  Resp 16  Wt 115 lb 1.9 oz (52.218 kg)  SpO2 98%       Objective:   Physical Exam  Constitutional: She appears well-developed and well-nourished. No distress.  Cardiovascular: Normal rate and regular rhythm.   No murmur heard. Pulmonary/Chest: Effort normal and breath sounds normal. No respiratory distress. She has no wheezes. She has no rales. She exhibits no tenderness.  Musculoskeletal:       Right shoulder: She exhibits tenderness.       Lumbar back: She exhibits tenderness.       Bilateral lower extremity strength is 5/5  Neurological: She is alert.  Reflex Scores:      Patellar reflexes are 2+ on the right side and 2+ on the left side.      Achilles reflexes are 2+ on the right side and 2+ on the left side. Psychiatric: She has a normal mood and affect. Her behavior is normal. Judgment and thought content normal.          Assessment & Plan:

## 2011-11-05 NOTE — Assessment & Plan Note (Signed)
Continue heating pad prn, advil.  Should improve on own.  Note provided for work.

## 2011-11-05 NOTE — Patient Instructions (Addendum)

## 2011-11-06 ENCOUNTER — Telehealth: Payer: Self-pay | Admitting: *Deleted

## 2011-11-06 NOTE — Telephone Encounter (Signed)
OK to provide work note for 9/19.

## 2011-11-06 NOTE — Telephone Encounter (Signed)
Attempted to reach pt to let her know it would be tomorrow before request would be addressed as Provider is out of the office today and left message to return my call.

## 2011-11-06 NOTE — Telephone Encounter (Signed)
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  Please advise.

## 2011-11-07 NOTE — Telephone Encounter (Signed)
Notified pt that letter is ready for pick up and she requests that we fax it to Gwen Her @ [phone]. Letter faxed.

## 2011-11-11 ENCOUNTER — Telehealth: Payer: Self-pay | Admitting: Critical Care Medicine

## 2011-11-11 NOTE — Telephone Encounter (Signed)
Shanda Bumps from [employer] needs some clarification on disability forms and will be faxing over a few questions for Dr. Delford Field to fill out regarding this. Will forward to Crystal so she can follow-up on this.Asked that they fax this to triage.

## 2011-11-12 NOTE — Telephone Encounter (Signed)
Forms received and placed in PW's to do folder.  Will forward msg to him to address.

## 2011-11-12 NOTE — Telephone Encounter (Signed)
Will review 

## 2011-11-14 NOTE — Telephone Encounter (Signed)
Per Dr. Delford Field, this form needs to be sent to [employer].  I have placed it in the go backs to [employer].  Will call Shanda Bumps to inform her of this.

## 2011-11-17 ENCOUNTER — Telehealth: Payer: Self-pay | Admitting: Family

## 2011-11-17 ENCOUNTER — Ambulatory Visit (INDEPENDENT_AMBULATORY_CARE_PROVIDER_SITE_OTHER): Payer: Managed Care, Other (non HMO) | Admitting: Family

## 2011-11-17 ENCOUNTER — Encounter: Payer: Self-pay | Admitting: Family

## 2011-11-17 VITALS — BP 118/76 | HR 103 | Temp 98.3°F | Resp 18 | Wt 111.1 lb

## 2011-11-17 DIAGNOSIS — R51 Headache: Secondary | ICD-10-CM | POA: Insufficient documentation

## 2011-11-17 DIAGNOSIS — R519 Headache, unspecified: Secondary | ICD-10-CM | POA: Insufficient documentation

## 2011-11-17 MED ORDER — SUMATRIPTAN SUCCINATE 50 MG PO TABS: ORAL_TABLET | ORAL | Status: DC

## 2011-11-17 NOTE — Telephone Encounter (Signed)
Called, spoke with Sherry Bautista. Advised form was received and has been sent to [employer].  I provided her with [employer]'s # to f/u on this.  She verbalized undedrstanding and voiced no further questions/concerns at this time.

## 2011-11-17 NOTE — Telephone Encounter (Signed)
Called [facility] to check status of CT head result. They told me pt no showed her 3:45 apt.

## 2011-11-17 NOTE — Patient Instructions (Addendum)
Please complete your CT on the first floor.  Call if symptoms worsen or if symptoms do not improve.   Go to ER if symptoms become severe.

## 2011-11-17 NOTE — Progress Notes (Signed)
Subjective:    Patient ID: Sherry Bautista, female    DOB: 1955/10/27, 56 y.o.   MRN: [id number]  HPI  Sherry Bautista is a 56 yr old female who presents today with chief complaint of headache.  Headache has been present x 24 hours.  Reports that HA woke her up last night. She describes pain as severe; (pain 9/10 last night).  Not worst ha of her life.  Applied some wet packs which helped.  She took ibuprofen 800mg  every 6 hours.  This AM describes HA as between 7-8.  HA is located on both sides of head. She reports mild associated photophobia,  phonophobia.  Reports previous hx of migraines with similar symptoms.   Review of Systems See HPI  Past Medical History  Diagnosis Date  . Tobacco use disorder   . Depression   . Anxiety   . COPD (chronic obstructive pulmonary disease)   . Allergic rhinitis     allergy shots  w/o significant response  . Hyperglycemia     History   Social History  . Marital Status: Legally Separated    Spouse Name: N/A    Number of Children: N/A  . Years of Education: N/A   Occupational History  . [employer]    Social History Main Topics  . Smoking status: Current Every Day Smoker    Types: Cigarettes  . Smokeless tobacco: Never Used   Comment: started smoking at age 29.  Currently smoking 1ppd  . Alcohol Use: Yes     very rarely  . Drug Use: Not on file  . Sexually Active: Not on file   Other Topics Concern  . Not on file   Social History Narrative  . No narrative on file    Past Surgical History  Procedure Date  . Breast lumpectomy   . Nasal sinus surgery   . Tonsillectomy   . Knee surgery 2003    cartlage repair    Family History  Problem Relation Age of Onset  . Lung cancer Father   . COPD Father   . Hyperlipidemia Father   . Factor IX deficiency Mother     PTE  . Asthma Sister     Allergies  Allergen Reactions  . Azithromycin     rash  . Tetanus-Diphtheria Toxoids Td     Redness, swelling, stiffness of arm   .  Codeine Sulfate     itching    Current Outpatient Prescriptions on File Prior to Visit  Medication Sig Dispense Refill  . albuterol (PROVENTIL HFA;VENTOLIN HFA) 108 (90 BASE) MCG/ACT inhaler Inhale 2 puffs into the lungs every 4 (four) hours as needed for wheezing.  1 Inhaler  4  . ALPRAZolam (XANAX) 0.25 MG tablet Take 1 tablet (0.25 mg total) by mouth daily as needed.  30 tablet  0  . AMBULATORY NON FORMULARY MEDICATION Oxygen: 3 liters at rest and 4 liters with activity       . buPROPion (WELLBUTRIN SR) 150 MG 12 hr tablet Take 1 tablet (150 mg total) by mouth 2 (two) times daily.  60 tablet  2  . DULERA 200-5 MCG/ACT AERO USE 2 PUFFS EVERY 12 HOURS ; GARGLE & SPIT AFTER USE  13 g  5  . loratadine-pseudoephedrine (CLARITIN-D 12 HOUR) 5-120 MG per tablet Take 1 tablet by mouth at bedtime.  30 tablet  0  . PARoxetine (PAXIL-CR) 25 MG 24 hr tablet Take 1 tablet (25 mg total) by mouth 2 (two) times  daily.  60 tablet  1  . SPIRIVA HANDIHALER 18 MCG inhalation capsule PLACE 1 CAPSULE INTO INHALER AND INHALE DAILY  1 each  5    BP 118/76  Pulse 103  Temp 98.3 F (36.8 C) (Oral)  Resp 18  Wt 111 lb 1.9 oz (50.404 kg)  SpO2 99%       Objective:   Physical Exam  Constitutional: She is oriented to person, place, and time. She appears well-developed and well-nourished. No distress.  Eyes: EOM are normal. Pupils are equal, round, and reactive to light.  Neurological: She is alert and oriented to person, place, and time. She exhibits normal muscle tone. Coordination normal.  Reflex Scores:      Tricep reflexes are 2+ on the right side and 2+ on the left side.      Bicep reflexes are 2+ on the left side.      Brachioradialis reflexes are 3+ on the right side and 3+ on the left side.      Patellar reflexes are 3+ on the right side and 3+ on the left side. Skin: Skin is warm and dry.  Psychiatric: She has a normal mood and affect. Her behavior is normal. Judgment and thought content normal.            Assessment & Plan:

## 2011-11-17 NOTE — Assessment & Plan Note (Signed)
I am concerned that the headache woke her up from her sleep.  I have recommended CT head to rule out bleed.  This has been denied by her insurer.  They told me that they would facilitate appeal within 4 hours of my call.  I spoke with them this AM at 9:45.  If CT neg for bleed, plan rx with imitrex.  A note was provided for her employer.

## 2011-11-21 ENCOUNTER — Telehealth: Payer: Self-pay | Admitting: Critical Care Medicine

## 2011-11-21 ENCOUNTER — Encounter: Payer: Self-pay | Admitting: *Deleted

## 2011-11-21 NOTE — Telephone Encounter (Signed)
Returning call can be reached at [phone].Sherry Bautista

## 2011-11-21 NOTE — Telephone Encounter (Signed)
This is ok, crystal can you compose?

## 2011-11-21 NOTE — Telephone Encounter (Signed)
Letter completed and faxed to Gwen Her at [phone] (number provided above).    lmomtcb to inform pt

## 2011-11-21 NOTE — Telephone Encounter (Signed)
Called, spoke with pt.  Informed her letter was completed and faxed to [facility] at # provided.  She verbalized understanding.  Nothing further needed at this time.

## 2011-11-21 NOTE — Telephone Encounter (Signed)
lmomtcb  

## 2011-11-21 NOTE — Telephone Encounter (Signed)
I spoke with the pt and she states on 11-13-11 she ran out of oxygen, she states she misread how much liquid oxygen she had left. She states she had to miss work that day due to not having any oxygen and she had to wait for the delivery as well. Pt states she turned in the work order from her DME but her job is not accepting this as an excuse. Pt wants to know can Dr. Delford Field write a work excuse letter for 11-13-11. Pt states she returned to work on 11-14-11. Please advise.Carron Curie, CMA

## 2011-11-21 NOTE — Telephone Encounter (Signed)
Pt returned call. Sherry Bautista °

## 2011-11-26 ENCOUNTER — Encounter: Payer: Self-pay | Admitting: Family

## 2011-11-26 ENCOUNTER — Telehealth: Payer: Self-pay | Admitting: Family

## 2011-11-26 ENCOUNTER — Ambulatory Visit (HOSPITAL_BASED_OUTPATIENT_CLINIC_OR_DEPARTMENT_OTHER)
Admission: RE | Admit: 2011-11-26 | Discharge: 2011-11-26 | Disposition: A | Discharge status: Home / Self Care | Payer: Managed Care, Other (non HMO) | Source: Ambulatory Visit | Attending: Family | Admitting: Family

## 2011-11-26 ENCOUNTER — Other Ambulatory Visit (HOSPITAL_BASED_OUTPATIENT_CLINIC_OR_DEPARTMENT_OTHER): Payer: Self-pay | Admitting: Obstetrics and Gynecology

## 2011-11-26 ENCOUNTER — Ambulatory Visit (INDEPENDENT_AMBULATORY_CARE_PROVIDER_SITE_OTHER): Payer: Managed Care, Other (non HMO) | Admitting: Family

## 2011-11-26 VITALS — BP 128/78 | HR 98 | Temp 97.7°F | Resp 20 | Wt 111.0 lb

## 2011-11-26 DIAGNOSIS — R05 Cough: Secondary | ICD-10-CM

## 2011-11-26 DIAGNOSIS — R062 Wheezing: Secondary | ICD-10-CM | POA: Insufficient documentation

## 2011-11-26 DIAGNOSIS — R059 Cough, unspecified: Secondary | ICD-10-CM | POA: Insufficient documentation

## 2011-11-26 DIAGNOSIS — Z1231 Encounter for screening mammogram for malignant neoplasm of breast: Secondary | ICD-10-CM

## 2011-11-26 DIAGNOSIS — J441 Chronic obstructive pulmonary disease with (acute) exacerbation: Secondary | ICD-10-CM

## 2011-11-26 DIAGNOSIS — J438 Other emphysema: Secondary | ICD-10-CM | POA: Insufficient documentation

## 2011-11-26 MED ORDER — AMOXICILLIN-POT CLAVULANATE 875-125 MG PO TABS: 1.0000 | ORAL_TABLET | Freq: Two times a day (BID) | ORAL | Status: DC

## 2011-11-26 MED ORDER — METHYLPREDNISOLONE SODIUM SUCC 125 MG IJ SOLR
125.0000 mg | Freq: Once | INTRAMUSCULAR | Status: AC
  Administered 2011-11-26: 125 mg via INTRAMUSCULAR

## 2011-11-26 MED ORDER — PREDNISONE 10 MG PO TABS: ORAL_TABLET | ORAL | Status: DC

## 2011-11-26 NOTE — Telephone Encounter (Signed)
Notified pt. 

## 2011-11-26 NOTE — Assessment & Plan Note (Signed)
CXR is negative for pneumonia.  IM solumedrol given in office.  Continue albuterol, dulera, spiriva, add pred taper, continue oxygen.  Cover empirically for bronchitis with Augmentin.  Plan follow up in 1 week.

## 2011-11-26 NOTE — Telephone Encounter (Signed)
Please call pt and let her know that her chest xr-ray is negative for pneumonia.  I would like for her to start prednisone taper as well as augmentin to cover her for bronchitis please.

## 2011-11-26 NOTE — Patient Instructions (Addendum)
Please complete your chest x-ray on the first floor. We will contact you with your results. Stop smoking- this is very important for your health and breathing. Follow up in 1 week. Go to ER if you develop worsening shortness of breath.

## 2011-11-26 NOTE — Progress Notes (Signed)
Subjective:    Patient ID: Sherry Bautista, female    DOB: 1955/04/03, 56 y.o.   MRN: [id number]  HPI  Sherry Bautista is a 56 yr old female who presents today with chief complaint of cough. She reports cough started 1 week ago and has been productive at times of yellow/cream sputum. Feels that the cough is "Getting worse each day."  She is on 3 liters of oxygen, continues albuterol, dulera and spiriva.  Reports that spiriva is not working as well as it had been.     Review of Systems  Pt reports resolution of her headache.    see HPI  Past Medical History  Diagnosis Date  . Tobacco use disorder   . Depression   . Anxiety   . COPD (chronic obstructive pulmonary disease)   . Allergic rhinitis     allergy shots  w/o significant response  . Hyperglycemia     History   Social History  . Marital Status: Legally Separated    Spouse Name: N/A    Number of Children: N/A  . Years of Education: N/A   Occupational History  . [employer]    Social History Main Topics  . Smoking status: Current Every Day Smoker    Types: Cigarettes  . Smokeless tobacco: Never Used   Comment: started smoking at age 37.  Currently smoking 1ppd  . Alcohol Use: Yes     very rarely  . Drug Use: Not on file  . Sexually Active: Not on file   Other Topics Concern  . Not on file   Social History Narrative  . No narrative on file    Past Surgical History  Procedure Date  . Breast lumpectomy   . Nasal sinus surgery   . Tonsillectomy   . Knee surgery 2003    cartlage repair    Family History  Problem Relation Age of Onset  . Lung cancer Father   . COPD Father   . Hyperlipidemia Father   . Factor IX deficiency Mother     PTE  . Asthma Sister     Allergies  Allergen Reactions  . Azithromycin     rash  . Tetanus-Diphtheria Toxoids Td     Redness, swelling, stiffness of arm   . Codeine Sulfate     itching    Current Outpatient Prescriptions on File Prior to Visit  Medication Sig  Dispense Refill  . albuterol (PROVENTIL HFA;VENTOLIN HFA) 108 (90 BASE) MCG/ACT inhaler Inhale 2 puffs into the lungs every 4 (four) hours as needed for wheezing.  1 Inhaler  4  . ALPRAZolam (XANAX) 0.25 MG tablet Take 1 tablet (0.25 mg total) by mouth daily as needed.  30 tablet  0  . AMBULATORY NON FORMULARY MEDICATION Oxygen: 3 liters at rest and 4 liters with activity       . buPROPion (WELLBUTRIN SR) 150 MG 12 hr tablet Take 1 tablet (150 mg total) by mouth 2 (two) times daily.  60 tablet  2  . DULERA 200-5 MCG/ACT AERO USE 2 PUFFS EVERY 12 HOURS ; GARGLE & SPIT AFTER USE  13 g  5  . loratadine-pseudoephedrine (CLARITIN-D 12 HOUR) 5-120 MG per tablet Take 1 tablet by mouth at bedtime.  30 tablet  0  . PARoxetine (PAXIL-CR) 25 MG 24 hr tablet Take 1 tablet (25 mg total) by mouth 2 (two) times daily.  60 tablet  1  . SPIRIVA HANDIHALER 18 MCG inhalation capsule PLACE 1 CAPSULE INTO INHALER  AND INHALE DAILY  1 each  5  . SUMAtriptan (IMITREX) 50 MG tablet One tablet at start of migraine. May repeat 2 hours later.  Max 2 tabs in 24 hours.  10 tablet  0    BP 128/78  Pulse 98  Temp 97.7 F (36.5 C) (Oral)  Resp 20  Wt 111 lb 0.6 oz (50.367 kg)  SpO2 96%    Objective:   Physical Exam  Constitutional: She is oriented to person, place, and time. She appears well-developed and well-nourished. No distress.  Cardiovascular: Normal rate and regular rhythm.   Pulmonary/Chest: Effort normal. No respiratory distress. She has decreased breath sounds. She has wheezes.  Musculoskeletal: She exhibits no edema.  Neurological: She is alert and oriented to person, place, and time.  Psychiatric: She has a normal mood and affect. Her behavior is normal. Judgment and thought content normal.          Assessment & Plan:

## 2011-11-28 ENCOUNTER — Ambulatory Visit: Payer: Managed Care, Other (non HMO) | Admitting: Family

## 2011-11-30 ENCOUNTER — Other Ambulatory Visit: Payer: Self-pay | Admitting: Family

## 2011-12-01 ENCOUNTER — Other Ambulatory Visit: Payer: Self-pay | Admitting: Internal Medicine

## 2011-12-01 NOTE — Telephone Encounter (Signed)
Refills x 2 last ov wt/hopper 8.9.12 CPE NOTE pt has been seeing [name] and refills have been requested for medications from this patient for Melissa to Authorize  1-Paroxetine CR 25MG  Ter #60 Take one tablet by mouth 2 times daily last fill 10.13.13--(per Fax)  2-Alprazolam 0.25mg  tab #30 Take one tablet by mouth daily as needed last fill 07.23.13

## 2011-12-01 NOTE — Telephone Encounter (Signed)
Both medications were called in  1. #60/1 refill 2.) #30/0 refill  Patient with pending appointment 01/2012

## 2011-12-03 ENCOUNTER — Ambulatory Visit (HOSPITAL_BASED_OUTPATIENT_CLINIC_OR_DEPARTMENT_OTHER): Payer: Managed Care, Other (non HMO)

## 2011-12-03 ENCOUNTER — Ambulatory Visit: Payer: Managed Care, Other (non HMO) | Admitting: Family

## 2011-12-05 ENCOUNTER — Other Ambulatory Visit: Payer: Self-pay | Admitting: Critical Care Medicine

## 2011-12-08 ENCOUNTER — Ambulatory Visit: Payer: Managed Care, Other (non HMO) | Admitting: Family

## 2011-12-10 ENCOUNTER — Other Ambulatory Visit: Payer: Self-pay | Admitting: *Deleted

## 2011-12-10 DIAGNOSIS — J449 Chronic obstructive pulmonary disease, unspecified: Secondary | ICD-10-CM

## 2011-12-10 MED ORDER — LORATADINE-PSEUDOEPHEDRINE ER 5-120 MG PO TB12: 1.0000 | ORAL_TABLET | Freq: Every day | ORAL | Status: DC

## 2012-01-19 ENCOUNTER — Ambulatory Visit: Payer: Managed Care, Other (non HMO) | Admitting: Critical Care Medicine

## 2012-01-22 ENCOUNTER — Ambulatory Visit: Payer: Managed Care, Other (non HMO) | Admitting: Critical Care Medicine

## 2012-01-23 ENCOUNTER — Encounter: Payer: Managed Care, Other (non HMO) | Admitting: Internal Medicine

## 2012-01-29 ENCOUNTER — Encounter: Payer: Self-pay | Admitting: *Deleted

## 2012-01-29 ENCOUNTER — Ambulatory Visit: Payer: Managed Care, Other (non HMO) | Admitting: Critical Care Medicine

## 2012-02-02 ENCOUNTER — Ambulatory Visit: Payer: Managed Care, Other (non HMO) | Admitting: Critical Care Medicine

## 2012-02-05 ENCOUNTER — Ambulatory Visit: Payer: Managed Care, Other (non HMO) | Admitting: Critical Care Medicine

## 2012-02-12 ENCOUNTER — Telehealth: Payer: Self-pay | Admitting: Internal Medicine

## 2012-02-12 NOTE — Telephone Encounter (Signed)
Dismissal Letter sent by Certified Mail 02/13/2012  Dismissal Letter returned Unclaimed 03/01/2012  Dismissal Letter sent by 1st Class Mail to address [address] [place] on 03/02/2012

## 2012-03-04 ENCOUNTER — Ambulatory Visit (INDEPENDENT_AMBULATORY_CARE_PROVIDER_SITE_OTHER): Payer: Managed Care, Other (non HMO) | Admitting: Critical Care Medicine

## 2012-03-04 ENCOUNTER — Encounter: Payer: Self-pay | Admitting: Critical Care Medicine

## 2012-03-04 ENCOUNTER — Telehealth: Payer: Self-pay | Admitting: *Deleted

## 2012-03-04 VITALS — BP 152/84 | HR 128 | Temp 98.0°F | Ht 64.0 in | Wt 118.0 lb

## 2012-03-04 DIAGNOSIS — J9611 Chronic respiratory failure with hypoxia: Secondary | ICD-10-CM | POA: Insufficient documentation

## 2012-03-04 DIAGNOSIS — R0902 Hypoxemia: Secondary | ICD-10-CM

## 2012-03-04 DIAGNOSIS — J449 Chronic obstructive pulmonary disease, unspecified: Secondary | ICD-10-CM

## 2012-03-04 DIAGNOSIS — F17200 Nicotine dependence, unspecified, uncomplicated: Secondary | ICD-10-CM

## 2012-03-04 DIAGNOSIS — J9610 Chronic respiratory failure, unspecified whether with hypoxia or hypercapnia: Secondary | ICD-10-CM

## 2012-03-04 DIAGNOSIS — R079 Chest pain, unspecified: Secondary | ICD-10-CM

## 2012-03-04 DIAGNOSIS — Z23 Encounter for immunization: Secondary | ICD-10-CM

## 2012-03-04 MED ORDER — MOMETASONE FURO-FORMOTEROL FUM 200-5 MCG/ACT IN AERO: 2.0000 | INHALATION_SPRAY | Freq: Two times a day (BID) | RESPIRATORY_TRACT | Status: DC

## 2012-03-04 MED ORDER — ALBUTEROL SULFATE HFA 108 (90 BASE) MCG/ACT IN AERS: 2.0000 | INHALATION_SPRAY | RESPIRATORY_TRACT | Status: DC | PRN

## 2012-03-04 MED ORDER — TIOTROPIUM BROMIDE MONOHYDRATE 18 MCG IN CAPS: 18.0000 ug | ORAL_CAPSULE | Freq: Every day | RESPIRATORY_TRACT | Status: DC

## 2012-03-04 MED ORDER — PAROXETINE HCL ER 25 MG PO TB24: 25.0000 mg | ORAL_TABLET | Freq: Two times a day (BID) | ORAL | Status: DC

## 2012-03-04 NOTE — Telephone Encounter (Signed)
Called, spoke with pt.  Pt states she is taking the paxil cr 25 mg 1 po bid.  States Dr. Alwyn Ren was the last prescribing md for this medication but no longer sees him.  Spoke with Dr. Delford Field -- ok to fill medication as requested but pt will need to establish with PCP for future fills.  Pt aware paxil cr 25 mg rx for 1 po bid sent to pharm, and she will need to establish with PCP for additional rxs.  She verbalized understanding of this and voiced no further questions or concerns at this time.

## 2012-03-04 NOTE — Patient Instructions (Addendum)
Samples of Spiriva and dulera given Stop smoking, use Nicotrol A referral to Cardiology will be made at the [facility] office Flu and pneumonia vaccines were given Return 4 months

## 2012-03-04 NOTE — Assessment & Plan Note (Signed)
Gold stage C. COPD oxygen dependent and stable at this time Ongoing tobacco use Plan Samples of Spiriva and dulera given Stop smoking, use Nicotrol A referral to Cardiology will be made at the [facility] office Flu and pneumonia vaccines were given Return 4 months

## 2012-03-04 NOTE — Telephone Encounter (Signed)
Pt requesting [place] to refill paxil during OV.  Advised I would speak with PW regarding this and would call her back.  Pt has paxil cr 25 mg bid on med list.  This is above average dosing.  Per PW, we need to find out exactly what she is taking, but she will need to establish with a PCP dr for future fills.  I have lmomtcb for pt for additional clarification on the dosage strength and frequency.

## 2012-03-04 NOTE — Assessment & Plan Note (Signed)
Ongoing tobacco use 310 minutes smoking cessation counseling was issued to the patient

## 2012-03-04 NOTE — Progress Notes (Signed)
Subjective:    Patient ID: Sherry Bautista, female    DOB: September 10, 1955, 57 y.o.   MRN: [id number]  HPI  57 y.o. .WF  With known hx Dx of copd, active smoker -O2 dependent    03/04/2012 Pt notes chest pain with activity , does not radiate.  Only comes on with activity. No CAD dx in the past. Pt is still smoking  1PPD.  Cough is productive cream color.  Dyspneic with exertion only No blood in sputum.  No vaccines yet.   Review of Systems  11pt ros neg except per hpi    Objective:   Physical Exam  Filed Vitals:   03/04/12 0939  BP: 152/84  Pulse: 128  Temp: 98 F (36.7 C)  Height: 5\' 4"  (1.626 m)  Weight: 118 lb (53.524 kg)  SpO2: 93%    Gen: Pleasant, well-nourished, in no distress,  normal affect  ENT: No lesions,  mouth clear,  oropharynx clear, no postnasal drip  Neck: No JVD, no TMG, no carotid bruits  Lungs: No use of accessory muscles, no dullness to percussion, distant breath sounds and scattered rhonchi Cardiovascular: RRR, heart sounds normal, no murmur or gallops, no peripheral edema  Abdomen: soft and NT, no HSM,  BS normal  Musculoskeletal: No deformities, no cyanosis or clubbing  Neuro: alert, non focal  Skin: Warm, no lesions or rashes  No results found.        Assessment & Plan:   COPD Gold C Gold stage C. COPD oxygen dependent and stable at this time Ongoing tobacco use Plan Samples of Spiriva and dulera given Stop smoking, use Nicotrol A referral to Cardiology will be made at the [facility] office Flu and pneumonia vaccines were given Return 4 months   Tobacco use disorder Ongoing tobacco use 310 minutes smoking cessation counseling was issued to the patient    Updated Medication List Outpatient Encounter Prescriptions as of 03/04/2012  Medication Sig Dispense Refill  . ALPRAZolam (XANAX) 0.25 MG tablet Take 1 tablet (0.25 mg total) by mouth daily as needed.  30 tablet  0  . AMBULATORY NON FORMULARY MEDICATION Oxygen: 3 liters  at rest and 4 liters with activity       . buPROPion (WELLBUTRIN SR) 150 MG 12 hr tablet Take 1 tablet (150 mg total) by mouth 2 (two) times daily.  60 tablet  2  . DULERA 200-5 MCG/ACT AERO USE 2 PUFFS EVERY 12 HOURS ; GARGLE & SPIT AFTER USE  13 g  5  . loratadine-pseudoephedrine (CLARITIN-D 12 HOUR) 5-120 MG per tablet Take 1 tablet by mouth at bedtime.  30 tablet  2  . PARoxetine (PAXIL-CR) 25 MG 24 hr tablet Take 1 tablet (25 mg total) by mouth 2 (two) times daily.  60 tablet  1  . SPIRIVA HANDIHALER 18 MCG inhalation capsule PLACE 1 CAPSULE INTO INHALER AND INHALE DAILY  1 each  5  . SUMAtriptan (IMITREX) 50 MG tablet One tablet at start of migraine. May repeat 2 hours later.  Max 2 tabs in 24 hours.  10 tablet  0  . [DISCONTINUED] PROVENTIL HFA 108 (90 BASE) MCG/ACT inhaler INHALE 2 PUFFS INTO THE LUNGS EVERY 4 (FOUR) HOURS AS NEEDED FOR WHEEZING.  1 Inhaler  3  . albuterol (PROAIR HFA) 108 (90 BASE) MCG/ACT inhaler Inhale 2 puffs into the lungs every 4 (four) hours as needed.  1 Inhaler  3  . mometasone-formoterol (DULERA) 200-5 MCG/ACT AERO Inhale 2 puffs into the lungs 2 (  two) times daily.  2 Inhaler  0  . tiotropium (SPIRIVA HANDIHALER) 18 MCG inhalation capsule Place 1 capsule (18 mcg total) into inhaler and inhale daily.  20 capsule  0  . [DISCONTINUED] amoxicillin-clavulanate (AUGMENTIN) 875-125 MG per tablet Take 1 tablet by mouth 2 (two) times daily.  20 tablet  0  . [DISCONTINUED] buPROPion (WELLBUTRIN SR) 150 MG 12 hr tablet TAKE 1 TABLET (150 MG TOTAL) BY MOUTH 2 (TWO) TIMES DAILY.  60 tablet  0  . [DISCONTINUED] predniSONE (DELTASONE) 10 MG tablet 4 tabs once daily for 2 days, 3 tabs daily x 2 days, 2 tabs daily for 2 days, 1 tab daily x 2 days then stop  20 tablet  0

## 2012-03-17 ENCOUNTER — Telehealth: Payer: Self-pay | Admitting: Internal Medicine

## 2012-03-17 NOTE — Telephone Encounter (Signed)
I spoke with patient, patient was given Dr.Hopper's recommendations

## 2012-03-17 NOTE — Telephone Encounter (Signed)
I see Dr Arlyce Dice & my wife has seen Dr Darnelle Catalan. Dr Myna Hidalgo & Cyndie Chime are also excellent

## 2012-03-17 NOTE — Telephone Encounter (Signed)
Hopp please advise  

## 2012-03-17 NOTE — Telephone Encounter (Signed)
Patient would like to know who Dr. Alwyn Ren would recommend for a gastroenterologist and oncologist in [place]. Pt states she might have rectal cancer.

## 2012-03-31 ENCOUNTER — Ambulatory Visit: Payer: Managed Care, Other (non HMO) | Admitting: Cardiology

## 2012-04-30 ENCOUNTER — Ambulatory Visit (INDEPENDENT_AMBULATORY_CARE_PROVIDER_SITE_OTHER): Payer: Self-pay | Admitting: General Surgery

## 2012-05-11 ENCOUNTER — Ambulatory Visit (INDEPENDENT_AMBULATORY_CARE_PROVIDER_SITE_OTHER): Payer: Managed Care, Other (non HMO) | Admitting: General Surgery

## 2012-05-11 ENCOUNTER — Encounter (INDEPENDENT_AMBULATORY_CARE_PROVIDER_SITE_OTHER): Payer: Self-pay | Admitting: General Surgery

## 2012-05-11 VITALS — BP 140/82 | HR 120 | Temp 98.4°F | Resp 30 | Ht 64.0 in | Wt 118.4 lb

## 2012-05-11 DIAGNOSIS — K6289 Other specified diseases of anus and rectum: Secondary | ICD-10-CM

## 2012-05-11 DIAGNOSIS — K629 Disease of anus and rectum, unspecified: Secondary | ICD-10-CM

## 2012-05-11 DIAGNOSIS — C801 Malignant (primary) neoplasm, unspecified: Secondary | ICD-10-CM

## 2012-05-11 HISTORY — DX: Malignant (primary) neoplasm, unspecified: C80.1

## 2012-05-11 NOTE — Patient Instructions (Signed)
Use the bowel prep instructions that you were given on the day before your procedure.  Take 2 dulcolax tabs before you start the bowel prep.  Only take in clear liquids during that day and nothing to eat or drink after midnight.

## 2012-05-11 NOTE — Progress Notes (Signed)
Chief Complaint  Patient presents with  . Other    Rectovaginal fistula    HISTORY: Sherry Bautista is a 57 y.o. female who presents to the office with a rectovaginal fistula.  This was found a gynecology exam.  She also saw a surgeon at [facility] that took a biopsy and showed "precancerous cells".  She has never had a colonoscopy.  She has noticed some recent rectal bleeding.  Denies weight loss.  She is having some more difficulty with BM's recently.  She is having to strain more.    Past Medical History  Diagnosis Date  . Tobacco use disorder   . Depression   . Anxiety   . COPD (chronic obstructive pulmonary disease)   . Allergic rhinitis     allergy shots  w/o significant response  . Hyperglycemia       Past Surgical History  Procedure Laterality Date  . Breast lumpectomy    . Nasal sinus surgery    . Tonsillectomy    . Knee surgery  2003    cartlage repair      Current Outpatient Prescriptions  Medication Sig Dispense Refill  . albuterol (PROAIR HFA) 108 (90 BASE) MCG/ACT inhaler Inhale 2 puffs into the lungs every 4 (four) hours as needed.  1 Inhaler  3  . ALPRAZolam (XANAX) 0.25 MG tablet Take 1 tablet (0.25 mg total) by mouth daily as needed.  30 tablet  0  . AMBULATORY NON FORMULARY MEDICATION Oxygen: 3 liters at rest and 4 liters with activity       . buPROPion (WELLBUTRIN SR) 150 MG 12 hr tablet Take 1 tablet (150 mg total) by mouth 2 (two) times daily.  60 tablet  2  . DULERA 200-5 MCG/ACT AERO USE 2 PUFFS EVERY 12 HOURS ; GARGLE & SPIT AFTER USE  13 g  5  . mometasone-formoterol (DULERA) 200-5 MCG/ACT AERO Inhale 2 puffs into the lungs 2 (two) times daily.  2 Inhaler  0  . PARoxetine (PAXIL-CR) 25 MG 24 hr tablet Take 1 tablet (25 mg total) by mouth 2 (two) times daily.  60 tablet  3  . SPIRIVA HANDIHALER 18 MCG inhalation capsule PLACE 1 CAPSULE INTO INHALER AND INHALE DAILY  1 each  5  . tiotropium (SPIRIVA HANDIHALER) 18 MCG inhalation capsule Place 1 capsule  (18 mcg total) into inhaler and inhale daily.  20 capsule  0   No current facility-administered medications for this visit.      Allergies  Allergen Reactions  . Azithromycin     rash  . Tetanus-Diphtheria Toxoids Td     Redness, swelling, stiffness of arm   . Codeine Sulfate     itching      Family History  Problem Relation Age of Onset  . Lung cancer Father   . COPD Father   . Hyperlipidemia Father   . Factor IX deficiency Mother     PTE  . Asthma Sister       History   Social History  . Marital Status: Divorced    Spouse Name: N/A    Number of Children: N/A  . Years of Education: N/A   Occupational History  . [employer]    Social History Main Topics  . Smoking status: Current Every Day Smoker -- 1.00 packs/day for 30 years    Types: Cigarettes  . Smokeless tobacco: Never Used     Comment: started smoking at age 46.  Currently smoking 1ppd  . Alcohol  Use: No     Comment: very rarely  . Drug Use: No  . Sexually Active: None   Other Topics Concern  . None   Social History Narrative  . None       REVIEW OF SYSTEMS - PERTINENT POSITIVES ONLY: Review of Systems - General ROS: negative for - chills or fever Hematological and Lymphatic ROS: negative for - bleeding problems, blood clots or weight loss Respiratory ROS: positive for - cough and shortness of breath negative for - sputum changes Cardiovascular ROS: no chest pain or dyspnea on exertion Gastrointestinal ROS: positive for - blood in stools and change in bowel habits negative for - appetite loss, diarrhea or nausea/vomiting Genito-Urinary ROS: no dysuria, trouble voiding, or hematuria  EXAM: Filed Vitals:   05/11/12 1125  BP: 140/82  Pulse: 120  Temp: 98.4 F (36.9 C)  Resp: 30    Gen:  No acute distress.  Well nourished and well groomed.   Neurological: Alert and oriented to person, place, and time. Coordination normal.  Head: Normocephalic and atraumatic.  Eyes: Conjunctivae are  normal. Pupils are equal, round, and reactive to light. No scleral icterus.  Neck: Normal range of motion. Neck supple. No tracheal deviation or thyromegaly present.  No cervical lymphadenopathy. Cardiovascular: Normal rate, regular rhythm, normal heart sounds and intact distal pulses.  Exam reveals no gallop and no friction rub.  No murmur heard. Respiratory: Effort normal.  No respiratory distress. No chest wall tenderness. Breath sounds normal.  No wheezes, rales or rhonchi.  GI: Soft. Bowel sounds are normal. The abdomen is soft and nontender.  There is no rebound and no guarding.  Musculoskeletal: Normal range of motion. Extremities are nontender. R inguinal lyphadenopathy present Skin: Skin is warm and dry. No rash noted. No diaphoresis. No erythema. No pallor. No clubbing, cyanosis, or edema.   Psychiatric: Normal mood and affect. Behavior is normal. Judgment and thought content normal.   Procedure: Anoscopy Surgeon: Maisie Fus Assistant: Christella Scheuermann After the risks and benefits were explained, verbal consent was obtained for above procedure  Anesthesia: none Diagnosis: RVF, rectal bleeding Findings: large anterior anal mass, biopsy taken with forceps, hemostasis achieved with pressure.    ASSESSMENT AND PLAN: Sherry Bautista is a 57 y.o. F with COPD who has an anterior anal mass with rectovaginal fistula.  This is concerning for anal cancer.  We performed a biopsy in the office and will schedule her for colonoscopy ASAP.  I will address the treatment of this with her further once the biopsy results are back.  I strongly suspsect this is an anal cancer.  If the biopsy is positive, she will need a referral to oncology and radiation oncology, as well as imaging of her chest, abd and pelvis.    Vanita Panda, MD Colon and Rectal Surgery / General Surgery [facility]      Visit Diagnoses: 1. Anal lesion     Primary Care Physician: Marga Melnick, MD

## 2012-05-14 ENCOUNTER — Other Ambulatory Visit (INDEPENDENT_AMBULATORY_CARE_PROVIDER_SITE_OTHER): Payer: Self-pay

## 2012-05-14 ENCOUNTER — Telehealth (INDEPENDENT_AMBULATORY_CARE_PROVIDER_SITE_OTHER): Payer: Self-pay | Admitting: General Surgery

## 2012-05-14 DIAGNOSIS — C210 Malignant neoplasm of anus, unspecified: Secondary | ICD-10-CM

## 2012-05-14 NOTE — Telephone Encounter (Signed)
Attempted to call patient regarding her path results.  Unable to reach patient at numbers provided

## 2012-05-17 ENCOUNTER — Telehealth (INDEPENDENT_AMBULATORY_CARE_PROVIDER_SITE_OTHER): Payer: Self-pay | Admitting: General Surgery

## 2012-05-17 NOTE — Telephone Encounter (Signed)
Appt were made for this patient for Pet and CT scan however she will not be able to  Keep appt  Due to insurance issue  . She will call as soon as her insurance has been approved

## 2012-05-18 ENCOUNTER — Telehealth: Payer: Self-pay | Admitting: Oncology

## 2012-05-18 NOTE — Telephone Encounter (Signed)
SCHEDULE PER GINA.  WELCOME PACKET MAILED.

## 2012-05-19 ENCOUNTER — Telehealth: Payer: Self-pay | Admitting: Oncology

## 2012-05-19 NOTE — Telephone Encounter (Signed)
Spoke with patient and confirmed appointment with Dr. Alisia Ferrari for 06/07/12.  Contact names and numbers provided.

## 2012-05-19 NOTE — Telephone Encounter (Signed)
C/D 05/19/12 for appt. 06/07/12

## 2012-05-20 ENCOUNTER — Ambulatory Visit: Payer: Managed Care, Other (non HMO) | Admitting: Radiation Oncology

## 2012-05-20 ENCOUNTER — Ambulatory Visit: Payer: Managed Care, Other (non HMO)

## 2012-05-21 ENCOUNTER — Other Ambulatory Visit (HOSPITAL_COMMUNITY): Payer: Managed Care, Other (non HMO)

## 2012-05-31 ENCOUNTER — Encounter (HOSPITAL_COMMUNITY): Payer: Self-pay | Admitting: *Deleted

## 2012-05-31 ENCOUNTER — Encounter (HOSPITAL_COMMUNITY)
Admission: RE | Disposition: A | Discharge status: Home / Self Care | Payer: Self-pay | Source: Ambulatory Visit | Attending: General Surgery

## 2012-05-31 ENCOUNTER — Ambulatory Visit (HOSPITAL_COMMUNITY)
Admission: RE | Admit: 2012-05-31 | Discharge: 2012-05-31 | Disposition: A | Discharge status: Home / Self Care | Payer: BC Managed Care – PPO | Source: Ambulatory Visit | Attending: General Surgery | Admitting: General Surgery

## 2012-05-31 DIAGNOSIS — K620 Anal polyp: Secondary | ICD-10-CM

## 2012-05-31 DIAGNOSIS — Z888 Allergy status to other drugs, medicaments and biological substances status: Secondary | ICD-10-CM | POA: Insufficient documentation

## 2012-05-31 DIAGNOSIS — N824 Other female intestinal-genital tract fistulae: Secondary | ICD-10-CM | POA: Insufficient documentation

## 2012-05-31 DIAGNOSIS — Z9981 Dependence on supplemental oxygen: Secondary | ICD-10-CM | POA: Insufficient documentation

## 2012-05-31 DIAGNOSIS — F329 Major depressive disorder, single episode, unspecified: Secondary | ICD-10-CM | POA: Insufficient documentation

## 2012-05-31 DIAGNOSIS — Z79899 Other long term (current) drug therapy: Secondary | ICD-10-CM | POA: Insufficient documentation

## 2012-05-31 DIAGNOSIS — Z887 Allergy status to serum and vaccine status: Secondary | ICD-10-CM | POA: Insufficient documentation

## 2012-05-31 DIAGNOSIS — F17200 Nicotine dependence, unspecified, uncomplicated: Secondary | ICD-10-CM | POA: Insufficient documentation

## 2012-05-31 DIAGNOSIS — K5730 Diverticulosis of large intestine without perforation or abscess without bleeding: Secondary | ICD-10-CM | POA: Insufficient documentation

## 2012-05-31 DIAGNOSIS — D490 Neoplasm of unspecified behavior of digestive system: Secondary | ICD-10-CM | POA: Insufficient documentation

## 2012-05-31 DIAGNOSIS — Z885 Allergy status to narcotic agent status: Secondary | ICD-10-CM | POA: Insufficient documentation

## 2012-05-31 DIAGNOSIS — K625 Hemorrhage of anus and rectum: Secondary | ICD-10-CM | POA: Insufficient documentation

## 2012-05-31 DIAGNOSIS — F3289 Other specified depressive episodes: Secondary | ICD-10-CM | POA: Insufficient documentation

## 2012-05-31 DIAGNOSIS — J309 Allergic rhinitis, unspecified: Secondary | ICD-10-CM | POA: Insufficient documentation

## 2012-05-31 DIAGNOSIS — Z1211 Encounter for screening for malignant neoplasm of colon: Secondary | ICD-10-CM | POA: Insufficient documentation

## 2012-05-31 DIAGNOSIS — K621 Rectal polyp: Secondary | ICD-10-CM | POA: Insufficient documentation

## 2012-05-31 DIAGNOSIS — F411 Generalized anxiety disorder: Secondary | ICD-10-CM | POA: Insufficient documentation

## 2012-05-31 DIAGNOSIS — R7309 Other abnormal glucose: Secondary | ICD-10-CM | POA: Insufficient documentation

## 2012-05-31 HISTORY — DX: Malignant (primary) neoplasm, unspecified: C80.1

## 2012-05-31 HISTORY — PX: COLONOSCOPY: SHX5424

## 2012-05-31 SURGERY — COLONOSCOPY
Anesthesia: Moderate Sedation

## 2012-05-31 MED ORDER — LIDOCAINE VISCOUS 2 % MT SOLN
OROMUCOSAL | Status: DC | PRN
  Administered 2012-05-31: 1 via OROMUCOSAL

## 2012-05-31 MED ORDER — LIDOCAINE VISCOUS 2 % MT SOLN
OROMUCOSAL | Status: AC
  Filled 2012-05-31: qty 15

## 2012-05-31 MED ORDER — FENTANYL CITRATE 0.05 MG/ML IJ SOLN
INTRAMUSCULAR | Status: AC
  Filled 2012-05-31: qty 4

## 2012-05-31 MED ORDER — SODIUM CHLORIDE 0.9 % IV SOLN
INTRAVENOUS | Status: DC
  Administered 2012-05-31: 08:00:00 via INTRAVENOUS

## 2012-05-31 MED ORDER — DIPHENHYDRAMINE HCL 50 MG/ML IJ SOLN
INTRAMUSCULAR | Status: AC
  Filled 2012-05-31: qty 1

## 2012-05-31 MED ORDER — MIDAZOLAM HCL 10 MG/2ML IJ SOLN
INTRAMUSCULAR | Status: DC | PRN
  Administered 2012-05-31 (×2): 1 mg via INTRAVENOUS
  Administered 2012-05-31: 2 mg via INTRAVENOUS
  Administered 2012-05-31 (×2): 1 mg via INTRAVENOUS

## 2012-05-31 MED ORDER — FENTANYL CITRATE 0.05 MG/ML IJ SOLN
INTRAMUSCULAR | Status: DC | PRN
  Administered 2012-05-31 (×4): 25 ug via INTRAVENOUS

## 2012-05-31 MED ORDER — MIDAZOLAM HCL 10 MG/2ML IJ SOLN
INTRAMUSCULAR | Status: AC
  Filled 2012-05-31: qty 4

## 2012-05-31 NOTE — Interval H&P Note (Signed)
History and Physical Interval Note:  05/31/2012 7:22 AM  Sherry Bautista  has presented today for surgery, with the diagnosis of anal mass rectal bleeding   The various methods of treatment have been discussed with the patient and family. After consideration of risks, benefits and other options for treatment, the patient has consented to  Procedure(s): COLONOSCOPY (N/A) as a surgical intervention .  The patient's history has been reviewed, patient examined, no change in status, stable for surgery.  I have reviewed the patient's chart and labs.  Questions were answered to the patient's satisfaction.  We discussed the procedure and it's risks and benefits.  We discussed a severe bleeding rate of 1:5,000 and risk of severe perforation at 1:10,000.   Vanita Panda, MD  Colorectal and General Surgery [facility]

## 2012-05-31 NOTE — H&P (View-Only) (Signed)
Chief Complaint  Patient presents with  . Other    Rectovaginal fistula    HISTORY: Sherry Bautista is a 57 y.o. female who presents to the office with a rectovaginal fistula.  This was found a gynecology exam.  She also saw a surgeon at [facility] that took a biopsy and showed "precancerous cells".  She has never had a colonoscopy.  She has noticed some recent rectal bleeding.  Denies weight loss.  She is having some more difficulty with BM's recently.  She is having to strain more.    Past Medical History  Diagnosis Date  . Tobacco use disorder   . Depression   . Anxiety   . COPD (chronic obstructive pulmonary disease)   . Allergic rhinitis     allergy shots  w/o significant response  . Hyperglycemia       Past Surgical History  Procedure Laterality Date  . Breast lumpectomy    . Nasal sinus surgery    . Tonsillectomy    . Knee surgery  2003    cartlage repair      Current Outpatient Prescriptions  Medication Sig Dispense Refill  . albuterol (PROAIR HFA) 108 (90 BASE) MCG/ACT inhaler Inhale 2 puffs into the lungs every 4 (four) hours as needed.  1 Inhaler  3  . ALPRAZolam (XANAX) 0.25 MG tablet Take 1 tablet (0.25 mg total) by mouth daily as needed.  30 tablet  0  . AMBULATORY NON FORMULARY MEDICATION Oxygen: 3 liters at rest and 4 liters with activity       . buPROPion (WELLBUTRIN SR) 150 MG 12 hr tablet Take 1 tablet (150 mg total) by mouth 2 (two) times daily.  60 tablet  2  . DULERA 200-5 MCG/ACT AERO USE 2 PUFFS EVERY 12 HOURS ; GARGLE & SPIT AFTER USE  13 g  5  . mometasone-formoterol (DULERA) 200-5 MCG/ACT AERO Inhale 2 puffs into the lungs 2 (two) times daily.  2 Inhaler  0  . PARoxetine (PAXIL-CR) 25 MG 24 hr tablet Take 1 tablet (25 mg total) by mouth 2 (two) times daily.  60 tablet  3  . SPIRIVA HANDIHALER 18 MCG inhalation capsule PLACE 1 CAPSULE INTO INHALER AND INHALE DAILY  1 each  5  . tiotropium (SPIRIVA HANDIHALER) 18 MCG inhalation capsule Place 1 capsule  (18 mcg total) into inhaler and inhale daily.  20 capsule  0   No current facility-administered medications for this visit.      Allergies  Allergen Reactions  . Azithromycin     rash  . Tetanus-Diphtheria Toxoids Td     Redness, swelling, stiffness of arm   . Codeine Sulfate     itching      Family History  Problem Relation Age of Onset  . Lung cancer Father   . COPD Father   . Hyperlipidemia Father   . Factor IX deficiency Mother     PTE  . Asthma Sister       History   Social History  . Marital Status: Divorced    Spouse Name: N/A    Number of Children: N/A  . Years of Education: N/A   Occupational History  . [employer]    Social History Main Topics  . Smoking status: Current Every Day Smoker -- 1.00 packs/day for 30 years    Types: Cigarettes  . Smokeless tobacco: Never Used     Comment: started smoking at age 18.  Currently smoking 1ppd  . Alcohol   Use: No     Comment: very rarely  . Drug Use: No  . Sexually Active: None   Other Topics Concern  . None   Social History Narrative  . None       REVIEW OF SYSTEMS - PERTINENT POSITIVES ONLY: Review of Systems - General ROS: negative for - chills or fever Hematological and Lymphatic ROS: negative for - bleeding problems, blood clots or weight loss Respiratory ROS: positive for - cough and shortness of breath negative for - sputum changes Cardiovascular ROS: no chest pain or dyspnea on exertion Gastrointestinal ROS: positive for - blood in stools and change in bowel habits negative for - appetite loss, diarrhea or nausea/vomiting Genito-Urinary ROS: no dysuria, trouble voiding, or hematuria  EXAM: Filed Vitals:   05/11/12 1125  BP: 140/82  Pulse: 120  Temp: 98.4 F (36.9 C)  Resp: 30    Gen:  No acute distress.  Well nourished and well groomed.   Neurological: Alert and oriented to person, place, and time. Coordination normal.  Head: Normocephalic and atraumatic.  Eyes: Conjunctivae are  normal. Pupils are equal, round, and reactive to light. No scleral icterus.  Neck: Normal range of motion. Neck supple. No tracheal deviation or thyromegaly present.  No cervical lymphadenopathy. Cardiovascular: Normal rate, regular rhythm, normal heart sounds and intact distal pulses.  Exam reveals no gallop and no friction rub.  No murmur heard. Respiratory: Effort normal.  No respiratory distress. No chest wall tenderness. Breath sounds normal.  No wheezes, rales or rhonchi.  GI: Soft. Bowel sounds are normal. The abdomen is soft and nontender.  There is no rebound and no guarding.  Musculoskeletal: Normal range of motion. Extremities are nontender. R inguinal lyphadenopathy present Skin: Skin is warm and dry. No rash noted. No diaphoresis. No erythema. No pallor. No clubbing, cyanosis, or edema.   Psychiatric: Normal mood and affect. Behavior is normal. Judgment and thought content normal.   Procedure: Anoscopy Surgeon: Shalaya Swailes Assistant: Glaspey After the risks and benefits were explained, verbal consent was obtained for above procedure  Anesthesia: none Diagnosis: RVF, rectal bleeding Findings: large anterior anal mass, biopsy taken with forceps, hemostasis achieved with pressure.    ASSESSMENT AND PLAN: Sherry Bautista is a 57 y.o. F with COPD who has an anterior anal mass with rectovaginal fistula.  This is concerning for anal cancer.  We performed a biopsy in the office and will schedule her for colonoscopy ASAP.  I will address the treatment of this with her further once the biopsy results are back.  I strongly suspsect this is an anal cancer.  If the biopsy is positive, she will need a referral to oncology and radiation oncology, as well as imaging of her chest, abd and pelvis.    Tahnee Cifuentes C Izyan Ezzell, MD Colon and Rectal Surgery / General Surgery [facility]      Visit Diagnoses: 1. Anal lesion     Primary Care Physician: William Hopper, MD   

## 2012-05-31 NOTE — Op Note (Signed)
[facility] [address] [place], [address]   COLONOSCOPY PROCEDURE REPORT  PATIENT: Sherry Bautista, Sherry Bautista  MR#: [id number] BIRTHDATE: [birth year] , 56  yrs. old GENDER: Female ENDOSCOPIST: Vanita Panda, MD REFERRED [id number] Alwyn Ren, M.D. PROCEDURE DATE:  05/31/2012 PROCEDURE:   Colonoscopy, screening ASA CLASS:   Class III INDICATIONS:Average risk patient for colon cancer. MEDICATIONS: Versed 6 mg IV and Fentanyl 100 mcg IV  DESCRIPTION OF PROCEDURE:   After the risks benefits and alternatives of the procedure were thoroughly explained, informed consent was obtained.  A digital rectal exam revealed a palpable rectal mass.   The     endoscope was introduced through the anus and advanced to the cecum, which was identified by both the appendix and ileocecal valve. No adverse events experienced.   The quality of the prep was adequate, using MiraLax  The instrument was then slowly withdrawn as the colon was fully examined.    Findings: Distal rectal mass.  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   COLON FINDINGS: Mild diverticulosis was noted in the sigmoid colon. A few small polypoid shaped flat polyps were found in the rectum. A polypectomy was performed with cold forceps.  The resection was complete and the polyp tissue was completely retrieved.   Measuring 5cm in length, non-circumferential and non-obstructing malignant appearing tumor/mass was seen in rectum seen upon the retroflexed view.  Retroflexed views revealed no abnormalities.  Withdrawal time was 20 minutes.  The scope was withdrawn and the procedure completed.  ENDOSCOPIC IMPRESSION:     1.   Mild diverticulosis was noted in the sigmoid colon 2.   Few small flat polyps were found in the rectum; polypectomy was performed with cold forceps 3.   Malignant appearing tumor/mass in rectum seen upon the retroflexed view   RECOMMENDATIONS:     1.  Call  to schedule a follow-up appointment with Routine colonoscopy for screening in 10 years 2.  High fiber diet 3.   Follow up in 5 years if polyp pathology is adenomatous   eSigned:  Vanita Panda, MD 05/31/2012 9:07 AM   cc: Janace Hoard, MD

## 2012-06-01 ENCOUNTER — Encounter (HOSPITAL_COMMUNITY): Payer: Self-pay | Admitting: General Surgery

## 2012-06-02 ENCOUNTER — Encounter: Payer: Self-pay | Admitting: *Deleted

## 2012-06-02 DIAGNOSIS — C801 Malignant (primary) neoplasm, unspecified: Secondary | ICD-10-CM | POA: Insufficient documentation

## 2012-06-02 NOTE — Progress Notes (Signed)
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   Pacemaker/ICD? no  Possible current pregnancy?   Is the patient on methotrexate? no  Current Complaints / other details:  Divorced, works at [employer]

## 2012-06-03 ENCOUNTER — Ambulatory Visit
Admission: RE | Admit: 2012-06-03 | Discharge: 2012-06-03 | Disposition: A | Discharge status: Home / Self Care | Payer: BC Managed Care – PPO | Source: Ambulatory Visit | Attending: Radiation Oncology | Admitting: Radiation Oncology

## 2012-06-03 ENCOUNTER — Encounter: Payer: Self-pay | Admitting: Radiation Oncology

## 2012-06-03 VITALS — BP 122/63 | HR 91 | Temp 98.0°F | Resp 20 | Wt 116.1 lb

## 2012-06-03 DIAGNOSIS — J449 Chronic obstructive pulmonary disease, unspecified: Secondary | ICD-10-CM | POA: Insufficient documentation

## 2012-06-03 DIAGNOSIS — J4489 Other specified chronic obstructive pulmonary disease: Secondary | ICD-10-CM | POA: Insufficient documentation

## 2012-06-03 DIAGNOSIS — C801 Malignant (primary) neoplasm, unspecified: Secondary | ICD-10-CM

## 2012-06-03 DIAGNOSIS — Z79899 Other long term (current) drug therapy: Secondary | ICD-10-CM | POA: Insufficient documentation

## 2012-06-03 DIAGNOSIS — C210 Malignant neoplasm of anus, unspecified: Secondary | ICD-10-CM

## 2012-06-03 DIAGNOSIS — N824 Other female intestinal-genital tract fistulae: Secondary | ICD-10-CM | POA: Insufficient documentation

## 2012-06-03 DIAGNOSIS — C211 Malignant neoplasm of anal canal: Secondary | ICD-10-CM | POA: Insufficient documentation

## 2012-06-03 NOTE — Progress Notes (Addendum)
Pt c/o lower abdomen, rectal pain, cramping 6-7/10. She is taking Advil 400 mg approximately 3 x daily w/fair relief. Advil decreases pain to 3-4/10. She states it is painful to sit directly on buttocks; she shifts her weight from hip to hip, or lies down for most comfort. She states she is having rectal/vaginal bleeding from fistula, denies constipation and diarrhea, denies urinary issues, fatigue, loss of appetite. Pt states she sometimes has to strain to have BM.  Pt lives alone, no children, not working at this time. Sister w/pt today.

## 2012-06-03 NOTE — Progress Notes (Signed)
Please see the Nurse Progress Note in the MD Initial Consult Encounter for this patient. 

## 2012-06-04 ENCOUNTER — Encounter: Payer: Self-pay | Admitting: Oncology

## 2012-06-07 ENCOUNTER — Ambulatory Visit (HOSPITAL_BASED_OUTPATIENT_CLINIC_OR_DEPARTMENT_OTHER): Payer: BC Managed Care – PPO | Admitting: Oncology

## 2012-06-07 ENCOUNTER — Telehealth (INDEPENDENT_AMBULATORY_CARE_PROVIDER_SITE_OTHER): Payer: Self-pay

## 2012-06-07 ENCOUNTER — Encounter: Payer: Self-pay | Admitting: Oncology

## 2012-06-07 ENCOUNTER — Telehealth (INDEPENDENT_AMBULATORY_CARE_PROVIDER_SITE_OTHER): Payer: Self-pay | Admitting: General Surgery

## 2012-06-07 ENCOUNTER — Encounter (INDEPENDENT_AMBULATORY_CARE_PROVIDER_SITE_OTHER): Payer: Self-pay | Admitting: General Surgery

## 2012-06-07 ENCOUNTER — Telehealth: Payer: Self-pay | Admitting: Oncology

## 2012-06-07 ENCOUNTER — Ambulatory Visit: Payer: BC Managed Care – PPO

## 2012-06-07 ENCOUNTER — Telehealth: Payer: Self-pay | Admitting: *Deleted

## 2012-06-07 VITALS — BP 156/73 | HR 117 | Temp 97.0°F | Resp 16 | Ht 64.0 in | Wt 114.9 lb

## 2012-06-07 DIAGNOSIS — N824 Other female intestinal-genital tract fistulae: Secondary | ICD-10-CM

## 2012-06-07 DIAGNOSIS — C210 Malignant neoplasm of anus, unspecified: Secondary | ICD-10-CM

## 2012-06-07 DIAGNOSIS — R52 Pain, unspecified: Secondary | ICD-10-CM

## 2012-06-07 DIAGNOSIS — F17200 Nicotine dependence, unspecified, uncomplicated: Secondary | ICD-10-CM

## 2012-06-07 MED ORDER — OXYCODONE HCL 5 MG PO TABS: 5.0000 mg | ORAL_TABLET | ORAL | Status: DC | PRN

## 2012-06-07 MED ORDER — DOCUSATE SODIUM 100 MG PO CAPS: 100.0000 mg | ORAL_CAPSULE | Freq: Two times a day (BID) | ORAL | Status: DC

## 2012-06-07 NOTE — Telephone Encounter (Signed)
The pt called to give me her new insurance information.  She does not have a card yet.  She has [employer] Blue Silver 2800.  Her Subscriber # is [id number].  The [facility] # is [id number].  The group # is [id number].  Their phone # is [phone].  We will get her scans rescheduled.  She has seen the radiation oncologist and sees the medical oncologist today.

## 2012-06-07 NOTE — Progress Notes (Signed)
Checked in new patient.

## 2012-06-07 NOTE — Progress Notes (Signed)
[facility] New Patient Consult   Referring ZO:XWRUEA Robert Sunga 57 y.o.  11-Aug-1955    Reason for Referral: anal cancer     HPI: She reports developing "cyst "on the labia that was treated with antibiotics. The area did not completely healed and she developed stool output from the vagina. She saw her gynecologistand was referred to Dr. Maisie Fus. On 05/11/2012 she was noted to have an anterior anal mass. A biopsy confirmed invasive squamous cell carcinoma, HPV positive. She was taken to a colonoscopy procedure on 05/31/2012. Diverticulosis was found in the sigmoid colon. A few small polypoid shaped flat polyps were noted in the rectum. These were resected and retrieved. A nonobstructing malignant-appearing mass was seen in the rectum upon retroflexion. The biopsy of the polyps revealed fragments of hyperplastic polyps.  She was referred to Dr. Mitzi Hansen to consider treatment options of the newly diagnosed anal cancer with a rectovaginal fistula. She complains of anal/vaginal pain and she continues to have stool output from the vagina.  Past Medical History  Diagnosis Date  . Tobacco use-ongoing   . Depression   . Anxiety   . Allergic rhinitis     allergy shots  w/o significant response  . Hyperglycemia   . COPD (chronic obstructive pulmonary disease)   . Cancer 05/11/12    Anal cancer  . Bartholin cyst 07/2011  . Recto-vaginal fistula   . Osteopenia     .  G1 P0, one miscarriage  Past Surgical History  Procedure Laterality Date  . Nasal sinus surgery    . Tonsillectomy    . Knee surgery  2003    cartlage repair  . Breast lumpectomy Left 15-20 yrs ago    benign cyst  . Rectal abscess removal    . Colonoscopy N/A 05/31/2012    Procedure: COLONOSCOPY;  Surgeon: Romie Levee, MD;  Location: [facility];  Service: Endoscopy;  Laterality: N/A;  . Cardiac catherization      no blockages    Family History  Problem Relation Age of Onset  . Lung cancer  Father   . COPD Father   . Hyperlipidemia Father   . Cancer Father   . Factor IX deficiency Mother     PTE  . Asthma Sister   . Hypertension Sister   . Heart attack Maternal Grandmother   . Heart attack Maternal Grandfather   . Heart attack Paternal Grandmother     Current outpatient prescriptions:albuterol (PROAIR HFA) 108 (90 BASE) MCG/ACT inhaler, Inhale 2 puffs into the lungs every 4 (four) hours as needed., Disp: 1 Inhaler, Rfl: 3;  ALPRAZolam (XANAX) 0.25 MG tablet, Take 1 tablet (0.25 mg total) by mouth daily as needed., Disp: 30 tablet, Rfl: 0;  AMBULATORY NON FORMULARY MEDICATION, Oxygen: 3 liters at rest and 4 liters with activity , Disp: , Rfl:  buPROPion (WELLBUTRIN SR) 150 MG 12 hr tablet, Take 1 tablet (150 mg total) by mouth 2 (two) times daily., Disp: 60 tablet, Rfl: 2;  DULERA 200-5 MCG/ACT AERO, USE 2 PUFFS EVERY 12 HOURS ; GARGLE & SPIT AFTER USE, Disp: 13 g, Rfl: 5;  ibuprofen (ADVIL,MOTRIN) 200 MG tablet, Take 200 mg by mouth every 6 (six) hours as needed for pain., Disp: , Rfl:  PARoxetine (PAXIL-CR) 25 MG 24 hr tablet, Take 1 tablet (25 mg total) by mouth 2 (two) times daily., Disp: 60 tablet, Rfl: 3;  SPIRIVA HANDIHALER 18 MCG inhalation capsule, PLACE 1 CAPSULE INTO INHALER AND INHALE DAILY, Disp:  1 each, Rfl: 5;  docusate sodium (COLACE) 100 MG capsule, Take 1 capsule (100 mg total) by mouth 2 (two) times daily., Disp: 10 capsule, Rfl: 0  Allergies:  Allergies  Allergen Reactions  . Azithromycin     rash  . Tetanus-Diphtheria Toxoids Td     Redness, swelling, stiffness of arm   . Codeine Sulfate     itching    Social History: she lives in [place], she is on medical disability. She smokes 1/2-1 pack of cigarettes per day. No alcohol use. No transfusion history. No risk factor for HIV or hepatitis.   ROS:   Positives include:anorexia since undergoing the colonoscopy on 05/31/2012, chronic exertional dyspnea, cough in the morning and afternoon this, bleeding  at the anus  A complete ROS was otherwise negative.  Physical Exam:  Blood pressure 156/73, pulse 117, temperature 97 F (36.1 C), temperature source Oral, resp. rate 16, height 5\' 4"  (1.626 m), weight 114 lb 14.4 oz (52.118 kg).  HEENT: oropharynx without visible mass, neck without mass Lungs: distant breath sounds with and inspiratory rhonchi bilaterally, no respiratory distress Cardiac: regular rate and rhythm Abdomen: no  Hepatosplenomegaly, nontender, no mass Rectal/vaginal exam-there is visible tumor at the left side of the anal verge with edema of the left labia. On manual examination there is tumor extending 4-5 cm proximal in the anal canal and extending into the vagina  Vascular: no leg edema Lymph nodes: no cervical, supraclavicular, or axillary nodes. 1 cm firm mobile medial right inguinal node Neurologic: alert and oriented, the motor exam appears intact in the upper and lower extremities Skin: no rash  Assessment/Plan:   1. Anal cancer, locally advanced, staging PET scan scheduled for 06/10/2012 2. 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 Pain secondary to the locally advanced anal cancer 4. COPD-oxygen dependent 5. Hyperplastic polyps on colonoscopy 05/31/2012   Disposition:   She has been diagnosed with locally advanced anal cancer. I discussed the diagnosis and treatment options with Ms. Mickelson and her sister.she is scheduled to undergo a staging PET scan on 06/10/2012. We discussed "standard" treatment with chemotherapy and radiation. She understands the rectovaginal fistula may not heal and she may need surgery to correct the fistula.  I discussed the case with Dr. Mitzi Hansen. He will proceed with radiation simulation later this week with the plan to begin concurrent therapy on 06/21/2012. She will attend a chemotherapy teaching class and we will obtain a baseline CBC/chemistry panel. Arrangements will be made for placement of a PICC prior to beginning chemotherapy.  I  recommend chemotherapy with the 5-fluorouracil/mitomycin C. Regimen. I reviewed the potential toxicities associated with this regimen including the chance for nausea/vomiting, alopecia, mucositis, diarrhea, and hematologic toxicity. We discussed the hemolytic uremic syndrome associated with mitomycin-C. We discussed the hand/foot syndrome and skin hyperpigmentation associated with 5-fluorouracil. We discussed the potential for skin breakdown at the perineum and labia with chemotherapy and radiation.  Her case will be presented at the GI tumor conference on 06/09/2012. Ms. Hinderliter agrees to proceed with treatment. She will return for an office visit and nadir CBC on 07/01/2012.  We strongly encouraged her to discontinue smoking. She understands smoking may potentially increase the risk of toxicity associated with chemotherapy/radiation.  Laylia Mui 06/07/2012, 9:25 PM

## 2012-06-07 NOTE — Progress Notes (Signed)
Radiation Oncology         ([phone]) [phone] ________________________________  Name: Sherry Bautista MRN: [id number]  Date: 06/03/2012  DOB: 03/11/1955  CC:Sherry S., NP  Sherry Levee, MD   Sherry Bautista, M.D.  REFERRING PHYSICIAN: Romie Levee, MD   DIAGNOSIS: The encounter diagnosis was Cancer.   HISTORY OF PRESENT ILLNESS::Sherry Bautista is a 57 y.o. female who is seen for an initial consultation visit. The patient was treated for a bartholin's cyst which cleared up with Keflex medication. She then complained of an additional vaginal cyst and was further worked up by [employer]. 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 The medical records mention multiple biopsies and I believe that the initial evaluation did not show clear malignancy. There is a mention of "pre-cancerous cells."  The patient was referred to Dr. Maisie Bautista for biopsy the patient on 05/11/2012. 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 Stains have indicated that the tumor is strongly positive for p16. The patient underwent a colonoscopy on 05/31/2012. This showed a non-circumferential, non-obstructing malignant appearing tumor which is present in the distal rectum measuring 5 cm in length. A few small polyps were also found in the rectum and these were benign on final pathology.  The patient at this time describes some occasional pelvic pain. She indicates that she takes Advil for this and this does work well currently she states. She notes fasting of 8 or through the vagina and fecal material has been seen within the vagina on exam. The patient states that she has had just a little bit of bleeding with bowel movements and she does need to strain greater than she typically has in the past. She has also been on supplemental oxygen for 3 years.   PREVIOUS RADIATION THERAPY: No   PAST MEDICAL HISTORY:  has a past medical history of Tobacco  use disorder; Depression; Anxiety; Allergic rhinitis; Hyperglycemia; COPD (chronic obstructive pulmonary disease); Cancer (05/11/12); Bartholin cyst (07/2011); Recto-vaginal fistula; and Osteopenia.     PAST SURGICAL HISTORY: Past Surgical History  Procedure Laterality Date  . Nasal sinus surgery    . Tonsillectomy    . Knee surgery  2003    cartlage repair  . Breast lumpectomy Left 15-20 yrs ago    benign cyst  . Rectal abscess removal    . Colonoscopy N/A 05/31/2012    Procedure: COLONOSCOPY;  Surgeon: Sherry Levee, MD;  Location: [facility];  Service: Endoscopy;  Laterality: N/A;  . Cardiac catherization      no blockages     FAMILY HISTORY: family history includes Asthma in her sister; COPD in her father; Cancer in her father; Factor IX deficiency in her mother; Heart attack in her maternal grandfather, maternal grandmother, and paternal grandmother; Hyperlipidemia in her father; Hypertension in her sister; and Lung cancer in her father.   SOCIAL HISTORY:  reports that she has been smoking Cigarettes.  She has a 30 pack-year smoking history. She has never used smokeless tobacco. She reports that she does not drink alcohol or use illicit drugs.   ALLERGIES: Azithromycin; Tetanus-diphtheria toxoids td; and Codeine sulfate   MEDICATIONS:  Current Outpatient Prescriptions  Medication Sig Dispense Refill  . albuterol (PROAIR HFA) 108 (90 BASE) MCG/ACT inhaler Inhale 2 puffs into the lungs every 4 (four) hours as needed.  1 Inhaler  3  . ALPRAZolam (XANAX) 0.25 MG tablet Take 1 tablet (0.25 mg total) by mouth daily as  needed.  30 tablet  0  . AMBULATORY NON FORMULARY MEDICATION Oxygen: 3 liters at rest and 4 liters with activity       . buPROPion (WELLBUTRIN SR) 150 MG 12 hr tablet Take 1 tablet (150 mg total) by mouth 2 (two) times daily.  60 tablet  2  . DULERA 200-5 MCG/ACT AERO USE 2 PUFFS EVERY 12 HOURS ; GARGLE & SPIT AFTER USE  13 g  5  . ibuprofen (ADVIL,MOTRIN) 200 MG  tablet Take 200 mg by mouth every 6 (six) hours as needed for pain.      . mometasone-formoterol (DULERA) 200-5 MCG/ACT AERO Inhale 2 puffs into the lungs 2 (two) times daily.  2 Inhaler  0  . PARoxetine (PAXIL-CR) 25 MG 24 hr tablet Take 1 tablet (25 mg total) by mouth 2 (two) times daily.  60 tablet  3  . SPIRIVA HANDIHALER 18 MCG inhalation capsule PLACE 1 CAPSULE INTO INHALER AND INHALE DAILY  1 each  5  . tiotropium (SPIRIVA HANDIHALER) 18 MCG inhalation capsule Place 1 capsule (18 mcg total) into inhaler and inhale daily.  20 capsule  0   No current facility-administered medications for this encounter.     REVIEW OF SYSTEMS:  A 15 point review of systems is documented in the electronic medical record. This was obtained by the nursing staff. However, I reviewed this with the patient to discuss relevant findings and make appropriate changes.  Pertinent items are noted in HPI.    PHYSICAL EXAM:  weight is 116 lb 1.6 oz (52.663 kg). Her oral temperature is 98 F (36.7 C). Her blood pressure is 122/63 and her pulse is 91. Her respiration is 20 and oxygen saturation is 91%.   General: Well-developed, in no acute distress HEENT: Normocephalic, atraumatic; oral cavity clear Neck: Supple without any lymphadenopathy Cardiovascular: Regular rate and rhythm Respiratory: Clear to auscultation bilaterally GI: Soft, nontender, normal bowel sounds Extremities: No edema present Neuro: No focal deficits Lymph:  Right-sided inguinal lymphadenopathy present, mobile Rectal/ Gyn exam:   Firm mass present within the distal rectum/anal canal anteriorly. Ulceration through to the posterior aspect of the vaginal canal present with some fecal material present in the vaginal canal. No other lesions seen within the vaginal canal. A draining cyst is present infero-laterally to the vaginal opening on the left.    LABORATORY DATA:  Lab Results  Component Value Date   WBC 7.9 07/02/2011   HGB 15.8* 07/02/2011    HCT 48.2* 07/02/2011   MCV 100.4* 07/02/2011   PLT 196 07/02/2011   Lab Results  Component Value Date   NA 136 07/02/2011   K 4.3 07/02/2011   CL 98 07/02/2011   CO2 30 07/02/2011   Lab Results  Component Value Date   ALT 18 09/26/2010   AST 17 09/26/2010   ALKPHOS 77 09/26/2010   BILITOT 1.3* 09/26/2010      RADIOGRAPHY: No results found.     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 This I believe represents a challenging case. In general, I would recommend chemoradiotherapy for this patient and this would afford her the possible opportunity for sphincter preservation. I recommend that the patient proceed with a PET scan for further staging and this will help to clarify further the potential extent of local/regional disease.  With the fistula present, I discussed the difficulties with this in terms of a persistent or  worsening fistula after radiotherapy. The down sides of such treatment must be balanced versus the down sides of other alternatives and without definitive radiotherapy the patient will require an APR. If we proceed with radiotherapy, she may require additional reconstructive surgery depending on how she heals.  One option therefore is to proceed with a standard course of chemoradiotherapy assuming no major additional findings are found on further workup. A literature search suggests that this has been tried with some success. Alternatively, the patient could be treated with induction chemotherapy and this has been tried to attempt to close the fistula prior to standard chemoradiotherapy. The thought being that this may afford a greater opportunity for healing and avoid potential reconstructive surgery later. This is not a common situation and there is not a clear standard of care in my opinion. I discussed both options with the patient. She is scheduled to see Dr. Truett Perna next week.   PLAN:  I  therefore discussed with the patient a potential course of 5-1/2- to 6 weeks of radiotherapy. We discussed the rationale of this treatment in the setting of squamous cell carcinoma of the anal canal, and the radiosensitivity of this tumor typically to such a treatment. We discussed the role of concurrent chemotherapy. We discussed the potentials side effects and risks of treatment, again especially noting the difficulties in the setting of a pre-existing fistula. All of her questions were answered.  We will await her visit with medical oncology next week and then proceed with further planning.    I spent 60 minutes minutes face to face with the patient and more than 50% of that time was spent in counseling and/or coordination of care.    ________________________________   Radene Gunning, MD, PhD

## 2012-06-07 NOTE — Telephone Encounter (Signed)
Left message for patient to call office she has appt for pet  06/10/12 at [address]  As well and ct abd pelvis and ct chest

## 2012-06-07 NOTE — Progress Notes (Signed)
Met with patient and explained role of nurse navigator.  Educational information was provided.  Resources at [facility] reviewed and given to patient along with phone numbers and contact names.  Referral made to dietician.  Patient currently denies barriers such as transportation, financial, or communication.  She is currently a smoker and was instructed per Dr. Truett Perna and this RN to quit.  Smoking cessation information was given to the patient.  She verbalized comprehension and was without questions.  Will continue to follow as needed.

## 2012-06-07 NOTE — Telephone Encounter (Signed)
Per staff phone call and POF I have schedueld appts.  JMW  

## 2012-06-08 ENCOUNTER — Telehealth (INDEPENDENT_AMBULATORY_CARE_PROVIDER_SITE_OTHER): Payer: Self-pay | Admitting: General Surgery

## 2012-06-08 NOTE — Telephone Encounter (Signed)
Left message for patient she has appt 06/10/12 at 12 noon arriving 11:45  And no food 6hrs prior

## 2012-06-09 ENCOUNTER — Other Ambulatory Visit (INDEPENDENT_AMBULATORY_CARE_PROVIDER_SITE_OTHER): Payer: Self-pay

## 2012-06-09 ENCOUNTER — Telehealth (INDEPENDENT_AMBULATORY_CARE_PROVIDER_SITE_OTHER): Payer: Self-pay | Admitting: General Surgery

## 2012-06-09 DIAGNOSIS — IMO0002 Reserved for concepts with insufficient information to code with codable children: Secondary | ICD-10-CM

## 2012-06-09 NOTE — Telephone Encounter (Signed)
Left message that patient has appt at [facility]YN [facility] on 06/15/12 1pm

## 2012-06-10 ENCOUNTER — Telehealth: Payer: Self-pay | Admitting: *Deleted

## 2012-06-10 ENCOUNTER — Encounter (HOSPITAL_COMMUNITY): Payer: BC Managed Care – PPO

## 2012-06-10 ENCOUNTER — Ambulatory Visit (HOSPITAL_COMMUNITY): Payer: BC Managed Care – PPO

## 2012-06-10 ENCOUNTER — Other Ambulatory Visit: Payer: BC Managed Care – PPO

## 2012-06-10 NOTE — Telephone Encounter (Signed)
Call from pt reporting her GYN referred her to the GYN-oncologist and she wasn't expecting this. Would like to know if Dr. Truett Perna thinks this is necessary. Per Dr. Truett Perna: Yes, he agrees she should see Dr. Nelly Rout before beginning treatment. Message forwarded to GI navigator to discuss with pt.

## 2012-06-10 NOTE — Telephone Encounter (Signed)
Attempted to reach patient by phone without success.  Left voice message for patient to return call to this RN.

## 2012-06-10 NOTE — Telephone Encounter (Signed)
No additional note

## 2012-06-11 ENCOUNTER — Telehealth: Payer: Self-pay | Admitting: *Deleted

## 2012-06-11 ENCOUNTER — Telehealth: Payer: Self-pay | Admitting: Oncology

## 2012-06-11 ENCOUNTER — Other Ambulatory Visit: Payer: Self-pay | Admitting: *Deleted

## 2012-06-11 NOTE — Telephone Encounter (Signed)
S/w pt re appts for 4/29, 5/5. 5/9, and 5/15. Pt to get schedule 4/29.

## 2012-06-11 NOTE — Telephone Encounter (Signed)
Per patient voice mail, I have called and left her a voice mail. The voice mail contained the appt for Tuesday.  JMW

## 2012-06-11 NOTE — Telephone Encounter (Signed)
Spoke with patient by phone and explained reasons for referral to GYN oncology.  Patient appreciated the phone call.  Patient also requested to be re-scheduled for chemo class as she missed appointment on 06/10/12.  POF sent to scheduler.

## 2012-06-14 ENCOUNTER — Telehealth: Payer: Self-pay | Admitting: Gynecologic Oncology

## 2012-06-14 NOTE — Telephone Encounter (Signed)
Consult Note: Gyn-Onc  Consult was requested by Dr. Alcide Evener  for the evaluation of Sherry Bautista 57 y.o. female  CC:  Rectal cancer, rectovaginal fistula   Assessment/Plan:  [name]  is a 57 y.o.  year old **  HPI: **  Plan is  for chemo radiation with 5FU mitomycin C.    Current Meds:  Outpatient Encounter Prescriptions as of 06/14/2012  Medication Sig Dispense Refill  . albuterol (PROAIR HFA) 108 (90 BASE) MCG/ACT inhaler Inhale 2 puffs into the lungs every 4 (four) hours as needed.  1 Inhaler  3  . ALPRAZolam (XANAX) 0.25 MG tablet Take 1 tablet (0.25 mg total) by mouth daily as needed.  30 tablet  0  . AMBULATORY NON FORMULARY MEDICATION Oxygen: 3 liters at rest and 4 liters with activity       . buPROPion (WELLBUTRIN SR) 150 MG 12 hr tablet Take 1 tablet (150 mg total) by mouth 2 (two) times daily.  60 tablet  2  . docusate sodium (COLACE) 100 MG capsule Take 1 capsule (100 mg total) by mouth 2 (two) times daily.  10 capsule  0  . DULERA 200-5 MCG/ACT AERO USE 2 PUFFS EVERY 12 HOURS ; GARGLE & SPIT AFTER USE  13 g  5  . ibuprofen (ADVIL,MOTRIN) 200 MG tablet Take 200 mg by mouth every 6 (six) hours as needed for pain.      Marland Kitchen PARoxetine (PAXIL-CR) 25 MG 24 hr tablet Take 1 tablet (25 mg total) by mouth 2 (two) times daily.  60 tablet  3  . SPIRIVA HANDIHALER 18 MCG inhalation capsule PLACE 1 CAPSULE INTO INHALER AND INHALE DAILY  1 each  5   No facility-administered encounter medications on file as of 06/14/2012.    Allergy:  Allergies  Allergen Reactions  . Azithromycin     rash  . Tetanus-Diphtheria Toxoids Td     Redness, swelling, stiffness of arm   . Codeine Sulfate     itching    Social Hx:   History   Social History  . Marital Status: Divorced    Spouse Name: N/A    Number of Children: 0  . Years of Education: N/A   Occupational History  . [employer]    Social History Main Topics  . Smoking status: Current Every Day Smoker -- 1.00  packs/day for 30 years    Types: Cigarettes  . Smokeless tobacco: Never Used     Comment: started smoking at age 35.  Currently smoking 1ppd  . Alcohol Use: No     Comment: very rarely  . Drug Use: No     Comment: marijuana yrs ago  . Sexually Active: Not on file     Comment: menarche age 65   Other Topics Concern  . Not on file   Social History Narrative   Divorced-lives alone   Has #2 dogs and #2 cats   No children   Formerly worked at [employer]   Continuous oxygen 3-4 l/min [place] per [facility]    Past Surgical Hx:  Past Surgical History  Procedure Laterality Date  . Nasal sinus surgery    . Tonsillectomy    . Knee surgery  2003    cartlage repair  . Breast lumpectomy Left 15-20 yrs ago    benign cyst  . Rectal abscess removal    . Colonoscopy N/A 05/31/2012    Procedure: COLONOSCOPY;  Surgeon: Romie Levee, MD;  Location: [facility];  Service: Endoscopy;  Laterality: N/A;  . Cardiac catherization      no blockages    Past Medical Hx:  Past Medical History  Diagnosis Date  . Tobacco use disorder   . Depression   . Anxiety   . Allergic rhinitis     allergy shots  w/o significant response  . Hyperglycemia   . COPD (chronic obstructive pulmonary disease)   . Cancer 05/11/12    rectal cancer  . Bartholin cyst 07/2011  . Recto-vaginal fistula   . Osteopenia     Past Gynecological History:  ** No LMP recorded. Patient is postmenopausal.  Family Hx:  Family History  Problem Relation Age of Onset  . Lung cancer Father   . COPD Father   . Hyperlipidemia Father   . Cancer Father   . Factor IX deficiency Mother     PTE  . Asthma Sister   . Hypertension Sister   . Heart attack Maternal Grandmother   . Heart attack Maternal Grandfather   . Heart attack Paternal Grandmother     Review of Systems:  Constitutional  Feels well,  ** Skin/Breast  No rash, sores, jaundice, itching, dryness Cardiovascular  No chest pain, shortness of breath, or  edema  Pulmonary  No cough or wheeze.  Gastro Intestinal  No nausea, vomitting, or diarrhoea. No bright red blood per rectum, no abdominal pain, change in bowel movement, or constipation.  Genito Urinary  No frequency, urgency, dysuria, ** Musculo Skeletal  No myalgia, arthralgia, joint swelling or pain  Neurologic  No weakness, numbness, change in gait,  Psychology  No depression, anxiety, insomnia.   Vitals:  @v @  Physical Exam: WD in NAD Neck  Supple NROM, without any enlargements.  Lymph Node Survey No cervical supraclavicular or inguinal adenopathy Cardiovascular  Pulse normal rate, regularity and rhythm. S1 and S2 normal.  Lungs  Clear to auscultation bilateraly, without wheezes/crackles/rhonchi. Good air movement.  Skin  No rash/lesions/breakdown  Psychiatry  Alert and oriented to person, place, and time  Abdomen  Normoactive bowel sounds, abdomen soft, non-tender and obese. Surgical  sites intact without evidence of hernia.  Back No CVA tenderness Genito Urinary  Vulva/vagina: Normal external female genitalia.  No lesions. No discharge or bleeding.  Bladder/urethra:  No lesions or masses  Vagina: **  Cervix: Normal appearing, no lesions.  Uterus: Small, mobile, no parametrial involvement or nodularity.  Adnexa: No palpable masses. Rectal  Good tone, no masses no cul de sac nodularity.  Extremities  No bilateral cyanosis, clubbing or edema.    Laurette Schimke, MD, PhD 06/14/2012, 9:34 PM

## 2012-06-15 ENCOUNTER — Encounter: Payer: Self-pay | Admitting: Gynecologic Oncology

## 2012-06-15 ENCOUNTER — Ambulatory Visit (HOSPITAL_BASED_OUTPATIENT_CLINIC_OR_DEPARTMENT_OTHER): Payer: BC Managed Care – PPO | Admitting: Lab

## 2012-06-15 ENCOUNTER — Other Ambulatory Visit: Payer: BC Managed Care – PPO

## 2012-06-15 ENCOUNTER — Other Ambulatory Visit: Payer: Self-pay | Admitting: *Deleted

## 2012-06-15 ENCOUNTER — Ambulatory Visit: Payer: BC Managed Care – PPO | Attending: Gynecologic Oncology | Admitting: Gynecologic Oncology

## 2012-06-15 ENCOUNTER — Encounter: Payer: Self-pay | Admitting: *Deleted

## 2012-06-15 VITALS — BP 110/60 | Temp 97.7°F | Resp 18 | Ht 64.57 in | Wt 113.4 lb

## 2012-06-15 DIAGNOSIS — R599 Enlarged lymph nodes, unspecified: Secondary | ICD-10-CM | POA: Insufficient documentation

## 2012-06-15 DIAGNOSIS — C210 Malignant neoplasm of anus, unspecified: Secondary | ICD-10-CM

## 2012-06-15 DIAGNOSIS — C20 Malignant neoplasm of rectum: Secondary | ICD-10-CM | POA: Insufficient documentation

## 2012-06-15 DIAGNOSIS — C519 Malignant neoplasm of vulva, unspecified: Secondary | ICD-10-CM

## 2012-06-15 DIAGNOSIS — N9089 Other specified noninflammatory disorders of vulva and perineum: Secondary | ICD-10-CM | POA: Insufficient documentation

## 2012-06-15 DIAGNOSIS — A630 Anogenital (venereal) warts: Secondary | ICD-10-CM | POA: Insufficient documentation

## 2012-06-15 DIAGNOSIS — N823 Fistula of vagina to large intestine: Secondary | ICD-10-CM

## 2012-06-15 DIAGNOSIS — Z9981 Dependence on supplemental oxygen: Secondary | ICD-10-CM | POA: Insufficient documentation

## 2012-06-15 LAB — CBC WITH DIFFERENTIAL/PLATELET
Basophils Absolute: 0.1 10*3/uL (ref 0.0–0.1)
EOS%: 5.8 % (ref 0.0–7.0)
Eosinophils Absolute: 0.9 10*3/uL — ABNORMAL HIGH (ref 0.0–0.5)
LYMPH%: 7.5 % — ABNORMAL LOW (ref 14.0–49.7)
MCH: 31.4 pg (ref 25.1–34.0)
MCV: 97 fL (ref 79.5–101.0)
MONO%: 6.8 % (ref 0.0–14.0)
NEUT#: 12.7 10*3/uL — ABNORMAL HIGH (ref 1.5–6.5)
Platelets: 314 10*3/uL (ref 145–400)
RBC: 4.2 10*6/uL (ref 3.70–5.45)

## 2012-06-15 LAB — COMPREHENSIVE METABOLIC PANEL (CC13)
Alkaline Phosphatase: 112 U/L (ref 40–150)
CO2: 35 mEq/L — ABNORMAL HIGH (ref 22–29)
Creatinine: 0.7 mg/dL (ref 0.6–1.1)
Glucose: 110 mg/dl — ABNORMAL HIGH (ref 70–99)
Total Bilirubin: 0.42 mg/dL (ref 0.20–1.20)

## 2012-06-15 MED ORDER — PROCHLORPERAZINE MALEATE 10 MG PO TABS: 10.0000 mg | ORAL_TABLET | Freq: Four times a day (QID) | ORAL | Status: DC | PRN

## 2012-06-15 NOTE — Progress Notes (Signed)
Consult Note: Gyn-Onc  Consult was requested by Dr. Alcide Evener  for the evaluation of Sherry Bautista 57 y.o. female  CC:  Vulvar/Rectal cancer  Assessment/Plan:  [name]  is a 57 y.o.  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  [name] has significnat pulmonary compromise.  Findings are suspicious for a vulvar cancer with extension into the vagina and rectum. It's very difficult to determine whether or not this is a primary anal versus vulvar cancer. My assessment is that this represents a vulvar malignancy with involvement of the bilateral inferior vulva and distal vagina and rectovaginal septum and anal invasion.  This patient's noted to have right inguinal adenopathy. In vulvar cancer the strategy to dissect the inguinal nodes prior to administration of chemoradiation. However this patient is so oxygen-dependent it's unlikely that she could be cleared for surgery within a short period of time.  I personally discuss this scenario with Dr. Alcide Evener and the plan is for treatment with chemoradiation with use of cis-platinum as a sensitizing agent.   HPI: This is a 57 year old gravida 1 para 0 who gives a history of recurrent labial cyst for several months treated with Keflex. Patient states that biopsies were obtained but no malignancy was identified. She presented with fecal discharge from the vagina on 05/11/2012 she underwent colonoscopy was noted to have an anterior anal mass. 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  Other findings of colonoscopy were that of diverticulosis and a few small polypoid flat-shaped polyps. The patient reports fecal discharge from the vagina and discomfort in the vulva area overall. A PET scan at this time has been scheduled for 06/16/2012. Given the presumptive diagnosis of rectal cancer she schedule for chemotherapy with mitomycin C. and 5-FU on 06/21/2012. She's also set for radiation oncology  simulation on June 16 2012.   Current Meds:  Outpatient Encounter Prescriptions as of 06/15/2012  Medication Sig Dispense Refill  . albuterol (PROAIR HFA) 108 (90 BASE) MCG/ACT inhaler Inhale 2 puffs into the lungs every 4 (four) hours as needed.  1 Inhaler  3  . ALPRAZolam (XANAX) 0.25 MG tablet Take 1 tablet (0.25 mg total) by mouth daily as needed.  30 tablet  0  . AMBULATORY NON FORMULARY MEDICATION Oxygen: 3 liters at rest and 4 liters with activity       . buPROPion (WELLBUTRIN SR) 150 MG 12 hr tablet Take 1 tablet (150 mg total) by mouth 2 (two) times daily.  60 tablet  2  . docusate sodium (COLACE) 100 MG capsule Take 1 capsule (100 mg total) by mouth 2 (two) times daily.  10 capsule  0  . DULERA 200-5 MCG/ACT AERO USE 2 PUFFS EVERY 12 HOURS ; GARGLE & SPIT AFTER USE  13 g  5  . ibuprofen (ADVIL,MOTRIN) 200 MG tablet Take 200 mg by mouth every 6 (six) hours as needed for pain.      Marland Kitchen PARoxetine (PAXIL-CR) 25 MG 24 hr tablet Take 1 tablet (25 mg total) by mouth 2 (two) times daily.  60 tablet  3  . SPIRIVA HANDIHALER 18 MCG inhalation capsule PLACE 1 CAPSULE INTO INHALER AND INHALE DAILY  1 each  5   No facility-administered encounter medications on file as of 06/15/2012.    Allergy:  Allergies  Allergen Reactions  . Azithromycin     rash  . Tetanus-Diphtheria Toxoids Td     Redness, swelling, stiffness of arm   .  Codeine Sulfate     itching    Social Hx:   History   Social History  . Marital Status: Divorced    Spouse Name: N/A    Number of Children: 0  . Years of Education: N/A   Occupational History  . [employer]    Social History Main Topics  . Smoking status: Current Every Day Smoker -- 1.00 packs/day for 30 years    Types: Cigarettes  . Smokeless tobacco: Never Used     Comment: started smoking at age 65.  Currently smoking 1ppd, smoking cessation info given  . Alcohol Use: No     Comment: very rarely  . Drug Use: No     Comment: marijuana yrs ago  .  Sexually Active: No     Comment: menarche age 25   Other Topics Concern  . Not on file   Social History Narrative   Divorced-lives alone   Has #2 dogs and #2 cats   No children   Formerly worked at [employer]   Continuous oxygen 3-4 l/min [place] per [facility]    Past Surgical Hx:  Past Surgical History  Procedure Laterality Date  . Nasal sinus surgery    . Tonsillectomy    . Knee surgery  2003    cartlage repair  . Breast lumpectomy Left 15-20 yrs ago    benign cyst  . Rectal abscess removal    . Colonoscopy N/A 05/31/2012    Procedure: COLONOSCOPY;  Surgeon: Romie Levee, MD;  Location: [facility];  Service: Endoscopy;  Laterality: N/A;  . Cardiac catherization      no blockages    Past Medical Hx:  Past Medical History  Diagnosis Date  . Tobacco use disorder   . Depression   . Anxiety   . Allergic rhinitis     allergy shots  w/o significant response  . Hyperglycemia   . COPD (chronic obstructive pulmonary disease)   . Cancer 05/11/12    rectal cancer  . Bartholin cyst 07/2011  . Recto-vaginal fistula   . Osteopenia     Past Gynecological History:  Gravida 1 para 0 menarche at age 8 with regular menses until 2-3 years ago history of cervical dysplasia treated with cryotherapy. Patient states that recent Pap tests have been within normal limits No LMP recorded. Patient is postmenopausal.  Family Hx:  Family History  Problem Relation Age of Onset  . Lung cancer Father   . COPD Father   . Hyperlipidemia Father   . Cancer Father   . Factor IX deficiency Mother     PTE  . Asthma Sister   . Hypertension Sister   . Heart attack Maternal Grandmother   . Heart attack Maternal Grandfather   . Heart attack Paternal Grandmother     Review of Systems:  Constitutional  Feels well Cardiovascular  No chest pain, shortness of breath, or edema  Pulmonary  No cough or wheeze.  Gastro Intestinal  No nausea, vomitting, or diarrhoea. No abdominal pain,   Genito Urinary  No frequency, urgency, dysuria, reports vulvar discomfort for several years  Musculo Skeletal  No myalgia, arthralgia, joint swelling or pain  Neurologic  No weakness, numbness, change in gait,  Psychology  No depression, anxiety, insomnia.   Vitals: BP 110/60  Temp(Src) 97.7 F (36.5 C) (Oral)  Resp 18  Ht 5' 4.57" (1.64 m)  Wt 113 lb 6.4 oz (51.438 kg)  BMI 19.12 kg/m2   Physical Exam: WD in  NAD, thin cachectic with nasal oxygen cannula Neck  Supple NROM, without any enlargements.  Lymph Node Survey No cervical supraclavicular adenopathy.  A right 2 cm inguinal adenopathy Cardiovascular  Pulse normal rate, regularity and rhythm. S1 and S2 normal.  Lungs  Clear to auscultation bilateraly, distant breath sounds bilaterally involving the lower lobes  Psychiatry  Alert and oriented to person, place, and time  Abdomen  Normoactive bowel sounds, abdomen soft, non-tender. Back No CVA tenderness Genito Urinary  Vulva/vagina: Involvement of bilateral lobar vulva with malignancy predominantly left-sided replacing the Bartholin's gland over a single midline and extending into the vagina.      Cervix: Palpably normal    Uterus: Unable to assess  Rectal  Fair  tone, rectal mass appreciated extending approximately 3 cm of the rectovaginal septum. Condyloma appreciated at the anus  Extremities  No bilateral cyanosis, clubbing or edema.    Laurette Schimke, MD, PhD 06/15/2012, 3:30 PM

## 2012-06-16 ENCOUNTER — Telehealth: Payer: Self-pay

## 2012-06-16 ENCOUNTER — Encounter (HOSPITAL_COMMUNITY): Payer: Self-pay

## 2012-06-16 ENCOUNTER — Ambulatory Visit
Admission: RE | Admit: 2012-06-16 | Discharge: 2012-06-16 | Disposition: A | Discharge status: Home / Self Care | Payer: BC Managed Care – PPO | Source: Ambulatory Visit | Attending: Radiation Oncology | Admitting: Radiation Oncology

## 2012-06-16 ENCOUNTER — Encounter (HOSPITAL_COMMUNITY)
Admission: RE | Admit: 2012-06-16 | Discharge: 2012-06-16 | Disposition: A | Discharge status: Home / Self Care | Payer: BC Managed Care – PPO | Source: Ambulatory Visit | Attending: General Surgery | Admitting: General Surgery

## 2012-06-16 ENCOUNTER — Telehealth: Payer: Self-pay | Admitting: *Deleted

## 2012-06-16 DIAGNOSIS — Z79899 Other long term (current) drug therapy: Secondary | ICD-10-CM | POA: Insufficient documentation

## 2012-06-16 DIAGNOSIS — C210 Malignant neoplasm of anus, unspecified: Secondary | ICD-10-CM | POA: Insufficient documentation

## 2012-06-16 DIAGNOSIS — Z510 Encounter for antineoplastic radiation therapy: Secondary | ICD-10-CM | POA: Insufficient documentation

## 2012-06-16 LAB — GLUCOSE, CAPILLARY: Glucose-Capillary: 100 mg/dL — ABNORMAL HIGH (ref 70–99)

## 2012-06-16 MED ORDER — FLUDEOXYGLUCOSE F - 18 (FDG) INJECTION
18.5000 | Freq: Once | INTRAVENOUS | Status: AC | PRN
  Administered 2012-06-16: 18.5 via INTRAVENOUS

## 2012-06-16 NOTE — Telephone Encounter (Signed)
Patient and her sister here for RT and scans.  Sister is Sherry Bautista's mode of transportation and needs help in order to get a substitute teacher to bring her here.  Walk in form with questions about does she need a P.I.C.C. Line on Monday?  How long will the treatments last?  For how many weeks will she be treated? 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  Have nausea meds been called in?  After notifying Dr. Truett Perna learned treatment plan is being changed to low dose cisplatin instead of 5FU/Mitomycin.  Will last for five weeks and will begin the day she starts RT.  Compazine was called in to [facility] in [place].  Patient does not need a P.I.C.C. Line for this treatment, appears to have good veins.    Sherry Bautista with this information.  Reports they learned this morning that Sherry Bautista is to begin RT on 06-28-2012 at 2:15 pm.  Dr. Truett Perna will change Cisplatin to start this day.  Informed her t o expect to be here all day once a week for cisplatin as it takes at least 6 hrs.  Will cancel P.I.C.C. placement.

## 2012-06-16 NOTE — Telephone Encounter (Signed)
Voice mail left with Dr.Sherrill's nurse that patient will not begin radiation until 06/28/2012.

## 2012-06-21 ENCOUNTER — Other Ambulatory Visit: Payer: Self-pay | Admitting: Internal Medicine

## 2012-06-21 ENCOUNTER — Ambulatory Visit: Payer: BC Managed Care – PPO

## 2012-06-21 ENCOUNTER — Telehealth: Payer: Self-pay | Admitting: *Deleted

## 2012-06-21 ENCOUNTER — Other Ambulatory Visit (HOSPITAL_COMMUNITY): Payer: BC Managed Care – PPO

## 2012-06-21 ENCOUNTER — Other Ambulatory Visit: Payer: Self-pay | Admitting: *Deleted

## 2012-06-21 NOTE — Telephone Encounter (Signed)
Left message for pt on home #; PICC placement scheduled for 06/29/12 at 3pm (come in at 2:30); call office if any questions.

## 2012-06-21 NOTE — Telephone Encounter (Signed)
Med denied. Pt never seen at this office.

## 2012-06-22 ENCOUNTER — Encounter (INDEPENDENT_AMBULATORY_CARE_PROVIDER_SITE_OTHER): Payer: Managed Care, Other (non HMO) | Admitting: General Surgery

## 2012-06-22 ENCOUNTER — Telehealth: Payer: Self-pay | Admitting: *Deleted

## 2012-06-22 ENCOUNTER — Other Ambulatory Visit: Payer: Self-pay | Admitting: *Deleted

## 2012-06-22 ENCOUNTER — Encounter (INDEPENDENT_AMBULATORY_CARE_PROVIDER_SITE_OTHER): Payer: Self-pay

## 2012-06-22 ENCOUNTER — Telehealth (INDEPENDENT_AMBULATORY_CARE_PROVIDER_SITE_OTHER): Payer: Self-pay

## 2012-06-22 NOTE — Telephone Encounter (Signed)
Patient left VM asking nurse to return call about her PICC line placement. Has questions. Attempted to call her back and left VM. Confirmed with Rose in scheduling that POF was in que.

## 2012-06-22 NOTE — Telephone Encounter (Signed)
I called the pt to reschedule her appointment from today.  She is doing well.  Per Dr Maisie Fus she only needs to come in if she is having problems.  She states she will call if she does.  She asked for a letter to be mailed to her stating that Dr Maisie Fus diagnosed her with cancer and when.  I will mail the letter.

## 2012-06-22 NOTE — Telephone Encounter (Signed)
Spoke with patient about PICC line and confirmed with her and MD that treatment plan has changed to weekly low dose CDDP instead of Mito-C and 5FU, so she will not need PICC line. MD would prefer to start treatment on 5/12, same day as her radiation therapy start date if possible. POF to scheduler.

## 2012-06-23 ENCOUNTER — Telehealth: Payer: Self-pay | Admitting: Critical Care Medicine

## 2012-06-23 ENCOUNTER — Telehealth: Payer: Self-pay | Admitting: Oncology

## 2012-06-23 NOTE — Telephone Encounter (Signed)
SENT TANYA BROWN A STAFF MESSAGE TO ADD THE PT IN FOR HER CHEMO APPTS TO START ON 06/28/2012

## 2012-06-23 NOTE — Telephone Encounter (Signed)
Called pt to schd follow up apt. Left message 3x. Not return call back. Sent letter 06/23/12.  °

## 2012-06-28 ENCOUNTER — Ambulatory Visit
Admission: RE | Admit: 2012-06-28 | Discharge: 2012-06-28 | Disposition: A | Discharge status: Home / Self Care | Payer: BC Managed Care – PPO | Source: Ambulatory Visit | Attending: Radiation Oncology | Admitting: Radiation Oncology

## 2012-06-28 ENCOUNTER — Other Ambulatory Visit: Payer: Self-pay | Admitting: *Deleted

## 2012-06-28 ENCOUNTER — Other Ambulatory Visit: Payer: Self-pay | Admitting: Oncology

## 2012-06-28 ENCOUNTER — Ambulatory Visit (HOSPITAL_BASED_OUTPATIENT_CLINIC_OR_DEPARTMENT_OTHER): Payer: BC Managed Care – PPO

## 2012-06-28 VITALS — BP 131/61 | HR 94 | Temp 98.0°F | Resp 18

## 2012-06-28 DIAGNOSIS — C210 Malignant neoplasm of anus, unspecified: Secondary | ICD-10-CM

## 2012-06-28 DIAGNOSIS — C519 Malignant neoplasm of vulva, unspecified: Secondary | ICD-10-CM

## 2012-06-28 DIAGNOSIS — Z5111 Encounter for antineoplastic chemotherapy: Secondary | ICD-10-CM

## 2012-06-28 MED ORDER — PALONOSETRON HCL INJECTION 0.25 MG/5ML
0.2500 mg | Freq: Once | INTRAVENOUS | Status: AC
  Administered 2012-06-28: 0.25 mg via INTRAVENOUS

## 2012-06-28 MED ORDER — SODIUM CHLORIDE 0.9 % IV SOLN
Freq: Once | INTRAVENOUS | Status: AC
  Administered 2012-06-28: 11:00:00 via INTRAVENOUS

## 2012-06-28 MED ORDER — POTASSIUM CHLORIDE 2 MEQ/ML IV SOLN
Freq: Once | INTRAVENOUS | Status: AC
  Administered 2012-06-28: 11:00:00 via INTRAVENOUS
  Filled 2012-06-28: qty 10

## 2012-06-28 MED ORDER — SODIUM CHLORIDE 0.9 % IV SOLN
150.0000 mg | Freq: Once | INTRAVENOUS | Status: AC
  Administered 2012-06-28: 150 mg via INTRAVENOUS
  Filled 2012-06-28: qty 5

## 2012-06-28 MED ORDER — BIAFINE EX EMUL
Freq: Two times a day (BID) | CUTANEOUS | Status: DC
  Administered 2012-06-28: 17:00:00 via TOPICAL

## 2012-06-28 MED ORDER — DEXAMETHASONE SODIUM PHOSPHATE 20 MG/5ML IJ SOLN
12.0000 mg | Freq: Once | INTRAMUSCULAR | Status: AC
  Administered 2012-06-28: 12 mg via INTRAVENOUS

## 2012-06-28 MED ORDER — SODIUM CHLORIDE 0.9 % IV SOLN
60.0000 mg | Freq: Once | INTRAVENOUS | Status: AC
  Administered 2012-06-28: 60 mg via INTRAVENOUS
  Filled 2012-06-28: qty 60

## 2012-06-28 NOTE — Patient Instructions (Addendum)
[facility] Discharge Instructions for Patients Receiving Chemotherapy  Today you received the following chemotherapy agents Cisplatin To help prevent nausea and vomiting after your treatment, we encourage you to take your nausea medication as prescribed.  If you develop nausea and vomiting that is not controlled by your nausea medication, call the clinic. If it is after clinic hours your family physician or the after hours number for the clinic or go to the Emergency Department.   BELOW ARE SYMPTOMS THAT SHOULD BE REPORTED IMMEDIATELY:  *FEVER GREATER THAN 100.5 F  *CHILLS WITH OR WITHOUT FEVER  NAUSEA AND VOMITING THAT IS NOT CONTROLLED WITH YOUR NAUSEA MEDICATION  *UNUSUAL SHORTNESS OF BREATH  *UNUSUAL BRUISING OR BLEEDING  TENDERNESS IN MOUTH AND THROAT WITH OR WITHOUT PRESENCE OF ULCERS  *URINARY PROBLEMS  *BOWEL PROBLEMS  UNUSUAL RASH Items with * indicate a potential emergency and should be followed up as soon as possible.  One of the nurses will contact you 24 hours after your treatment. Please let the nurse know about any problems that you may have experienced. Feel free to call the clinic you have any questions or concerns. The clinic phone number is [phone].   I have been informed and understand all the instructions given to me. I know to contact the clinic, my physician, or go to the Emergency Department if any problems should occur. I do not have any questions at this time, but understand that I may call the clinic during office hours   should I have any questions or need assistance in obtaining follow up care.    __________________________________________  _____________  __________ Signature of Patient or Authorized Representative            Date                   Time    __________________________________________ Nurse's Signature    

## 2012-06-28 NOTE — Progress Notes (Signed)
Sherry Bautista here for post sim education.  She was given the Radiation Therapy and You book.  She was educated on the potential side effects of radiation including fatigue, diarrhea, skin changes and bladder changes.  She was also given biafine gel to apply if she had any redness from treatment and advised her to apply it twice a day with the first application at least 4 hours prior to treatment.  She stated she has been using Vaseline on the area and asked if she should use the biafine gel instead.  Advised patient to stop using the Vaseline and use the biafine.  Also oriented her to the clinic and explained that Dr. Mitzi Hansen sees patients on Fridays during treatment.

## 2012-06-28 NOTE — Addendum Note (Signed)
Encounter addended by: Eduardo Osier, RN on: 06/28/2012  5:14 PM<BR>     Documentation filed: Inpatient MAR

## 2012-06-29 ENCOUNTER — Ambulatory Visit (HOSPITAL_COMMUNITY): Payer: BC Managed Care – PPO

## 2012-06-29 ENCOUNTER — Telehealth: Payer: Self-pay | Admitting: Oncology

## 2012-06-29 ENCOUNTER — Inpatient Hospital Stay (HOSPITAL_COMMUNITY): Admission: RE | Admit: 2012-06-29 | Payer: BC Managed Care – PPO | Source: Ambulatory Visit

## 2012-06-29 ENCOUNTER — Ambulatory Visit
Admission: RE | Admit: 2012-06-29 | Discharge: 2012-06-29 | Disposition: A | Discharge status: Home / Self Care | Payer: BC Managed Care – PPO | Source: Ambulatory Visit | Attending: Radiation Oncology | Admitting: Radiation Oncology

## 2012-06-29 ENCOUNTER — Telehealth: Payer: Self-pay | Admitting: *Deleted

## 2012-06-29 NOTE — Telephone Encounter (Signed)
Talked to pt , she informed me that she is not supposed to have a lab on 07/01/12

## 2012-06-29 NOTE — Telephone Encounter (Signed)
LEFT MESSAGE ON PT.'S HOME VOICE MAIL FOR A RETURNED CALL. REMINDED PT. THERE IS AN ON CALL PHYSICIAN AVAILABLE WHEN THE OFFICE IS CLOSED IF SHE HAS ANY PROBLEMS.

## 2012-06-29 NOTE — Telephone Encounter (Signed)
SENT Sherry Bautista AN EMAIL TO SET UP THE PT FOR HER WEEKLY CHEMO APPTS

## 2012-06-30 ENCOUNTER — Ambulatory Visit
Admission: RE | Admit: 2012-06-30 | Discharge: 2012-06-30 | Disposition: A | Discharge status: Home / Self Care | Payer: BC Managed Care – PPO | Source: Ambulatory Visit | Attending: Radiation Oncology | Admitting: Radiation Oncology

## 2012-07-01 ENCOUNTER — Encounter: Payer: BC Managed Care – PPO | Admitting: Nutrition

## 2012-07-01 ENCOUNTER — Ambulatory Visit: Payer: BC Managed Care – PPO | Admitting: Nurse Practitioner

## 2012-07-01 ENCOUNTER — Ambulatory Visit: Payer: BC Managed Care – PPO

## 2012-07-01 ENCOUNTER — Other Ambulatory Visit: Payer: BC Managed Care – PPO | Admitting: Lab

## 2012-07-02 ENCOUNTER — Other Ambulatory Visit: Payer: Self-pay | Admitting: *Deleted

## 2012-07-02 ENCOUNTER — Ambulatory Visit
Admission: RE | Admit: 2012-07-02 | Discharge: 2012-07-02 | Disposition: A | Discharge status: Home / Self Care | Payer: BC Managed Care – PPO | Source: Ambulatory Visit | Attending: Radiation Oncology | Admitting: Radiation Oncology

## 2012-07-02 VITALS — BP 126/61 | HR 102 | Temp 98.1°F | Ht 64.0 in | Wt 115.3 lb

## 2012-07-02 DIAGNOSIS — C519 Malignant neoplasm of vulva, unspecified: Secondary | ICD-10-CM

## 2012-07-02 DIAGNOSIS — C210 Malignant neoplasm of anus, unspecified: Secondary | ICD-10-CM

## 2012-07-02 NOTE — Progress Notes (Signed)
   Department of Radiation Oncology  Phone:  [phone] Fax:        [phone]  Weekly Treatment Note    Name: Sherry Bautista Date: 07/02/2012 MRN: [id number] DOB: 1955/04/20   Current dose: 7.2 Gy   Current fraction: 4   MEDICATIONS:  Current Outpatient Prescriptions   Medication  Sig  Dispense  Refill   .  amLODipine (NORVASC) 10 MG tablet  Take 10 mg by mouth daily.     Marland Kitchen  aspirin 325 MG buffered tablet  Take 325 mg by mouth daily.     Marland Kitchen  HYDROcodone-acetaminophen (NORCO/VICODIN) 5-325 MG per tablet  Take 1 tablet by mouth every 8 (eight) hours as needed for pain.  10 tablet  0   .  simvastatin (ZOCOR) 20 MG tablet  Take 20 mg by mouth daily.     .  tamsulosin (FLOMAX) 0.4 MG CAPS  Take 0.4 mg by mouth daily.      No current facility-administered medications for this encounter.    ALLERGIES: Review of patient's allergies indicates no known allergies.   LABORATORY DATA:  Lab Results   Component  Value  Date    WBC  5.7  06/06/2012    HGB  13.6  06/06/2012    HCT  38.6*  06/06/2012    MCV  90.0  06/06/2012    PLT  201  06/06/2012    Lab Results   Component  Value  Date    NA  139  06/06/2012    K  4.1  06/06/2012    CL  101  06/06/2012    CO2  31  06/06/2012    Lab Results   Component  Value  Date    ALT  <5  06/06/2012    AST  16  06/06/2012    ALKPHOS  87  06/06/2012    BILITOT  0.3  06/06/2012    NARRATIVE: The patient was seen today for weekly treatment management. The chart was checked and the patient's films were reviewed. The patient is doing well overall with her treatment. She did have some diarrhea yesterday. This has resolved.   PHYSICAL EXAMINATION weight 115.3 pounds; alert, in no acute distress   ASSESSMENT: The patient is doing satisfactorily with treatment.   PLAN: We will continue with the patient's radiation treatment as planned. I discussed the patient using Imodium as needed.

## 2012-07-02 NOTE — Progress Notes (Signed)
  Radiation Oncology         [phone] ________________________________  Name: Sherry Bautista MRN: [id number]  Date: 06/16/2012  DOB: [birth year]  SIMULATION AND TREATMENT PLANNING NOTE   CONSENT VERIFIED: yes   SET UP: Patient is set-up supine   IMMOBILIZATION: The following immobilization is used: alpha-cradle. This complex treatment device will be used on a daily basis during the patient's treatment.   Diagnosis: Anal cancer   NARRATIVE: The patient was brought to the CT Simulation planning suite. Identity was confirmed. All relevant records and images related to the planned course of therapy were reviewed. Then, the patient was positioned in a stable reproducible clinical set-up for radiation therapy using an aquaplast mask. Skin markings were placed. The CT images were loaded into the planning software where the target and avoidance structures were contoured.The radiation prescription was entered and confirmed.   The patient will receive 54 Gy in 30 fractions to the high-dose target region.  Daily image guidance is ordered, and this will be used on a daily basis. This is necessary to ensure accurate and precise localization of the target in addition to accurate alignment of the normal tissue structures in this region. This is particularly important given the possible motion of the high-dose target.  Treatment planning then occurred.   I have requested : Intensity Modulated Radiotherapy (IMRT) is medically necessary for this case for the following reason: Dose homogeneity; the target is in close proximity to critical normal structures, including the femoral heads, bladder, and small bowel. IMRT is thus medically to appropriately treat the patient.   Special treatment procedure  The patient will receive chemotherapy during the course of radiation treatment. The patient may experience increased or overlapping toxicity due to this combined-modality approach and the patient will be  monitored for such problems. This may include extra lab work as necessary. This therefore constitutes a special treatment procedure.     ________________________________  Radene Gunning, MD, PhD

## 2012-07-02 NOTE — Progress Notes (Signed)
Sherry Bautista here for weekly under treat visit.  She has had 4/30 fractions to her anal area.  She does have pain in her abdomen that she is rating at a 5/10.  She denies nausea and states her appetite is poor and she has to force herself to eat.  She had diarrhea yesterday and took 3 doses of kaopectate that helped slow the diarrhea down.  She did miss treatment yesterday because of this.

## 2012-07-04 ENCOUNTER — Other Ambulatory Visit: Payer: Self-pay | Admitting: Oncology

## 2012-07-05 ENCOUNTER — Other Ambulatory Visit: Payer: BC Managed Care – PPO | Admitting: Lab

## 2012-07-05 ENCOUNTER — Encounter: Payer: BC Managed Care – PPO | Admitting: Nutrition

## 2012-07-05 ENCOUNTER — Ambulatory Visit: Payer: BC Managed Care – PPO

## 2012-07-05 ENCOUNTER — Other Ambulatory Visit: Payer: Self-pay | Admitting: *Deleted

## 2012-07-05 ENCOUNTER — Telehealth: Payer: Self-pay | Admitting: Critical Care Medicine

## 2012-07-05 MED ORDER — MOMETASONE FURO-FORMOTEROL FUM 200-5 MCG/ACT IN AERO: INHALATION_SPRAY | RESPIRATORY_TRACT | Status: DC

## 2012-07-05 NOTE — Progress Notes (Signed)
Treatment cancelled today per MD. 

## 2012-07-05 NOTE — Telephone Encounter (Signed)
Refill sent by [facility] this AM. Pt is aware. Carron Curie, CMA

## 2012-07-06 ENCOUNTER — Ambulatory Visit: Payer: BC Managed Care – PPO | Admitting: Lab

## 2012-07-06 ENCOUNTER — Telehealth: Payer: Self-pay | Admitting: Oncology

## 2012-07-06 ENCOUNTER — Ambulatory Visit: Admission: RE | Admit: 2012-07-06 | Payer: BC Managed Care – PPO | Source: Ambulatory Visit

## 2012-07-06 ENCOUNTER — Encounter: Payer: Self-pay | Admitting: Pharmacist

## 2012-07-06 ENCOUNTER — Telehealth: Payer: Self-pay | Admitting: *Deleted

## 2012-07-06 ENCOUNTER — Other Ambulatory Visit: Payer: Self-pay | Admitting: *Deleted

## 2012-07-06 NOTE — Telephone Encounter (Signed)
pt was sick today and wanted to r/s to tomorrow

## 2012-07-06 NOTE — Telephone Encounter (Signed)
Left VM for patient that she needs to have her chemo labs today at 4pm to be able to get treatment tomorrow at 0830. Will have MD see her while she is here for treatment tomorrow.

## 2012-07-07 ENCOUNTER — Telehealth: Payer: Self-pay | Admitting: Oncology

## 2012-07-07 ENCOUNTER — Other Ambulatory Visit: Payer: Self-pay

## 2012-07-07 ENCOUNTER — Emergency Department (HOSPITAL_COMMUNITY): Payer: BC Managed Care – PPO

## 2012-07-07 ENCOUNTER — Ambulatory Visit (HOSPITAL_BASED_OUTPATIENT_CLINIC_OR_DEPARTMENT_OTHER): Payer: BC Managed Care – PPO

## 2012-07-07 ENCOUNTER — Ambulatory Visit (HOSPITAL_BASED_OUTPATIENT_CLINIC_OR_DEPARTMENT_OTHER): Payer: BC Managed Care – PPO | Admitting: Oncology

## 2012-07-07 ENCOUNTER — Other Ambulatory Visit: Payer: Self-pay | Admitting: *Deleted

## 2012-07-07 ENCOUNTER — Encounter (HOSPITAL_COMMUNITY): Payer: Self-pay | Admitting: Emergency Medicine

## 2012-07-07 ENCOUNTER — Other Ambulatory Visit (HOSPITAL_BASED_OUTPATIENT_CLINIC_OR_DEPARTMENT_OTHER): Payer: BC Managed Care – PPO | Admitting: Lab

## 2012-07-07 ENCOUNTER — Inpatient Hospital Stay (HOSPITAL_COMMUNITY)
Admission: EM | Admit: 2012-07-07 | Discharge: 2012-07-08 | DRG: 087 | Disposition: A | Discharge status: Home / Self Care | Payer: BC Managed Care – PPO | Attending: Internal Medicine | Admitting: Internal Medicine

## 2012-07-07 ENCOUNTER — Ambulatory Visit: Payer: BC Managed Care – PPO

## 2012-07-07 VITALS — BP 123/72 | HR 92 | Temp 97.7°F | Resp 22

## 2012-07-07 VITALS — Ht 64.0 in | Wt 114.2 lb

## 2012-07-07 DIAGNOSIS — F17200 Nicotine dependence, unspecified, uncomplicated: Secondary | ICD-10-CM

## 2012-07-07 DIAGNOSIS — T50995A Adverse effect of other drugs, medicaments and biological substances, initial encounter: Secondary | ICD-10-CM | POA: Diagnosis present

## 2012-07-07 DIAGNOSIS — F329 Major depressive disorder, single episode, unspecified: Secondary | ICD-10-CM

## 2012-07-07 DIAGNOSIS — R197 Diarrhea, unspecified: Secondary | ICD-10-CM

## 2012-07-07 DIAGNOSIS — J449 Chronic obstructive pulmonary disease, unspecified: Secondary | ICD-10-CM

## 2012-07-07 DIAGNOSIS — D689 Coagulation defect, unspecified: Secondary | ICD-10-CM

## 2012-07-07 DIAGNOSIS — C801 Malignant (primary) neoplasm, unspecified: Secondary | ICD-10-CM

## 2012-07-07 DIAGNOSIS — R221 Localized swelling, mass and lump, neck: Secondary | ICD-10-CM | POA: Diagnosis present

## 2012-07-07 DIAGNOSIS — Z9981 Dependence on supplemental oxygen: Secondary | ICD-10-CM

## 2012-07-07 DIAGNOSIS — F3289 Other specified depressive episodes: Secondary | ICD-10-CM

## 2012-07-07 DIAGNOSIS — R232 Flushing: Secondary | ICD-10-CM | POA: Diagnosis present

## 2012-07-07 DIAGNOSIS — C519 Malignant neoplasm of vulva, unspecified: Secondary | ICD-10-CM

## 2012-07-07 DIAGNOSIS — G893 Neoplasm related pain (acute) (chronic): Secondary | ICD-10-CM

## 2012-07-07 DIAGNOSIS — E8729 Other acidosis: Secondary | ICD-10-CM

## 2012-07-07 DIAGNOSIS — J9620 Acute and chronic respiratory failure, unspecified whether with hypoxia or hypercapnia: Principal | ICD-10-CM

## 2012-07-07 DIAGNOSIS — R51 Headache: Secondary | ICD-10-CM

## 2012-07-07 DIAGNOSIS — R0902 Hypoxemia: Secondary | ICD-10-CM

## 2012-07-07 DIAGNOSIS — C210 Malignant neoplasm of anus, unspecified: Secondary | ICD-10-CM

## 2012-07-07 DIAGNOSIS — R609 Edema, unspecified: Secondary | ICD-10-CM

## 2012-07-07 DIAGNOSIS — I2789 Other specified pulmonary heart diseases: Secondary | ICD-10-CM

## 2012-07-07 DIAGNOSIS — R7309 Other abnormal glucose: Secondary | ICD-10-CM

## 2012-07-07 DIAGNOSIS — E041 Nontoxic single thyroid nodule: Secondary | ICD-10-CM

## 2012-07-07 DIAGNOSIS — R220 Localized swelling, mass and lump, head: Secondary | ICD-10-CM | POA: Diagnosis present

## 2012-07-07 DIAGNOSIS — A084 Viral intestinal infection, unspecified: Secondary | ICD-10-CM

## 2012-07-07 DIAGNOSIS — E872 Acidosis: Secondary | ICD-10-CM

## 2012-07-07 DIAGNOSIS — Z87898 Personal history of other specified conditions: Secondary | ICD-10-CM

## 2012-07-07 DIAGNOSIS — T7840XA Allergy, unspecified, initial encounter: Secondary | ICD-10-CM

## 2012-07-07 DIAGNOSIS — J9690 Respiratory failure, unspecified, unspecified whether with hypoxia or hypercapnia: Secondary | ICD-10-CM

## 2012-07-07 DIAGNOSIS — J9611 Chronic respiratory failure with hypoxia: Secondary | ICD-10-CM

## 2012-07-07 DIAGNOSIS — J9600 Acute respiratory failure, unspecified whether with hypoxia or hypercapnia: Secondary | ICD-10-CM

## 2012-07-07 DIAGNOSIS — G9349 Other encephalopathy: Secondary | ICD-10-CM | POA: Diagnosis present

## 2012-07-07 DIAGNOSIS — J301 Allergic rhinitis due to pollen: Secondary | ICD-10-CM

## 2012-07-07 DIAGNOSIS — J4489 Other specified chronic obstructive pulmonary disease: Secondary | ICD-10-CM

## 2012-07-07 DIAGNOSIS — F411 Generalized anxiety disorder: Secondary | ICD-10-CM

## 2012-07-07 LAB — CBC WITH DIFFERENTIAL/PLATELET
Eosinophils Absolute: 0.1 10*3/uL (ref 0.0–0.5)
HCT: 37.7 % (ref 34.8–46.6)
LYMPH%: 10 % — ABNORMAL LOW (ref 14.0–49.7)
MONO#: 0.7 10*3/uL (ref 0.1–0.9)
NEUT#: 6.3 10*3/uL (ref 1.5–6.5)
NEUT%: 78.9 % — ABNORMAL HIGH (ref 38.4–76.8)
Platelets: 259 10*3/uL (ref 145–400)
RBC: 3.71 10*6/uL (ref 3.70–5.45)
WBC: 8 10*3/uL (ref 3.9–10.3)
nRBC: 0 % (ref 0–0)

## 2012-07-07 LAB — BASIC METABOLIC PANEL
CO2: 31 mEq/L (ref 19–32)
Calcium: 8.6 mg/dL (ref 8.4–10.5)
Chloride: 100 mEq/L (ref 96–112)
Creatinine, Ser: 0.53 mg/dL (ref 0.50–1.10)
GFR calc Af Amer: >90 mL/min (ref >90)
Sodium: 138 mEq/L (ref 135–145)

## 2012-07-07 LAB — BASIC METABOLIC PANEL (CC13)
CO2: 34 mEq/L — ABNORMAL HIGH (ref 22–29)
Chloride: 96 mEq/L — ABNORMAL LOW (ref 98–107)
Creatinine: 0.6 mg/dL (ref 0.6–1.1)
Potassium: 3.9 mEq/L (ref 3.5–5.1)

## 2012-07-07 LAB — BLOOD GAS, ARTERIAL
Bicarbonate: 31.4 mEq/L — ABNORMAL HIGH (ref 20.0–24.0)
Expiratory PAP: 8
TCO2: 30.3 mmol/L (ref 0–100)
pCO2 arterial: 90.3 mmHg (ref 35.0–45.0)
pH, Arterial: 7.167 — CL (ref 7.350–7.450)

## 2012-07-07 LAB — GLUCOSE, CAPILLARY: Glucose-Capillary: 138 mg/dL — ABNORMAL HIGH (ref 70–99)

## 2012-07-07 LAB — MRSA PCR SCREENING: MRSA by PCR: NEGATIVE

## 2012-07-07 MED ORDER — POTASSIUM CHLORIDE 2 MEQ/ML IV SOLN
Freq: Once | INTRAVENOUS | Status: AC
  Administered 2012-07-07: 09:00:00 via INTRAVENOUS
  Filled 2012-07-07: qty 10

## 2012-07-07 MED ORDER — DOCUSATE SODIUM 100 MG PO CAPS
100.0000 mg | ORAL_CAPSULE | Freq: Two times a day (BID) | ORAL | Status: DC
  Administered 2012-07-07 (×2): 100 mg via ORAL
  Filled 2012-07-07 (×4): qty 1

## 2012-07-07 MED ORDER — INSULIN ASPART 100 UNIT/ML ~~LOC~~ SOLN
0.0000 [IU] | Freq: Three times a day (TID) | SUBCUTANEOUS | Status: DC
  Administered 2012-07-07: 3 [IU] via SUBCUTANEOUS

## 2012-07-07 MED ORDER — METHYLPREDNISOLONE SODIUM SUCC 125 MG IJ SOLR
60.0000 mg | Freq: Three times a day (TID) | INTRAMUSCULAR | Status: DC
  Administered 2012-07-07 – 2012-07-08 (×3): 60 mg via INTRAVENOUS
  Filled 2012-07-07: qty 0.96
  Filled 2012-07-07: qty 2
  Filled 2012-07-07 (×4): qty 0.96

## 2012-07-07 MED ORDER — ALBUTEROL SULFATE HFA 108 (90 BASE) MCG/ACT IN AERS: 2.0000 | INHALATION_SPRAY | RESPIRATORY_TRACT | Status: DC | PRN

## 2012-07-07 MED ORDER — ALBUTEROL SULFATE (5 MG/ML) 0.5% IN NEBU
2.5000 mg | INHALATION_SOLUTION | Freq: Once | RESPIRATORY_TRACT | Status: AC
  Administered 2012-07-07: 2.5 mg via RESPIRATORY_TRACT

## 2012-07-07 MED ORDER — SODIUM CHLORIDE 0.9 % IV SOLN
150.0000 mg | Freq: Once | INTRAVENOUS | Status: AC
  Administered 2012-07-07: 150 mg via INTRAVENOUS
  Filled 2012-07-07: qty 5

## 2012-07-07 MED ORDER — METHYLPREDNISOLONE SODIUM SUCC 125 MG IJ SOLR
125.0000 mg | Freq: Once | INTRAMUSCULAR | Status: AC
  Administered 2012-07-07: 125 mg via INTRAVENOUS

## 2012-07-07 MED ORDER — DIPHENHYDRAMINE HCL 50 MG/ML IJ SOLN
25.0000 mg | Freq: Four times a day (QID) | INTRAMUSCULAR | Status: DC
  Administered 2012-07-07: 25 mg via INTRAVENOUS
  Filled 2012-07-07: qty 1

## 2012-07-07 MED ORDER — PAROXETINE HCL ER 25 MG PO TB24
25.0000 mg | ORAL_TABLET | Freq: Two times a day (BID) | ORAL | Status: DC
  Administered 2012-07-07 – 2012-07-08 (×2): 25 mg via ORAL
  Filled 2012-07-07 (×3): qty 1

## 2012-07-07 MED ORDER — TIOTROPIUM BROMIDE MONOHYDRATE 18 MCG IN CAPS
18.0000 ug | ORAL_CAPSULE | Freq: Every day | RESPIRATORY_TRACT | Status: DC
  Administered 2012-07-08: 18 ug via RESPIRATORY_TRACT
  Filled 2012-07-07: qty 5

## 2012-07-07 MED ORDER — ACETAMINOPHEN 325 MG PO TABS: 650.0000 mg | ORAL_TABLET | Freq: Four times a day (QID) | ORAL | Status: DC | PRN

## 2012-07-07 MED ORDER — IBUPROFEN 200 MG PO TABS: 200.0000 mg | ORAL_TABLET | Freq: Four times a day (QID) | ORAL | Status: AC | PRN

## 2012-07-07 MED ORDER — PALONOSETRON HCL INJECTION 0.25 MG/5ML
0.2500 mg | Freq: Once | INTRAVENOUS | Status: AC
  Administered 2012-07-07: 0.25 mg via INTRAVENOUS

## 2012-07-07 MED ORDER — ALBUTEROL SULFATE (5 MG/ML) 0.5% IN NEBU: 2.5000 mg | INHALATION_SOLUTION | RESPIRATORY_TRACT | Status: DC | PRN

## 2012-07-07 MED ORDER — IPRATROPIUM BROMIDE 0.02 % IN SOLN
0.5000 mg | Freq: Once | RESPIRATORY_TRACT | Status: AC
  Administered 2012-07-07: 0.5 mg via RESPIRATORY_TRACT

## 2012-07-07 MED ORDER — MOMETASONE FURO-FORMOTEROL FUM 200-5 MCG/ACT IN AERO
2.0000 | INHALATION_SPRAY | Freq: Two times a day (BID) | RESPIRATORY_TRACT | Status: DC
  Administered 2012-07-07 – 2012-07-08 (×2): 2 via RESPIRATORY_TRACT
  Filled 2012-07-07: qty 8.8

## 2012-07-07 MED ORDER — DEXAMETHASONE SODIUM PHOSPHATE 20 MG/5ML IJ SOLN
12.0000 mg | Freq: Once | INTRAMUSCULAR | Status: AC
  Administered 2012-07-07: 12 mg via INTRAVENOUS

## 2012-07-07 MED ORDER — ENOXAPARIN SODIUM 40 MG/0.4ML ~~LOC~~ SOLN
40.0000 mg | SUBCUTANEOUS | Status: DC
  Administered 2012-07-07: 40 mg via SUBCUTANEOUS
  Filled 2012-07-07 (×2): qty 0.4

## 2012-07-07 MED ORDER — INSULIN ASPART 100 UNIT/ML ~~LOC~~ SOLN: 0.0000 [IU] | Freq: Every day | SUBCUTANEOUS | Status: DC

## 2012-07-07 MED ORDER — FAMOTIDINE IN NACL 20-0.9 MG/50ML-% IV SOLN
20.0000 mg | Freq: Two times a day (BID) | INTRAVENOUS | Status: DC
  Administered 2012-07-07 (×2): 20 mg via INTRAVENOUS
  Filled 2012-07-07 (×3): qty 50

## 2012-07-07 MED ORDER — BUPROPION HCL ER (SR) 150 MG PO TB12
150.0000 mg | ORAL_TABLET | Freq: Two times a day (BID) | ORAL | Status: DC
  Administered 2012-07-07 – 2012-07-08 (×2): 150 mg via ORAL
  Filled 2012-07-07 (×3): qty 1

## 2012-07-07 MED ORDER — DIPHENHYDRAMINE HCL 50 MG/ML IJ SOLN
INTRAMUSCULAR | Status: AC
  Filled 2012-07-07: qty 1

## 2012-07-07 MED ORDER — DIPHENHYDRAMINE HCL 50 MG/ML IJ SOLN
12.5000 mg | Freq: Four times a day (QID) | INTRAMUSCULAR | Status: DC
  Administered 2012-07-07 – 2012-07-08 (×3): 12.5 mg via INTRAVENOUS
  Filled 2012-07-07 (×2): qty 1
  Filled 2012-07-07 (×3): qty 0.25
  Filled 2012-07-07: qty 1
  Filled 2012-07-07: qty 0.25
  Filled 2012-07-07: qty 1

## 2012-07-07 MED ORDER — ALBUTEROL SULFATE (5 MG/ML) 0.5% IN NEBU: 2.5000 mg | INHALATION_SOLUTION | Freq: Once | RESPIRATORY_TRACT | Status: DC

## 2012-07-07 MED ORDER — EPINEPHRINE 0.3 MG/0.3ML IJ SOAJ
0.3000 mg | Freq: Once | INTRAMUSCULAR | Status: AC
  Administered 2012-07-07: 0.3 mg via INTRAMUSCULAR

## 2012-07-07 MED ORDER — SODIUM CHLORIDE 0.9 % IV SOLN
60.0000 mg | Freq: Once | INTRAVENOUS | Status: DC
  Filled 2012-07-07: qty 60

## 2012-07-07 MED ORDER — DIPHENHYDRAMINE HCL 50 MG/ML IJ SOLN
25.0000 mg | Freq: Once | INTRAMUSCULAR | Status: AC
  Administered 2012-07-07: 25 mg via INTRAVENOUS

## 2012-07-07 MED ORDER — DEXAMETHASONE 4 MG PO TABS
4.0000 mg | ORAL_TABLET | Freq: Two times a day (BID) | ORAL | Status: DC
Start: 2012-07-07 — End: 2012-07-07

## 2012-07-07 MED ORDER — SODIUM CHLORIDE 0.9 % IV SOLN
Freq: Once | INTRAVENOUS | Status: AC
  Administered 2012-07-07: 09:00:00 via INTRAVENOUS

## 2012-07-07 NOTE — ED Provider Notes (Signed)
History     CSN: [id number]  Arrival date & time 07/07/12  1225   First MD Initiated Contact with Patient 07/07/12 1226      Chief Complaint  Patient presents with  . Allergic Reaction  . Illegal value: [    unresponsive    (Consider location/radiation/quality/duration/timing/severity/associated sxs/prior treatment) Patient is a 57 y.o. female presenting with allergic reaction.  Allergic Reaction  Level 5 caveat due to unresponsive. I was called to the [facility] treatment area as a Code Blue for patient having difficulty breathing. She has history of COPD, had been given Decadron and was part way through infusion of Amend when she began complaining of SOB. By report her SpO2 dropped into the 80s and she became unresponsive. She was given Benadryl 25mg  IV, albuterol neb and Rapid Response was called. On my arrival, she was unresponsive to verbal or painful stimuli, sitting in treatment chair, but SpO2 was 92%. She was transitioned to NRB when neb was finished, moved to a stretcher and brought to the ED where she was placed on BiPAP, given additional benadryl, solumedrol and EpiPen IM. Labs done earlier today at the [facility] were reviewed.   Past Medical History  Diagnosis Date  . Tobacco use disorder   . Depression   . Anxiety   . Allergic rhinitis     allergy shots  w/o significant response  . Hyperglycemia   . COPD (chronic obstructive pulmonary disease)   . Cancer 05/11/12    rectal cancer  . Bartholin cyst 07/2011  . Recto-vaginal fistula   . Osteopenia     Past Surgical History  Procedure Laterality Date  . Nasal sinus surgery    . Tonsillectomy    . Knee surgery  2003    cartlage repair  . Breast lumpectomy Left 15-20 yrs ago    benign cyst  . Rectal abscess removal    . Colonoscopy N/A 05/31/2012    Procedure: COLONOSCOPY;  Surgeon: Romie Levee, MD;  Location: [facility];  Service: Endoscopy;  Laterality: N/A;  . Cardiac catherization      no blockages     Family History  Problem Relation Age of Onset  . Lung cancer Father   . COPD Father   . Hyperlipidemia Father   . Cancer Father   . Factor IX deficiency Mother     PTE  . Asthma Sister   . Hypertension Sister   . Heart attack Maternal Grandmother   . Heart attack Maternal Grandfather   . Heart attack Paternal Grandmother     History  Substance Use Topics  . Smoking status: Current Every Day Smoker -- 1.00 packs/day for 30 years    Types: Cigarettes  . Smokeless tobacco: Never Used     Comment: started smoking at age 73.  Currently smoking 1ppd, smoking cessation info given  . Alcohol Use: No     Comment: very rarely    OB History   Grav Para Term Preterm Abortions TAB SAB Ect Mult Living                  Review of Systems Unable to assess due to mental status.   Allergies  Azithromycin; Tetanus-diphtheria toxoids td; and Codeine sulfate  Home Medications   Current Outpatient Rx  Name  Route  Sig  Dispense  Refill  . albuterol (PROAIR HFA) 108 (90 BASE) MCG/ACT inhaler   Inhalation   Inhale 2 puffs into the lungs every 4 (four) hours as needed.  1 Inhaler   3   . ALPRAZolam (XANAX) 0.25 MG tablet   Oral   Take 1 tablet (0.25 mg total) by mouth daily as needed.   30 tablet   0     **APPOINTMENT DUE**   . AMBULATORY NON FORMULARY MEDICATION      Oxygen: 3 liters at rest and 4 liters with activity          . buPROPion (WELLBUTRIN SR) 150 MG 12 hr tablet   Oral   Take 1 tablet (150 mg total) by mouth 2 (two) times daily.   60 tablet   2   . dexamethasone (DECADRON) 4 MG tablet   Oral   Take 1 tablet (4 mg total) by mouth 2 (two) times daily with a meal. On day # 2 & # 3 of each chemo treatment (weekly chemo)   16 tablet   0   . docusate sodium (COLACE) 100 MG capsule   Oral   Take 1 capsule (100 mg total) by mouth 2 (two) times daily.   10 capsule   0   . ibuprofen (ADVIL) 200 MG tablet   Oral   Take 1-2 tablets (200-400 mg total) by  mouth every 6 (six) hours as needed for pain.   30 tablet   0   . mometasone-formoterol (DULERA) 200-5 MCG/ACT AERO      USE 2 PUFFS EVERY 12 HOURS ; GARGLE & SPIT AFTER USE   13 g   5   . oxycodone (OXY-IR) 5 MG capsule   Oral   Take 5 mg by mouth every 6 (six) hours as needed.         Marland Kitchen PARoxetine (PAXIL-CR) 25 MG 24 hr tablet   Oral   Take 1 tablet (25 mg total) by mouth 2 (two) times daily.   60 tablet   3     Pls defer future refills to PCP.  Thank you.   . prochlorperazine (COMPAZINE) 10 MG tablet   Oral   Take 1 tablet (10 mg total) by mouth every 6 (six) hours as needed (nausea).   60 tablet   1     BP 179/74  Resp 21  SpO2 100%  Physical Exam  Nursing note and vitals reviewed. Constitutional: She appears well-developed and well-nourished.  HENT:  Head: Normocephalic and atraumatic.  Eyes: EOM are normal. Pupils are equal, round, and reactive to light.  Neck: Normal range of motion. Neck supple.  Cardiovascular: Normal rate, normal heart sounds and intact distal pulses.   Pulmonary/Chest: No stridor.  Decreased air movement bilaterally, increased WOB, no wheezing  Abdominal: Bowel sounds are normal. She exhibits no distension. There is no tenderness.  Musculoskeletal: Normal range of motion. She exhibits no edema and no tenderness.  Neurological:  Moving all extremities  Skin: Skin is warm and dry. No rash noted.  Psychiatric: She has a normal mood and affect.    ED Course  Procedures (including critical care time)  Labs Reviewed  BLOOD GAS, ARTERIAL - Abnormal; Notable for the following:    pH, Arterial 7.167 (*)    pCO2 arterial 90.3 (*)    pO2, Arterial 496.0 (*)    Bicarbonate 31.4 (*)    All other components within normal limits  BASIC METABOLIC PANEL - Abnormal; Notable for the following:    Glucose, Bld 160 (*)    All other components within normal limits  TROPONIN I  PRO B NATRIURETIC PEPTIDE   No results found.  1. Respiratory  failure   2. Allergic reaction, initial encounter   3. 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 Spoke with Dr. Truett Perna (Hem/Onc) and Dr. Sherene Sires (PCCM). Pt will be admitted to ICU for observation.   CRITICAL CARE Performed by: Pollyann Savoy Total critical care time: 45 Critical care time was exclusive of separately billable procedures and treating other patients. Critical care was necessary to treat or prevent imminent or life-threatening deterioration. Critical care was time spent personally by me on the following activities: development of treatment plan with patient and/or surrogate as well as nursing, discussions with consultants, evaluation of patient's response to treatment, examination of patient, obtaining history from patient or surrogate, ordering and performing treatments and interventions, ordering and review of laboratory studies, ordering and review of radiographic studies, pulse oximetry and re-evaluation of patient's condition.          Charles B. [name], MD 07/07/12 1336

## 2012-07-07 NOTE — ED Notes (Signed)
More alert at the moment after tx. Pt responding, talking back and smiling, sitting up in bed with BiPAP, family in Rm.

## 2012-07-07 NOTE — ED Notes (Signed)
Pt from cancer center, had allergic reaction and is now unresponsive.

## 2012-07-07 NOTE — Progress Notes (Signed)
   CARE MANAGEMENT ED NOTE 07/07/2012  Patient:  JASMYN, PICHA   Account Number:  [id number]  Date Initiated:  07/07/2012  Documentation initiated by:  [email or web address],LKGM  Subjective/Objective Assessment:     Subjective/Objective Assessment Detail:     Action/Plan:   Action/Plan Detail:   Anticipated DC Date:  07/09/2012     Status Recommendation to Physician:   Result of Recommendation:    Other ED Services  Consult Working Plan      Choice offered to / List presented to:            Status of service:  Completed, signed off  ED Comments:   ED Comments Detail:  UR-Completed.

## 2012-07-07 NOTE — Progress Notes (Signed)
Support for family member, sister, when code was called for patient. Stayed with sister until she was able to stay in the room with the patient. The sister told me that the patient has "panic attacks" and she relies on her sister to help keep her calm. Presence; listening; prayer.

## 2012-07-07 NOTE — Telephone Encounter (Signed)
Marcelino Duster working on chemo per Dr. Truett Perna, pt will see MD on 6/2 per Dr. Truett Perna , pt notified

## 2012-07-07 NOTE — H&P (Addendum)
PULMONARY  / CRITICAL CARE MEDICINE  Name: Sherry Bautista MRN: [id number] DOB: 1955-12-03    ADMISSION DATE:  07/07/2012 CONSULTATION DATE:  5/21  REFERRING MD :  [name]  PRIMARY SERVICE:  PCCM  CHIEF COMPLAINT:  Acute resp failure   BRIEF PATIENT DESCRIPTION:  69 yof f/b PW for chronic resp failure/ GOLD D, COPD (o2 3-4 liters/ still active smoker). Admitted on 5/21 from cancer center, w/ acute resp failure while receiving her first dose of Emend as a pre-med prior to chemo.   PMH Vulvar/Anal cancer  SIGNIFICANT EVENTS / STUDIES:     HISTORY OF PRESENT ILLNESS:   This is a 80 YOF f/b PW for chronic resp failure/ GOLD D, COPD. Was at cancer center on 5/21 receiving pre-med Emend, when developed sudden onset of throat swelling, facial flushing and SOB. She was placed on high flow Oxygen. Developed progressive mental status change, became unresponsive and rapid response team called. She was treated emergently w/ benadryl, decadron, epi pen IM and steroids. Subsequently placed on NIPPV and transferred to the ER. On PCCM arrival she was awake, alert, and able to come off NIPPV. PCCM asked to admit.  No assoc cough or cp  At baseline doe > room to room. No obvious daytime variabilty or assoc chronic cough or cp or chest tightness, subjective wheeze overt sinus or hb symptoms. No unusual exp hx or h/o childhood pna/ asthma or premature birth to her knowledge.   Sleeping ok without nocturnal  or early am exacerbation  of respiratory  c/o's or need for noct saba. Also denies any obvious fluctuation of symptoms with weather or environmental changes or other aggravating or alleviating factors except as outlined above    PAST MEDICAL HISTORY :  Past Medical History  Diagnosis Date  . Tobacco use disorder   . Depression   . Anxiety   . Allergic rhinitis     allergy shots  w/o significant response  . Hyperglycemia   . COPD (chronic obstructive pulmonary disease)   . Cancer 05/11/12     rectal cancer  . Bartholin cyst 07/2011  . Recto-vaginal fistula   . Osteopenia    Past Surgical History  Procedure Laterality Date  . Nasal sinus surgery    . Tonsillectomy    . Knee surgery  2003    cartlage repair  . Breast lumpectomy Left 15-20 yrs ago    benign cyst  . Rectal abscess removal    . Colonoscopy N/A 05/31/2012    Procedure: COLONOSCOPY;  Surgeon: Romie Levee, MD;  Location: [facility];  Service: Endoscopy;  Laterality: N/A;  . Cardiac catherization      no blockages   Prior to Admission medications   Medication Sig Start Date End Date Taking? Authorizing Provider  albuterol (PROAIR HFA) 108 (90 BASE) MCG/ACT inhaler Inhale 2 puffs into the lungs every 4 (four) hours as needed. 03/04/12   [name], MD  ALPRAZolam [name]) 0.25 MG tablet Take 1 tablet (0.25 mg total) by mouth daily as needed. 09/09/11   Pecola Lawless, MD  AMBULATORY NON FORMULARY MEDICATION Oxygen: 3 liters at rest and 4 liters with activity     Historical Provider, MD  buPROPion (WELLBUTRIN SR) 150 MG 12 hr tablet Take 1 tablet (150 mg total) by mouth 2 (two) times daily. 10/17/11 10/16/12  [name], NP  dexamethasone (DECADRON) 4 MG tablet Take 1 tablet (4 mg total) by mouth 2 (two) times daily with a meal. On day #  2 & # 3 of each chemo treatment (weekly chemo) 07/07/12   [name], MD  docusate sodium (COLACE) 100 MG capsule Take 1 capsule (100 mg total) by mouth 2 (two) times daily. 06/07/12   [name], MD  ibuprofen (ADVIL) 200 MG tablet Take 1-2 tablets (200-400 mg total) by mouth every 6 (six) hours as needed for pain. 07/07/12   [name], MD  mometasone-formoterol [facility]) 200-5 MCG/ACT AERO USE 2 PUFFS EVERY 12 HOURS ; GARGLE & SPIT AFTER USE 07/05/12   [name], MD  oxycodone (OXY-IR) 5 MG capsule Take 5 mg by mouth every 6 (six) hours as needed.    Historical Provider, MD  PARoxetine (PAXIL-CR) 25 MG 24 hr tablet Take 1 tablet (25 mg total) by mouth 2  (two) times daily. 03/04/12   [name], MD  prochlorperazine (COMPAZINE) 10 MG tablet Take 1 tablet (10 mg total) by mouth every 6 (six) hours as needed (nausea). 06/15/12   [name], MD   Allergies  Allergen Reactions  . Azithromycin     rash  . Tetanus-Diphtheria Toxoids Td     Redness, swelling, stiffness of arm   . Codeine Sulfate     itching    FAMILY HISTORY:  Family History  Problem Relation Age of Onset  . Lung cancer Father   . COPD Father   . Hyperlipidemia Father   . Cancer Father   . Factor IX deficiency Mother     PTE  . Asthma Sister   . Hypertension Sister   . Heart attack Maternal Grandmother   . Heart attack Maternal Grandfather   . Heart attack Paternal Grandmother    SOCIAL HISTORY:  reports that she has been smoking Cigarettes.  She has a 30 pack-year smoking history. She has never used smokeless tobacco. She reports that she does not drink alcohol or use illicit drugs.  REVIEW OF SYSTEMS:   Constitutional: Negative for fever, chills, weight loss, malaise/fatigue and diaphoresis.  HENT: Negative for hearing loss, ear pain, nosebleeds, congestion, sore throat, neck pain, tinnitus and ear discharge.   Eyes: Negative for blurred vision, double vision, photophobia, pain, discharge and redness.  Respiratory: Negative for cough, hemoptysis, sputum production, shortness of breath, wheezing and stridor, now resolved.   Cardiovascular: Negative for chest pain, palpitations, orthopnea, claudication, leg swelling and PND.  Gastrointestinal: Negative for heartburn, nausea, vomiting, abdominal pain, diarrhea, constipation, blood in stool and melena.  Genitourinary: Negative for dysuria, urgency, frequency, hematuria and flank pain.  Musculoskeletal: Negative for myalgias, back pain, joint pain and falls.  Skin: Negative for itching and rash.  Neurological: Negative for dizziness, tingling, tremors, sensory change, speech change, focal weakness, seizures,  loss of consciousness, weakness and headaches.  Endo/Heme/Allergies: Negative for environmental allergies and polydipsia. Does not bruise/bleed easily.  SUBJECTIVE:  Still on bipap in ER mod distress but improving vs arrival   VITAL SIGNS: Temp:  [97.7 F (36.5 C)] 97.7 F (36.5 C) (05/21 0901) Pulse Rate:  [92] 92 (05/21 0901) Resp:  [21-22] 21 (05/21 1238) BP: (123-179)/(72-74) 179/74 mmHg (05/21 1238) SpO2:  [97 %-100 %] 100 % (05/21 1238) FiO2 (%):  [35 %-100 %] 35 % (05/21 1307) Weight:  [51.801 kg (114 lb 3.2 oz)] 51.801 kg (114 lb 3.2 oz) (05/21 1022)  PHYSICAL EXAMINATION: General: eldelry wf >>stated age female, in no acute distress.  Neuro:  Fully awake and oriented. No focal def  HEENT:  [place], no stridor, no JVD  Cardiovascular:  rrr Lungs:  Clear/no wheeze Abdomen:  Soft, non-tender + bowel sounds  Musculoskeletal:  Intact/ 2+ pulses Skin:  Intact, warm and dry with mild erythema LUE diffuse    Recent Labs Lab 07/07/12 0807 07/07/12 1235  NA 140 138  K 3.9 4.2  CL 96* 100  CO2 34* 31  BUN 7.8 7  CREATININE 0.6 0.53  GLUCOSE 97 160*    Recent Labs Lab 07/07/12 0807  HGB 11.7  HCT 37.7  WBC 8.0  PLT 259 Large platelets present   Dg Chest Port 1 View  07/07/2012   *RADIOLOGY REPORT*  Clinical Data: Anaphylaxis.  PORTABLE CHEST - 1 VIEW  Comparison: Chest x-ray dated 11/26/2011  Findings: The heart size is normal.  Slight pulmonary vascular prominence consistent with the supine position.  Lungs are hyperinflated consistent with COPD.  No acute infiltrates or effusions.  No pneumothorax.  No acute osseous abnormality.  IMPRESSION: No acute abnormality.  COPD.   Original Report Authenticated By: Francene Boyers, M.D.    ASSESSMENT / PLAN:  Acute on chronic respiratory failure due to acute bronchospasm.  This was transient. Seemingly a reaction to Emend (first time receiving this med as a pre-chemo agent) Plan: Rapid pred taper  H2 blockade Monitor in SDU s  bipap if tolerates  Chronic respiratory failure in setting of GOLD stage D COPD - no pft's on file in emr  O2 dependent 3-4 liters Still active smoker/ active tobacco use  Plan: Continue her maintenance LABA/ICS and spiriva BIPAP and SABA,  PRN Continue oxygen Needs to stop smoking   Acute encephalopathy: almost certainly related to Hypercarbia which was likely exacerbated by high flow supplemental oxygen and is now resolved.  Plan: Monitor   Vulvar/Anal cancer, locally advanced. She has completed one week of radiation and cisplatin. Plan: No acute intervention   Hyperglycemia  Plan ssi   The patient is critically ill with multiple organ systems failure and requires high complexity decision making for assessment and support, frequent evaluation and titration of therapies, application of advanced monitoring technologies and extensive interpretation of multiple databases. Critical Care Time devoted to patient care services described in this note is 45 minutes.   Sandrea Hughs, MD Pulmonary and Critical Care Medicine [facility] Cell [phone] After 5:30 PM or weekends, call [phone]

## 2012-07-07 NOTE — Telephone Encounter (Signed)
Sherry Bautista and Seward Grater are working to get chemo fo 5/27 or 5/28 per MD. Ml visit  appt  scheduled per Dr. Truett Perna

## 2012-07-07 NOTE — Progress Notes (Signed)
   [facility]    OFFICE PROGRESS NOTE   INTERVAL HISTORY:   She returns as scheduled. She saw Dr. Nelly Rout several weeks ago. Dr. Nelly Rout feels the primary site is the vulvar. I discussed the case with Dr. Nelly Rout and we decided to switch the chemotherapy regimen to weekly cisplatin.  She completed a first treatment with cisplatin concurrent with the start of radiation on 06/28/2012. No mouth sores, diarrhea, or neuropathy symptoms. She reports nausea beginning several days after chemotherapy that has persisted. No emesis. The nausea is relieved with Compazine. The anal pain is improved.  Objective:  Vital signs in last 24 hours:  Height 5\' 4"  (1.626 m), weight 114 lb 3.2 oz (51.801 kg), SpO2 97.00%.    HEENT: No thrush or ulcers Resp: Distant breath sounds, scattered inspiratory rhonchi Cardio: Regular rate and rhythm GI: No hepatomegaly, nontender Vascular: No leg edema   Lab Results:  Lab Results  Component Value Date   WBC 8.0 07/07/2012   HGB 11.7 07/07/2012   HCT 37.7 07/07/2012   MCV 101.6* 07/07/2012   PLT 259 Large platelets present 07/07/2012   ANC 6.3    Medications: I have reviewed the patient's current medications.  Assessment/Plan: 1. Vulvar/Anal cancer, locally advanced, staging PET scan revealed hypermetabolic circumferential thickening at the distal rectum/anus, bilateral hypermetabolic inguinal nodes and no evidence of distant metastatic disease. -Initiation of weekly cisplatin and concurrent radiation 06/28/2012 2. 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 Pain secondary to the locally advanced anal cancer -improved 4. COPD-oxygen dependent  5. Hyperplastic polyps on colonoscopy 05/31/2012  6. Nausea following cisplatin chemotherapy-we will add prophylactic Decadron today  Disposition:  She has completed one week of radiation and cisplatin. She appears to be tolerating the treatment well a side from nausea. We will add Decadron on  days 2 and 3.  I discussed the likelihood of vulvar versus anal cancer with Ms. Popwell. She understands both of these cancers are treated with radiation and chemotherapy. We reviewed the potential toxicities associated with cisplatin including the chance for neuropathy, renal toxicity, and nausea. The plan is to continue weekly cisplatin and concurrent radiation. She will complete week #2 today. She will return for an office visit prior to week #4 on 07/19/2012.   Thornton Papas, MD  07/07/2012  11:18 AM

## 2012-07-07 NOTE — Progress Notes (Signed)
This is an addendum to the note dictated earlier today.  Sherry Bautista appeared well when I saw her prior to planned cisplatin chemotherapy. She received Decadron and Aloxi premedication. She was then given Emend premedication. A few minutes into the Emend infusion she developed diffuse erythema and respiratory distress. I was called to evaluate her. The Emend was discontinued. She appeared to be in respiratory distress with minimal air movement in either lung. She was given Benadryl, and an albuterol nebulizer. Her condition worsened over the next several minutes. She was given Solu-Medrol. She became more hypoxic with an oxygen saturation of approximately 80%. She was placed on a nonrebreather mask. She became lethargic. A CODE BLUE was called. She was evaluated by the emergency room and anesthesia staff. She was transferred to the emergency room and given epinephrine.  I saw her approximately 30 minutes later and her condition appeared much improved. She was alert and following commands. She had good air movement in both lungs. Hives were noted over the left arm.    The emergency physician will consult critical care medicine to admit her for ongoing management of respiratory failure. She is now on a BiPAP mask in the emergency room.  I discussed plans for further chemotherapy with Sherry Bautista and her sister. We will consider giving the week 2 cisplatin while she is in the hospital.  I appreciate the care from the emergency room team and critical care medicine.

## 2012-07-07 NOTE — Addendum Note (Signed)
Addended by: Wandalee Ferdinand on: 07/07/2012 11:52 AM   Modules accepted: Orders

## 2012-07-07 NOTE — Progress Notes (Signed)
Patient alert and oriented.  Per patient report, the patient has not had any addiitional admissions to the hospital within the last six months.  The patient does not meet the criteria for COPD Gold.  Kinnie Feil, RN

## 2012-07-08 ENCOUNTER — Ambulatory Visit
Admission: RE | Admit: 2012-07-08 | Discharge: 2012-07-08 | Disposition: A | Discharge status: Home / Self Care | Payer: BC Managed Care – PPO | Source: Ambulatory Visit | Attending: Radiation Oncology | Admitting: Radiation Oncology

## 2012-07-08 ENCOUNTER — Other Ambulatory Visit: Payer: Self-pay | Admitting: *Deleted

## 2012-07-08 ENCOUNTER — Telehealth: Payer: Self-pay | Admitting: Family

## 2012-07-08 ENCOUNTER — Other Ambulatory Visit: Payer: Self-pay | Admitting: Certified Registered Nurse Anesthetist

## 2012-07-08 ENCOUNTER — Telehealth: Payer: Self-pay | Admitting: *Deleted

## 2012-07-08 ENCOUNTER — Telehealth: Payer: Self-pay | Admitting: Oncology

## 2012-07-08 DIAGNOSIS — C210 Malignant neoplasm of anus, unspecified: Secondary | ICD-10-CM

## 2012-07-08 DIAGNOSIS — C211 Malignant neoplasm of anal canal: Secondary | ICD-10-CM

## 2012-07-08 DIAGNOSIS — C519 Malignant neoplasm of vulva, unspecified: Secondary | ICD-10-CM

## 2012-07-08 DIAGNOSIS — J9620 Acute and chronic respiratory failure, unspecified whether with hypoxia or hypercapnia: Principal | ICD-10-CM

## 2012-07-08 DIAGNOSIS — E872 Acidosis: Secondary | ICD-10-CM

## 2012-07-08 DIAGNOSIS — G893 Neoplasm related pain (acute) (chronic): Secondary | ICD-10-CM

## 2012-07-08 DIAGNOSIS — F411 Generalized anxiety disorder: Secondary | ICD-10-CM

## 2012-07-08 DIAGNOSIS — R0902 Hypoxemia: Secondary | ICD-10-CM

## 2012-07-08 LAB — BASIC METABOLIC PANEL
CO2: 34 mEq/L — ABNORMAL HIGH (ref 19–32)
Calcium: 8.8 mg/dL (ref 8.4–10.5)
Chloride: 99 mEq/L (ref 96–112)
Potassium: 4.5 mEq/L (ref 3.5–5.1)
Sodium: 136 mEq/L (ref 135–145)

## 2012-07-08 LAB — CBC
Hemoglobin: 9.7 g/dL — ABNORMAL LOW (ref 12.0–15.0)
MCH: 30.9 pg (ref 26.0–34.0)
Platelets: 203 10*3/uL (ref 150–400)

## 2012-07-08 LAB — MAGNESIUM: Magnesium: 2 mg/dL (ref 1.5–2.5)

## 2012-07-08 LAB — PHOSPHORUS: Phosphorus: 3.1 mg/dL (ref 2.3–4.6)

## 2012-07-08 LAB — GLUCOSE, CAPILLARY: Glucose-Capillary: 110 mg/dL — ABNORMAL HIGH (ref 70–99)

## 2012-07-08 MED ORDER — FAMOTIDINE 10 MG PO TABS: ORAL_TABLET | ORAL | Status: DC

## 2012-07-08 MED ORDER — BIOTENE DRY MOUTH MT LIQD
15.0000 mL | Freq: Two times a day (BID) | OROMUCOSAL | Status: DC
Start: 2012-07-08 — End: 2012-07-08
  Administered 2012-07-08: 15 mL via OROMUCOSAL

## 2012-07-08 MED ORDER — DIPHENHYDRAMINE HCL 25 MG PO TABS: 25.0000 mg | ORAL_TABLET | Freq: Four times a day (QID) | ORAL | Status: DC | PRN

## 2012-07-08 MED ORDER — DEXAMETHASONE 4 MG PO TABS: ORAL_TABLET | ORAL | Status: DC

## 2012-07-08 NOTE — Telephone Encounter (Signed)
Called pt and left message regarding appt for Friday 5/23 and rest of May 2014 advised pt to get calendar on 5/23

## 2012-07-08 NOTE — Progress Notes (Signed)
IP PROGRESS NOTE  Subjective:   She feels better. She reports her breathing is at baseline.  Objective: Vital signs in last 24 hours: Blood pressure 114/57, pulse 102, temperature 97.8 F (36.6 C), temperature source Axillary, resp. rate 21, height 5\' 4"  (1.626 m), weight 119 lb 4.3 oz (54.1 kg), SpO2 96.00%.  Intake/Output from previous day: 05/21 0701 - 05/22 0700 In: 580 [P.O.:480; IV Piggyback:100] Out: 2025 [Urine:2025]  Physical Exam:  HEENT: No thrush Lungs: Distant breath sounds, no wheezing, no respiratory distress Cardiac: Regular rate and rhythm Abdomen: Soft and nontender Extremities: No leg edema Skin: No hives   Lab Results:  Recent Labs  07/07/12 0807 07/08/12 0525  WBC 8.0 5.4  HGB 11.7 9.7*  HCT 37.7 31.6*  PLT 259 Large platelets present 203    BMET  Recent Labs  07/07/12 1235 07/08/12 0525  NA 138 136  K 4.2 4.5  CL 100 99  CO2 31 34*  GLUCOSE 160* 130*  BUN 7 12  CREATININE 0.53 0.48*  CALCIUM 8.6 8.8    Studies/Results: Dg Chest Port 1 View  07/07/2012   *RADIOLOGY REPORT*  Clinical Data: Anaphylaxis.  PORTABLE CHEST - 1 VIEW  Comparison: Chest x-ray dated 11/26/2011  Findings: The heart size is normal.  Slight pulmonary vascular prominence consistent with the supine position.  Lungs are hyperinflated consistent with COPD.  No acute infiltrates or effusions.  No pneumothorax.  No acute osseous abnormality.  IMPRESSION: No acute abnormality.  COPD.   Original Report Authenticated By: Francene Boyers, M.D.    Medications: I have reviewed the patient's current medications.  Assessment/Plan:  1.Vulvar/Anal cancer, locally advanced, staging PET scan revealed hypermetabolic circumferential thickening at the distal rectum/anus, bilateral hypermetabolic inguinal nodes and no evidence of distant metastatic disease.  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 Pain secondary  to the locally advanced anal cancer -improved  4. COPD-oxygen dependent  5. Hyperplastic polyps on colonoscopy 05/31/2012  6. Nausea following cisplatin chemotherapy-we will add prophylactic Decadron following chemotherapy 7. Allergic reaction with respiratory failure following Emend premedication on 07/07/2012-the respiratory failure improved with Solu-Medrol, bronchodilator therapy, and epinephrine.   Her respiratory status appears at baseline today. She appears stable for discharge to home. We will schedule week #2 of cisplatin chemotherapy for 07/09/2012. The chemotherapy premedications will be adjusted.      LOS: 1 day   Sherry Bautista  07/08/2012, 9:21 AM

## 2012-07-08 NOTE — Telephone Encounter (Signed)
Pls call pt and arrange hospital follow up in 1 week.

## 2012-07-08 NOTE — Discharge Summary (Signed)
Physician Discharge Summary     Patient ID: Sherry Bautista MRN: [id number] DOB/AGE: 55-Jun-1957 55 y.o.  Admit date: 07/07/2012 Discharge date: 07/08/2012  Admission Diagnoses: Acute on Chronic Respiratory failure   Discharge Diagnoses:  Active Problems:   Tobacco use disorder   COPD Gold C   Acute-on-chronic respiratory failure   Significant Hospital tests/ studies/ interventions and procedures     BRIEF PATIENT DESCRIPTION:  57 yof f/b PW for chronic resp failure/ GOLD D, COPD (o2 3-4 liters/ still active smoker). Admitted on 5/21 from cancer center, w/ acute resp failure while receiving her first dose of Emend as a pre-med prior to chemo.   Hospital Course:  Acute on chronic respiratory failure due to acute bronchospasm.  This was transient. Seemingly a reaction to Emend (first time receiving this med as a pre-chemo agent). She was treated in the usual fashion. In the ER/cancer center-->she received epi-pen, nebs, solumedrol, and NIPPV. She rapidly improved and was able to come off the non-invasive vent even before she left the ER and she was clear of bronchospasm. We then admitted her to the SDU, continued systemic steroids and H2 blockade. Her evening was without further issue so she was clear for d/c on 5/22 Plan:  Rapid pred taper  H2 blockade -->cont Pepcid and PRN benadryl.  Emend has been listed as an allergy in her medical records.  F/u our office next week-->WIll get CXR, had Right basilar atelectasis which was likey from hypoventilation but want to be sure this has cleared.   Chronic respiratory failure in setting of GOLD stage D COPD  - no pft's on file in emr  O2 dependent 3-4 liters  Still active smoker/ active tobacco use  Plan:  Continue her maintenance LABA/ICS and spiriva  BIPAP and SABA, PRN  Continue oxygen  Needs to stop smoking   Acute encephalopathy: almost certainly related to Hypercarbia which was likely exacerbated by high flow supplemental  oxygen and is now resolved.   Vulvar/Anal cancer  locally advanced. She has completed one week of radiation and cisplatin.  Plan:  No acute intervention   Discharge Exam: BP 114/57  Pulse 102  Temp(Src) 97.8 F (36.6 C) (Axillary)  Resp 21  Ht 5\' 4"  (1.626 m)  Wt 54.1 kg (119 lb 4.3 oz)  BMI 20.46 kg/m2  SpO2 95% 2 liters  PHYSICAL EXAMINATION:  General: eldelry wf >>stated age female, in no acute distress.  Neuro: Fully awake and oriented. No focal def  HEENT: [place], no stridor, no JVD  Cardiovascular: rrr  Lungs: Clear/no wheeze  Abdomen: Soft, non-tender + bowel sounds  Musculoskeletal: Intact/ 2+ pulses  Skin: Intact, warm and dry with mild erythema LUE diffuse    Labs at discharge Lab Results  Component Value Date   CREATININE 0.48* 07/08/2012   BUN 12 07/08/2012   NA 136 07/08/2012   K 4.5 07/08/2012   CL 99 07/08/2012   CO2 34* 07/08/2012   Lab Results  Component Value Date   WBC 5.4 07/08/2012   HGB 9.7* 07/08/2012   HCT 31.6* 07/08/2012   MCV 100.6* 07/08/2012   PLT 203 07/08/2012   Lab Results  Component Value Date   ALT 12 06/15/2012   AST 15 06/15/2012   ALKPHOS 112 06/15/2012   BILITOT 0.42 06/15/2012   Lab Results  Component Value Date   INR 1.1* 03/01/2010    Current radiology studies Dg Chest Port 1 View  07/07/2012   *RADIOLOGY REPORT*  Clinical Data: Anaphylaxis.  PORTABLE CHEST - 1 VIEW  Comparison: Chest x-ray dated 11/26/2011  Findings: The heart size is normal.  Slight pulmonary vascular prominence consistent with the supine position.  Lungs are hyperinflated consistent with COPD.  No acute infiltrates or effusions.  No pneumothorax.  No acute osseous abnormality.  IMPRESSION: No acute abnormality.  COPD.   Original Report Authenticated By: Francene Boyers, M.D.    Disposition:  01-Home or Self Care      Discharge Orders   Future Appointments Provider Department Dept Phone   07/08/2012 4:25 PM [facility]  RADIATION ONCOLOGY [phone]   07/13/2012 10:00 AM [facility] MEDICAL ONCOLOGY [phone]   07/13/2012 4:25 PM [facility] RADIATION ONCOLOGY [phone]   07/14/2012 4:25 PM [facility] RADIATION ONCOLOGY [phone]   07/15/2012 9:00 AM [name], MD [facility] [place] [phone]   07/15/2012 4:25 PM [facility] RADIATION ONCOLOGY [phone]   07/16/2012 11:45 AM Julio Sicks, NP [facility] [phone]   07/16/2012 4:25 PM [facility] RADIATION ONCOLOGY [phone]   07/19/2012 8:00 AM Beverely Pace [facility] MEDICAL ONCOLOGY [phone]   07/19/2012 8:30 AM [name], MD [facility] MEDICAL ONCOLOGY [phone]   07/19/2012 9:30 AM [facility] MEDICAL ONCOLOGY [phone]   07/19/2012 4:25 PM [facility] RADIATION ONCOLOGY [phone]   07/20/2012 4:25 PM [facility] RADIATION ONCOLOGY [phone]   07/21/2012 4:25 PM [facility] RADIATION ONCOLOGY [phone]   07/22/2012 4:25 PM [facility] RADIATION ONCOLOGY [phone]   07/23/2012 4:25 PM [facility] RADIATION ONCOLOGY [phone]   07/26/2012 4:25 PM [facility] RADIATION ONCOLOGY [phone]   07/27/2012 4:25 PM [facility] RADIATION ONCOLOGY [phone]   07/28/2012 4:25 PM [facility] RADIATION ONCOLOGY [phone]   07/29/2012 4:25 PM [facility] RADIATION ONCOLOGY [phone]   07/30/2012 4:25 PM [facility] RADIATION ONCOLOGY  [phone]   08/02/2012 4:25 PM [facility] RADIATION ONCOLOGY [phone]   08/03/2012 4:25 PM [facility] RADIATION ONCOLOGY [phone]   08/04/2012 4:25 PM [facility] RADIATION ONCOLOGY [phone]   08/05/2012 4:25 PM [facility] RADIATION ONCOLOGY [phone]   08/06/2012 4:25 PM [facility] RADIATION ONCOLOGY [phone]   08/09/2012 4:25 PM [facility] RADIATION ONCOLOGY [phone]   08/10/2012 2:30 PM Laurette Schimke, MD [facility] GYNECOLOGICAL ONCOLOGY [phone]   08/10/2012 4:25 PM [facility] RADIATION ONCOLOGY [phone]   08/11/2012 4:25 PM [facility] RADIATION ONCOLOGY [phone]   08/12/2012 4:25 PM [facility] RADIATION ONCOLOGY [phone]   08/13/2012 4:25 PM [facility] RADIATION ONCOLOGY [phone]   Future Orders Complete By Expires     Diet - low sodium heart healthy  As directed     Discharge instructions  As directed  Comments:      Re decadron: Take twice a day and continue on day 1 and 2 after chemo. Then decrease to 4mg /day for 2 days, then 1/2 tab for 2 days and then resume typical post-chemo regimen    Increase activity slowly  As directed         Medication List    TAKE these medications       albuterol 108 (90 BASE) MCG/ACT inhaler  Commonly known as:  PROVENTIL HFA;VENTOLIN HFA  Inhale 2 puffs into the lungs every 4 (four) hours as needed for wheezing or shortness of breath.     ALPRAZolam 0.25 MG tablet  Commonly known as:  XANAX  Take 0.25 mg by mouth daily as needed for anxiety.     AMBULATORY NON FORMULARY MEDICATION  Oxygen: 3 liters at rest and 4 liters with activity     buPROPion  150 MG 12 hr tablet  Commonly known as:  WELLBUTRIN SR  Take 1 tablet (150 mg total) by mouth 2 (two) times daily.     dexamethasone 4 MG tablet  Commonly known as:  DECADRON  Take twice a day and continue on day 1 and 2 after chemo. Then decrease to 4mg /day for 2 days, then 1/2 tab for 2 days and then resume typical post-chemo regimen     diphenhydrAMINE 25 MG tablet  Commonly known as:  BENADRYL  Take 1 tablet (25 mg total) by mouth every 6 (six) hours as needed for itching.     DULERA 200-5 MCG/ACT Aero  Generic drug:  mometasone-formoterol  Inhale 2 puffs into the lungs 2 (two) times daily.     famotidine 10 MG tablet  Commonly known as:  PEPCID AC  Take two tabs before bed for next 2 days then stop     ibuprofen 200 MG tablet  Commonly known as:  ADVIL  Take 1-2 tablets (200-400 mg total) by mouth every 6 (six) hours as needed for pain.      ASK your doctor about these medications       oxyCODONE 5 MG immediate release tablet  Commonly known as:  Oxy IR/ROXICODONE  Take 5 mg by mouth every 6 (six) hours as needed for pain.     PARoxetine 25 MG 24 hr tablet  Commonly known as:  PAXIL-CR  Take 1 tablet (25 mg total) by mouth 2 (two) times daily.     PRESCRIPTION MEDICATION  Inject into the vein every 7 (seven) days. Cisplatin, Aloxi and Emend infusion q7d (usually on Mondays)     prochlorperazine 10 MG tablet  Commonly known as:  COMPAZINE  Take 1 tablet (10 mg total) by mouth every 6 (six) hours as needed (nausea).     tiotropium 18 MCG inhalation capsule  Commonly known as:  SPIRIVA  Place 18 mcg into inhaler and inhale daily.       Follow-up Information   Follow up with PARRETT,TAMMY, NP On 07/16/2012. (11am for check-in, followed by CXR and appointment w/ Tammy at 1130)    Contact information:   [address] [place] [address] [phone]       Discharged Condition: good  Physician Statement:   The Patient was personally examined, the  discharge assessment and plan has been personally reviewed and I agree with ACNP Babcock's assessment and plan. > 30 minutes of time have been dedicated to discharge assessment, planning and discharge instructions.   Signed: BABCOCK,PETE 07/08/2012, 8:45 AM  Reviewed above, examined pt, and  agree.  Coralyn Helling, MD [facility] Pulmonary/Critical Care 07/08/2012, 9:26 AM Pager:  [phone] After 3pm call: [phone]

## 2012-07-08 NOTE — Telephone Encounter (Signed)
Called the floor ICU spoke with RN Misty Stanley, informed her we could get patient for radiation at noon, know patient is being d/c today, if she is d/c before her appt time goes back to 430pm today, Stacey,RN will speak with Charge RN and call us back to let us know 9:32 AM

## 2012-07-09 ENCOUNTER — Ambulatory Visit (HOSPITAL_BASED_OUTPATIENT_CLINIC_OR_DEPARTMENT_OTHER): Payer: BC Managed Care – PPO

## 2012-07-09 ENCOUNTER — Ambulatory Visit
Admission: RE | Admit: 2012-07-09 | Discharge: 2012-07-09 | Disposition: A | Discharge status: Home / Self Care | Payer: BC Managed Care – PPO | Source: Ambulatory Visit | Attending: Radiation Oncology | Admitting: Radiation Oncology

## 2012-07-09 ENCOUNTER — Other Ambulatory Visit: Payer: Self-pay | Admitting: Oncology

## 2012-07-09 VITALS — BP 116/68 | HR 92 | Temp 98.5°F

## 2012-07-09 DIAGNOSIS — C519 Malignant neoplasm of vulva, unspecified: Secondary | ICD-10-CM

## 2012-07-09 DIAGNOSIS — C801 Malignant (primary) neoplasm, unspecified: Secondary | ICD-10-CM

## 2012-07-09 DIAGNOSIS — Z5111 Encounter for antineoplastic chemotherapy: Secondary | ICD-10-CM

## 2012-07-09 DIAGNOSIS — C210 Malignant neoplasm of anus, unspecified: Secondary | ICD-10-CM

## 2012-07-09 MED ORDER — POTASSIUM CHLORIDE 2 MEQ/ML IV SOLN
Freq: Once | INTRAVENOUS | Status: AC
  Administered 2012-07-09: 12:00:00 via INTRAVENOUS
  Filled 2012-07-09: qty 10

## 2012-07-09 MED ORDER — DEXAMETHASONE SODIUM PHOSPHATE 20 MG/5ML IJ SOLN
20.0000 mg | Freq: Once | INTRAMUSCULAR | Status: AC
  Administered 2012-07-09: 20 mg via INTRAVENOUS

## 2012-07-09 MED ORDER — CISPLATIN CHEMO INJECTION 100MG/100ML
39.0000 mg/m2 | Freq: Once | INTRAVENOUS | Status: AC
  Administered 2012-07-09: 60 mg via INTRAVENOUS
  Filled 2012-07-09: qty 60

## 2012-07-09 MED ORDER — SODIUM CHLORIDE 0.9 % IV SOLN
Freq: Once | INTRAVENOUS | Status: AC
  Administered 2012-07-09: 12:00:00 via INTRAVENOUS

## 2012-07-09 MED ORDER — ONDANSETRON 16 MG/50ML IVPB (CHCC)
16.0000 mg | Freq: Once | INTRAVENOUS | Status: AC
  Administered 2012-07-09: 16 mg via INTRAVENOUS

## 2012-07-09 NOTE — Telephone Encounter (Signed)
I tried to schedule appointment but a warning caption comes up saying that patient has been dismissed from our practice.

## 2012-07-09 NOTE — Telephone Encounter (Signed)
I will check

## 2012-07-09 NOTE — Telephone Encounter (Signed)
I confirmed that the pt has been dismissed from [facility] primary care.

## 2012-07-09 NOTE — Progress Notes (Signed)
Department of Radiation Oncology  Phone:  [phone] Fax:        [phone]  Weekly Treatment Note    Name: Sherry Bautista Date: 07/09/2012 MRN: [id number] DOB: 1955-02-19   Current dose: 10.8 Gy  Current fraction: 6   MEDICATIONS: Current Outpatient Prescriptions  Medication Sig Dispense Refill  . albuterol (PROVENTIL HFA;VENTOLIN HFA) 108 (90 BASE) MCG/ACT inhaler Inhale 2 puffs into the lungs every 4 (four) hours as needed for wheezing or shortness of breath.      . ALPRAZolam (XANAX) 0.25 MG tablet Take 0.25 mg by mouth daily as needed for anxiety.      . AMBULATORY NON FORMULARY MEDICATION Oxygen: 3 liters at rest and 4 liters with activity       . buPROPion (WELLBUTRIN SR) 150 MG 12 hr tablet Take 1 tablet (150 mg total) by mouth 2 (two) times daily.  60 tablet  2  . dexamethasone (DECADRON) 4 MG tablet Take twice a day and continue on day 1 and 2 after chemo. Then decrease to 4mg /day for 2 days, then 1/2 tab for 2 days and then resume typical post-chemo regimen      . diphenhydrAMINE (BENADRYL) 25 MG tablet Take 1 tablet (25 mg total) by mouth every 6 (six) hours as needed for itching.  30 tablet  0  . famotidine (PEPCID AC) 10 MG tablet Take two tabs before bed for next 2 days then stop      . ibuprofen (ADVIL) 200 MG tablet Take 1-2 tablets (200-400 mg total) by mouth every 6 (six) hours as needed for pain.  30 tablet  0  . mometasone-formoterol (DULERA) 200-5 MCG/ACT AERO Inhale 2 puffs into the lungs 2 (two) times daily.      Marland Kitchen oxyCODONE (OXY IR/ROXICODONE) 5 MG immediate release tablet Take 5 mg by mouth every 6 (six) hours as needed for pain.      Marland Kitchen PARoxetine (PAXIL-CR) 25 MG 24 hr tablet Take 1 tablet (25 mg total) by mouth 2 (two) times daily.  60 tablet  3  . PRESCRIPTION MEDICATION Inject into the vein every 7 (seven) days. Cisplatin, Aloxi and Emend infusion q7d (usually on Mondays)      . prochlorperazine (COMPAZINE) 10 MG tablet Take 1 tablet (10 mg total)  by mouth every 6 (six) hours as needed (nausea).  60 tablet  1  . tiotropium (SPIRIVA) 18 MCG inhalation capsule Place 18 mcg into inhaler and inhale daily.       No current facility-administered medications for this encounter.     ALLERGIES: Azithromycin; Emend; Tetanus-diphtheria toxoids td; and Codeine sulfate   LABORATORY DATA:  Lab Results  Component Value Date   WBC 5.4 07/08/2012   HGB 9.7* 07/08/2012   HCT 31.6* 07/08/2012   MCV 100.6* 07/08/2012   PLT 203 07/08/2012   Lab Results  Component Value Date   NA 136 07/08/2012   K 4.5 07/08/2012   CL 99 07/08/2012   CO2 34* 07/08/2012   Lab Results  Component Value Date   ALT 12 06/15/2012   AST 15 06/15/2012   ALKPHOS 112 06/15/2012   BILITOT 0.42 06/15/2012     NARRATIVE: Sherry Bautista was seen today for weekly treatment management. The chart was checked and the patient's films were reviewed. The patient states that she is doing much better. Feeling much better after her allergic reaction earlier. Also diarrhea is much improved.  PHYSICAL EXAMINATION:  Alert, in no acute distress.  ASSESSMENT: The patient is doing satisfactorily with treatment.  PLAN: We will continue with the patient's radiation treatment as planned.

## 2012-07-13 ENCOUNTER — Telehealth: Payer: Self-pay | Admitting: *Deleted

## 2012-07-13 ENCOUNTER — Telehealth: Payer: Self-pay | Admitting: Oncology

## 2012-07-13 ENCOUNTER — Ambulatory Visit: Payer: BC Managed Care – PPO

## 2012-07-13 ENCOUNTER — Ambulatory Visit
Admission: RE | Admit: 2012-07-13 | Discharge: 2012-07-13 | Disposition: A | Discharge status: Home / Self Care | Payer: BC Managed Care – PPO | Source: Ambulatory Visit | Attending: Radiation Oncology | Admitting: Radiation Oncology

## 2012-07-13 NOTE — Telephone Encounter (Signed)
Per called and moved her appt from 6/2 to 6/5 for chemo. Patient wants to try and also move her MD/lab appt. Patient request that either her MD/lab appt is closer to there rad onc treatment on 6/2 or moved to 6/5 also. DEsk RN notified.  JMW

## 2012-07-13 NOTE — Telephone Encounter (Signed)
Pt left message, called pt back left message to call us back today today

## 2012-07-14 ENCOUNTER — Ambulatory Visit: Payer: BC Managed Care – PPO

## 2012-07-15 ENCOUNTER — Ambulatory Visit: Payer: BC Managed Care – PPO

## 2012-07-15 ENCOUNTER — Telehealth: Payer: Self-pay | Admitting: Oncology

## 2012-07-15 ENCOUNTER — Encounter: Payer: Self-pay | Admitting: Critical Care Medicine

## 2012-07-15 ENCOUNTER — Ambulatory Visit (INDEPENDENT_AMBULATORY_CARE_PROVIDER_SITE_OTHER): Payer: BC Managed Care – PPO | Admitting: Critical Care Medicine

## 2012-07-15 VITALS — BP 136/68 | HR 110 | Temp 98.4°F | Ht 64.0 in | Wt 112.0 lb

## 2012-07-15 DIAGNOSIS — J449 Chronic obstructive pulmonary disease, unspecified: Secondary | ICD-10-CM

## 2012-07-15 DIAGNOSIS — F17200 Nicotine dependence, unspecified, uncomplicated: Secondary | ICD-10-CM

## 2012-07-15 MED ORDER — TIOTROPIUM BROMIDE MONOHYDRATE 18 MCG IN CAPS: 18.0000 ug | ORAL_CAPSULE | Freq: Every day | RESPIRATORY_TRACT | Status: AC

## 2012-07-15 MED ORDER — MOMETASONE FURO-FORMOTEROL FUM 200-5 MCG/ACT IN AERO: 2.0000 | INHALATION_SPRAY | Freq: Two times a day (BID) | RESPIRATORY_TRACT | Status: AC

## 2012-07-15 NOTE — Assessment & Plan Note (Signed)
Still active smoking 07/15/2012 Smoking cessation counseling again given to the pt >32min Failed chantix/welbutrin/nicorettes

## 2012-07-15 NOTE — Progress Notes (Signed)
Subjective:    Patient ID: Sherry Bautista, female    DOB: Sep 17, 1955, 57 y.o.   MRN: [id number]  HPI  57 y.o. .WF  With known hx Dx of copd, active smoker -O2 dependent   07/15/2012 Just in hosp 5/21 for reaction to pre chemorx  Med Emend Now is better. Less dyspnea. Less cough, no chest pain Pt denies any significant sore throat, nasal congestion or excess secretions, fever, chills, sweats, unintended weight loss, pleurtic or exertional chest pain, orthopnea PND, or leg swelling Pt denies any increase in rescue therapy over baseline, denies waking up needing it or having any early am or nocturnal exacerbations of coughing/wheezing/or dyspnea. Pt also denies any obvious fluctuation in symptoms with  weather or environmental change or other alleviating or aggravating factors     Review of Systems  Constitutional:   No  weight loss, night sweats,  Fevers, chills, fatigue, lassitude. HEENT:   No headaches,  Difficulty swallowing,  Tooth/dental problems,  Sore throat,                No sneezing, itching, ear ache, nasal congestion, post nasal drip,   CV:  No chest pain,  Orthopnea, PND, swelling in lower extremities, anasarca, dizziness, palpitations  GI  No heartburn, indigestion, abdominal pain, nausea, vomiting, diarrhea, change in bowel habits, loss of appetite  Resp: ++ shortness of breath with exertion not  at rest.  No excess mucus, no productive cough,  +++ non-productive cough,  No coughing up of blood.  No change in color of mucus.  No wheezing.  No chest wall deformity  Skin: no rash or lesions.  GU: no dysuria, change in color of urine, no urgency or frequency.  No flank pain.  MS:  No joint pain or swelling.  No decreased range of motion.  No back pain.  Psych:  No change in mood or affect. No depression or anxiety.  No memory loss.     Objective:   Physical Exam  Filed Vitals:   07/15/12 0900  BP: 136/68  Pulse: 110  Temp: 98.4 F (36.9 C)  TempSrc: Oral   Height: 5\' 4"  (1.626 m)  Weight: 112 lb (50.803 kg)  SpO2: 97%    Gen: Pleasant, well-nourished, in no distress,  normal affect  ENT: No lesions,  mouth clear,  oropharynx clear, no postnasal drip  Neck: No JVD, no TMG, no carotid bruits  Lungs: No use of accessory muscles, no dullness to percussion, distant breath sounds   Cardiovascular: RRR, heart sounds normal, no murmur or gallops, no peripheral edema  Abdomen: soft and NT, no HSM,  BS normal  Musculoskeletal: No deformities, no cyanosis or clubbing  Neuro: alert, non focal  Skin: Warm, no lesions or rashes  No results found.        Assessment & Plan:   Tobacco use disorder Still active smoking 07/15/2012 Smoking cessation counseling again given to the pt >5min Failed chantix/welbutrin/nicorettes   COPD Gold C Gold C copd oxygen dependent, still smoking Plan Cont dulera Smoking cessation     Updated Medication List Outpatient Encounter Prescriptions as of 07/15/2012  Medication Sig Dispense Refill  . albuterol (PROVENTIL HFA;VENTOLIN HFA) 108 (90 BASE) MCG/ACT inhaler Inhale 2 puffs into the lungs every 4 (four) hours as needed for wheezing or shortness of breath.      . ALPRAZolam (XANAX) 0.25 MG tablet Take 0.25 mg by mouth daily as needed for anxiety.      . AMBULATORY NON FORMULARY  MEDICATION Oxygen: 3 liters at rest and 4 liters with activity       . buPROPion (WELLBUTRIN SR) 150 MG 12 hr tablet Take 1 tablet (150 mg total) by mouth 2 (two) times daily.  60 tablet  2  . dexamethasone (DECADRON) 4 MG tablet Take twice a day and continue on day 1 and 2 after chemo. Then decrease to 4mg /day for 2 days, then 1/2 tab for 2 days and then resume typical post-chemo regimen      . diphenhydrAMINE (BENADRYL) 25 MG tablet Take 1 tablet (25 mg total) by mouth every 6 (six) hours as needed for itching.  30 tablet  0  . famotidine (PEPCID AC) 10 MG tablet Take two tabs before bed for next 2 days then stop      .  ibuprofen (ADVIL) 200 MG tablet Take 1-2 tablets (200-400 mg total) by mouth every 6 (six) hours as needed for pain.  30 tablet  0  . mometasone-formoterol (DULERA) 200-5 MCG/ACT AERO Inhale 2 puffs into the lungs 2 (two) times daily.  1 Inhaler  11  . oxyCODONE (OXY IR/ROXICODONE) 5 MG immediate release tablet Take 5 mg by mouth every 6 (six) hours as needed for pain.      Marland Kitchen PARoxetine (PAXIL-CR) 25 MG 24 hr tablet Take 1 tablet (25 mg total) by mouth 2 (two) times daily.  60 tablet  3  . PRESCRIPTION MEDICATION Inject into the vein every 7 (seven) days. Cisplatin, Aloxi and Emend infusion q7d (usually on Mondays)      . prochlorperazine (COMPAZINE) 10 MG tablet Take 1 tablet (10 mg total) by mouth every 6 (six) hours as needed (nausea).  60 tablet  1  . tiotropium (SPIRIVA) 18 MCG inhalation capsule Place 1 capsule (18 mcg total) into inhaler and inhale daily.  30 capsule  11  . [DISCONTINUED] mometasone-formoterol (DULERA) 200-5 MCG/ACT AERO Inhale 2 puffs into the lungs 2 (two) times daily.      . [DISCONTINUED] tiotropium (SPIRIVA) 18 MCG inhalation capsule Place 18 mcg into inhaler and inhale daily.       No facility-administered encounter medications on file as of 07/15/2012.

## 2012-07-15 NOTE — Assessment & Plan Note (Signed)
Gold C copd oxygen dependent, still smoking Plan Cont dulera Smoking cessation

## 2012-07-15 NOTE — Telephone Encounter (Signed)
Called Sherry Bautista regarding missing two treatments due to diarrhea.  She stated she has taken two doses of Kaopectate to try to stop the diarrhea.  Recommended that she take Imodium if the Kaopectate does not work.  She stated she will be coming for treatment tomorrow.  Advised her to call if the Imodium did not work.  Notified Dr. Mitzi Hansen.

## 2012-07-15 NOTE — Patient Instructions (Addendum)
Refills sent on dulera /spiriva No change in medications Focus on smoking cessation REturn 4 months

## 2012-07-16 ENCOUNTER — Ambulatory Visit
Admission: RE | Admit: 2012-07-16 | Discharge: 2012-07-16 | Disposition: A | Discharge status: Home / Self Care | Payer: BC Managed Care – PPO | Source: Ambulatory Visit | Attending: Radiation Oncology | Admitting: Radiation Oncology

## 2012-07-16 ENCOUNTER — Other Ambulatory Visit (HOSPITAL_BASED_OUTPATIENT_CLINIC_OR_DEPARTMENT_OTHER): Payer: BC Managed Care – PPO

## 2012-07-16 ENCOUNTER — Ambulatory Visit: Payer: BC Managed Care – PPO

## 2012-07-16 ENCOUNTER — Inpatient Hospital Stay: Payer: BC Managed Care – PPO | Admitting: Adult Health

## 2012-07-16 ENCOUNTER — Other Ambulatory Visit: Payer: Self-pay | Admitting: Oncology

## 2012-07-16 ENCOUNTER — Ambulatory Visit (HOSPITAL_BASED_OUTPATIENT_CLINIC_OR_DEPARTMENT_OTHER): Payer: BC Managed Care – PPO

## 2012-07-16 VITALS — BP 125/68 | HR 97 | Temp 98.4°F | Ht 64.0 in | Wt 114.1 lb

## 2012-07-16 VITALS — BP 152/68 | HR 113 | Temp 98.4°F | Resp 18

## 2012-07-16 DIAGNOSIS — C519 Malignant neoplasm of vulva, unspecified: Secondary | ICD-10-CM

## 2012-07-16 DIAGNOSIS — C210 Malignant neoplasm of anus, unspecified: Secondary | ICD-10-CM

## 2012-07-16 DIAGNOSIS — C801 Malignant (primary) neoplasm, unspecified: Secondary | ICD-10-CM

## 2012-07-16 DIAGNOSIS — Z5111 Encounter for antineoplastic chemotherapy: Secondary | ICD-10-CM

## 2012-07-16 LAB — CBC WITH DIFFERENTIAL/PLATELET
BASO%: 0.3 % (ref 0.0–2.0)
Eosinophils Absolute: 0 10*3/uL (ref 0.0–0.5)
HCT: 39.5 % (ref 34.8–46.6)
HGB: 12.2 g/dL (ref 11.6–15.9)
MCH: 31.7 pg (ref 25.1–34.0)
MCV: 102.6 fL — ABNORMAL HIGH (ref 79.5–101.0)
MONO#: 0.7 10*3/uL (ref 0.1–0.9)
RBC: 3.85 10*6/uL (ref 3.70–5.45)
RDW: 15.4 % — ABNORMAL HIGH (ref 11.2–14.5)
lymph#: 0.6 10*3/uL — ABNORMAL LOW (ref 0.9–3.3)
nRBC: 0 % (ref 0–0)

## 2012-07-16 LAB — MAGNESIUM (CC13): Magnesium: 2.2 mg/dl (ref 1.5–2.5)

## 2012-07-16 LAB — COMPREHENSIVE METABOLIC PANEL (CC13)
ALT: 13 U/L (ref 0–55)
AST: 12 U/L (ref 5–34)
Albumin: 3.1 g/dL — ABNORMAL LOW (ref 3.5–5.0)
Alkaline Phosphatase: 90 U/L (ref 40–150)
BUN: 9.4 mg/dL (ref 7.0–26.0)
Calcium: 8.9 mg/dL (ref 8.4–10.4)
Chloride: 98 mEq/L (ref 98–107)
Potassium: 4.2 mEq/L (ref 3.5–5.1)
Sodium: 139 mEq/L (ref 136–145)

## 2012-07-16 MED ORDER — SODIUM CHLORIDE 0.9 % IV SOLN
Freq: Once | INTRAVENOUS | Status: AC
  Administered 2012-07-16: 10:00:00 via INTRAVENOUS

## 2012-07-16 MED ORDER — POTASSIUM CHLORIDE 2 MEQ/ML IV SOLN
Freq: Once | INTRAVENOUS | Status: AC
  Administered 2012-07-16: 10:00:00 via INTRAVENOUS
  Filled 2012-07-16: qty 10

## 2012-07-16 MED ORDER — SODIUM CHLORIDE 0.9 % IV SOLN
39.5000 mg/m2 | Freq: Once | INTRAVENOUS | Status: AC
  Administered 2012-07-16: 60 mg via INTRAVENOUS
  Filled 2012-07-16: qty 60

## 2012-07-16 MED ORDER — ONDANSETRON 16 MG/50ML IVPB (CHCC)
16.0000 mg | Freq: Once | INTRAVENOUS | Status: AC
  Administered 2012-07-16: 16 mg via INTRAVENOUS

## 2012-07-16 MED ORDER — DEXAMETHASONE SODIUM PHOSPHATE 20 MG/5ML IJ SOLN
20.0000 mg | Freq: Once | INTRAMUSCULAR | Status: AC
  Administered 2012-07-16: 20 mg via INTRAVENOUS

## 2012-07-16 NOTE — Progress Notes (Signed)
Department of Radiation Oncology  Phone:  [phone] Fax:        [phone]  Weekly Treatment Note    Name: Sherry Bautista Date: 07/16/2012 MRN: [id number] DOB: 08-30-1955   Current dose: 14.4 Gy  Current fraction: 8   MEDICATIONS: Current Outpatient Prescriptions  Medication Sig Dispense Refill  . albuterol (PROVENTIL HFA;VENTOLIN HFA) 108 (90 BASE) MCG/ACT inhaler Inhale 2 puffs into the lungs every 4 (four) hours as needed for wheezing or shortness of breath.      . ALPRAZolam (XANAX) 0.25 MG tablet Take 0.25 mg by mouth daily as needed for anxiety.      . AMBULATORY NON FORMULARY MEDICATION Oxygen: 3 liters at rest and 4 liters with activity       . buPROPion (WELLBUTRIN SR) 150 MG 12 hr tablet Take 1 tablet (150 mg total) by mouth 2 (two) times daily.  60 tablet  2  . dexamethasone (DECADRON) 4 MG tablet Take twice a day and continue on day 1 and 2 after chemo. Then decrease to 4mg /day for 2 days, then 1/2 tab for 2 days and then resume typical post-chemo regimen      . diphenhydrAMINE (BENADRYL) 25 MG tablet Take 1 tablet (25 mg total) by mouth every 6 (six) hours as needed for itching.  30 tablet  0  . famotidine (PEPCID AC) 10 MG tablet Take two tabs before bed for next 2 days then stop      . ibuprofen (ADVIL) 200 MG tablet Take 1-2 tablets (200-400 mg total) by mouth every 6 (six) hours as needed for pain.  30 tablet  0  . mometasone-formoterol (DULERA) 200-5 MCG/ACT AERO Inhale 2 puffs into the lungs 2 (two) times daily.  1 Inhaler  11  . PARoxetine (PAXIL-CR) 25 MG 24 hr tablet Take 1 tablet (25 mg total) by mouth 2 (two) times daily.  60 tablet  3  . PRESCRIPTION MEDICATION Inject into the vein every 7 (seven) days. Cisplatin, Aloxi and Emend infusion q7d (usually on Mondays)      . prochlorperazine (COMPAZINE) 10 MG tablet Take 1 tablet (10 mg total) by mouth every 6 (six) hours as needed (nausea).  60 tablet  1  . tiotropium (SPIRIVA) 18 MCG inhalation capsule  Place 1 capsule (18 mcg total) into inhaler and inhale daily.  30 capsule  11  . oxyCODONE (OXY IR/ROXICODONE) 5 MG immediate release tablet Take 5 mg by mouth every 6 (six) hours as needed for pain.       No current facility-administered medications for this encounter.     ALLERGIES: Azithromycin; Emend; Tetanus-diphtheria toxoids td; and Codeine sulfate   LABORATORY DATA:  Lab Results  Component Value Date   WBC 6.1 07/16/2012   HGB 12.2 07/16/2012   HCT 39.5 07/16/2012   MCV 102.6* 07/16/2012   PLT 185 07/16/2012   Lab Results  Component Value Date   NA 139 07/16/2012   K 4.2 07/16/2012   CL 98 07/16/2012   CO2 33* 07/16/2012   Lab Results  Component Value Date   ALT 13 07/16/2012   AST 12 07/16/2012   ALKPHOS 90 07/16/2012   BILITOT 0.54 07/16/2012     NARRATIVE: Sherry Bautista was seen today for weekly treatment management. The chart was checked and the patient's films were reviewed. The patient is doing well at this time today. She has had some diarrhea. She has taken Kaopectate but will be switching to Imodium for this. No nausea today.  Her nausea medication has been working for her which helped yesterday.  PHYSICAL EXAMINATION: height is 5\' 4"  (1.626 m) and weight is 114 lb 1.6 oz (51.755 kg). Her temperature is 98.4 F (36.9 C). Her blood pressure is 125/68 and her pulse is 97. Her oxygen saturation is 97%.        ASSESSMENT: The patient is doing satisfactorily with treatment.  PLAN: We will continue with the patient's radiation treatment as planned.

## 2012-07-16 NOTE — Patient Instructions (Addendum)
[facility] Discharge Instructions for Patients Receiving Chemotherapy  Today you received the following chemotherapy agent Cisplatin.  To help prevent nausea and vomiting after your treatment, we encourage you to take your nausea medication.  Begin taking your nausea medication as often as prescribed for by Dr Truett Perna.    If you develop nausea and vomiting that is not controlled by your nausea medication, call the clinic. If it is after clinic hours your family physician or the after hours number for the clinic or go to the Emergency Department.   BELOW ARE SYMPTOMS THAT SHOULD BE REPORTED IMMEDIATELY:  *FEVER GREATER THAN 100.5 F  *CHILLS WITH OR WITHOUT FEVER  NAUSEA AND VOMITING THAT IS NOT CONTROLLED WITH YOUR NAUSEA MEDICATION  *UNUSUAL SHORTNESS OF BREATH  *UNUSUAL BRUISING OR BLEEDING  TENDERNESS IN MOUTH AND THROAT WITH OR WITHOUT PRESENCE OF ULCERS  *URINARY PROBLEMS  *BOWEL PROBLEMS  UNUSUAL RASH Items with * indicate a potential emergency and should be followed up as soon as possible.  One of the nurses will contact you 24 hours after your treatment. Please let the nurse know about any problems that you may have experienced. Feel free to call the clinic you have any questions or concerns. The clinic phone number is [phone].   I have been informed and understand all the instructions given to me. I know to contact the clinic, my physician, or go to the Emergency Department if any problems should occur. I do not have any questions at this time, but understand that I may call the clinic during office hours   should I have any questions or need assistance in obtaining follow up care.    __________________________________________  _____________  __________ Signature of Patient or Authorized Representative            Date                   Time    __________________________________________ Nurse's Signature      Cisplatin injection What  is this medicine? CISPLATIN (SIS pla tin) is a chemotherapy drug. It targets fast dividing cells, like cancer cells, and causes these cells to die. This medicine is used to treat many types of cancer like bladder, ovarian, and testicular cancers. This medicine may be used for other purposes; ask your health care provider or pharmacist if you have questions. What should I tell my health care provider before I take this medicine? They need to know if you have any of these conditions: -blood disorders -hearing problems -kidney disease -recent or ongoing radiation therapy -an unusual or allergic reaction to cisplatin, carboplatin, other chemotherapy, other medicines, foods, dyes, or preservatives -pregnant or trying to get pregnant -breast-feeding How should I use this medicine? This drug is given as an infusion into a vein. It is administered in a hospital or clinic by a specially trained health care professional. Talk to your pediatrician regarding the use of this medicine in children. Special care may be needed. Overdosage: If you think you have taken too much of this medicine contact a poison control center or emergency room at once. NOTE: This medicine is only for you. Do not share this medicine with others. What if I miss a dose? It is important not to miss a dose. Call your doctor or health care professional if you are unable to keep an appointment. What may interact with this medicine? -dofetilide -foscarnet -medicines for seizures -medicines to increase blood counts like filgrastim, pegfilgrastim, sargramostim -probenecid -  pyridoxine used with altretamine -rituximab -some antibiotics like amikacin, gentamicin, neomycin, polymyxin B, streptomycin, tobramycin -sulfinpyrazone -vaccines -zalcitabine Talk to your doctor or health care professional before taking any of these medicines: -acetaminophen -aspirin -ibuprofen -ketoprofen -naproxen This list may not describe all possible  interactions. Give your health care provider a list of all the medicines, herbs, non-prescription drugs, or dietary supplements you use. Also tell them if you smoke, drink alcohol, or use illegal drugs. Some items may interact with your medicine. What should I watch for while using this medicine? Your condition will be monitored carefully while you are receiving this medicine. You will need important blood work done while you are taking this medicine. This drug may make you feel generally unwell. This is not uncommon, as chemotherapy can affect healthy cells as well as cancer cells. Report any side effects. Continue your course of treatment even though you feel ill unless your doctor tells you to stop. In some cases, you may be given additional medicines to help with side effects. Follow all directions for their use. Call your doctor or health care professional for advice if you get a fever, chills or sore throat, or other symptoms of a cold or flu. Do not treat yourself. This drug decreases your body's ability to fight infections. Try to avoid being around people who are sick. This medicine may increase your risk to bruise or bleed. Call your doctor or health care professional if you notice any unusual bleeding. Be careful brushing and flossing your teeth or using a toothpick because you may get an infection or bleed more easily. If you have any dental work done, tell your dentist you are receiving this medicine. Avoid taking products that contain aspirin, acetaminophen, ibuprofen, naproxen, or ketoprofen unless instructed by your doctor. These medicines may hide a fever. Do not become pregnant while taking this medicine. Women should inform their doctor if they wish to become pregnant or think they might be pregnant. There is a potential for serious side effects to an unborn child. Talk to your health care professional or pharmacist for more information. Do not breast-feed an infant while taking this  medicine. Drink fluids as directed while you are taking this medicine. This will help protect your kidneys. Call your doctor or health care professional if you get diarrhea. Do not treat yourself. What side effects may I notice from receiving this medicine? Side effects that you should report to your doctor or health care professional as soon as possible: -allergic reactions like skin rash, itching or hives, swelling of the face, lips, or tongue -signs of infection - fever or chills, cough, sore throat, pain or difficulty passing urine -signs of decreased platelets or bleeding - bruising, pinpoint red spots on the skin, black, tarry stools, nosebleeds -signs of decreased red blood cells - unusually weak or tired, fainting spells, lightheadedness -breathing problems -changes in hearing -gout pain -low blood counts - This drug may decrease the number of white blood cells, red blood cells and platelets. You may be at increased risk for infections and bleeding. -nausea and vomiting -pain, swelling, redness or irritation at the injection site -pain, tingling, numbness in the hands or feet -problems with balance, movement -trouble passing urine or change in the amount of urine Side effects that usually do not require medical attention (report to your doctor or health care professional if they continue or are bothersome): -changes in vision -loss of appetite -metallic taste in the mouth or changes in taste This list may  not describe all possible side effects. Call your doctor for medical advice about side effects. You may report side effects to FDA at 1-800-FDA-1088. Where should I keep my medicine? This drug is given in a hospital or clinic and will not be stored at home. NOTE: This sheet is a summary. It may not cover all possible information. If you have questions about this medicine, talk to your doctor, pharmacist, or health care provider.  2012, [employer]. (05/11/2007 2:40:54  PM)

## 2012-07-16 NOTE — Progress Notes (Signed)
Sherry Bautista here for weekly under treat visit.  She has had 8/30 fractions to her anal area.  She denies pain.  She does have some nausea.  She has had 2 bowel movements today.  She had 10 yesterday.  She took Kaopectate yesterday which stopped the diarrhea.  She also has Imodium which she is going to start taking.  She is feeling fatigued.  She does have some irritation in her treatment area and she is using biafine twice a day.

## 2012-07-17 ENCOUNTER — Ambulatory Visit: Payer: BC Managed Care – PPO

## 2012-07-19 ENCOUNTER — Other Ambulatory Visit: Payer: BC Managed Care – PPO | Admitting: Lab

## 2012-07-19 ENCOUNTER — Ambulatory Visit: Payer: BC Managed Care – PPO

## 2012-07-19 ENCOUNTER — Ambulatory Visit: Payer: BC Managed Care – PPO | Admitting: Oncology

## 2012-07-19 ENCOUNTER — Ambulatory Visit: Admission: RE | Admit: 2012-07-19 | Payer: BC Managed Care – PPO | Source: Ambulatory Visit

## 2012-07-19 ENCOUNTER — Other Ambulatory Visit: Payer: Self-pay | Admitting: *Deleted

## 2012-07-20 ENCOUNTER — Telehealth: Payer: Self-pay | Admitting: Oncology

## 2012-07-20 ENCOUNTER — Ambulatory Visit: Payer: BC Managed Care – PPO

## 2012-07-20 NOTE — Telephone Encounter (Signed)
Called and left a message for Sherry Bautista to check on her,  She has canceled her radiation appointment today due to breathing problems.  Advised her to call [phone].

## 2012-07-21 ENCOUNTER — Telehealth: Payer: Self-pay | Admitting: Oncology

## 2012-07-21 ENCOUNTER — Ambulatory Visit: Payer: BC Managed Care – PPO

## 2012-07-21 NOTE — Telephone Encounter (Signed)
Called Sherry Bautista to check on her after she had canceled treatment again today due to breathing issues.  She stated that she is not feeling well because her allergies have been flaring up.  She stated that she does have a proventil inhaler if she needs it.  She said that she will be here for treatment tomorrow.

## 2012-07-22 ENCOUNTER — Ambulatory Visit
Admission: RE | Admit: 2012-07-22 | Discharge: 2012-07-22 | Disposition: A | Discharge status: Home / Self Care | Payer: BC Managed Care – PPO | Source: Ambulatory Visit | Attending: Radiation Oncology | Admitting: Radiation Oncology

## 2012-07-22 ENCOUNTER — Telehealth: Payer: Self-pay | Admitting: *Deleted

## 2012-07-22 ENCOUNTER — Telehealth: Payer: Self-pay | Admitting: Oncology

## 2012-07-22 ENCOUNTER — Ambulatory Visit (HOSPITAL_BASED_OUTPATIENT_CLINIC_OR_DEPARTMENT_OTHER): Payer: BC Managed Care – PPO | Admitting: Oncology

## 2012-07-22 ENCOUNTER — Other Ambulatory Visit (HOSPITAL_BASED_OUTPATIENT_CLINIC_OR_DEPARTMENT_OTHER): Payer: BC Managed Care – PPO | Admitting: Lab

## 2012-07-22 ENCOUNTER — Ambulatory Visit (HOSPITAL_BASED_OUTPATIENT_CLINIC_OR_DEPARTMENT_OTHER): Payer: BC Managed Care – PPO

## 2012-07-22 ENCOUNTER — Other Ambulatory Visit: Payer: Self-pay | Admitting: *Deleted

## 2012-07-22 VITALS — BP 157/73 | HR 90 | Temp 99.1°F | Resp 18 | Ht 64.0 in | Wt 111.7 lb

## 2012-07-22 DIAGNOSIS — K603 Anal fistula: Secondary | ICD-10-CM

## 2012-07-22 DIAGNOSIS — G893 Neoplasm related pain (acute) (chronic): Secondary | ICD-10-CM

## 2012-07-22 DIAGNOSIS — C519 Malignant neoplasm of vulva, unspecified: Secondary | ICD-10-CM

## 2012-07-22 DIAGNOSIS — Z5111 Encounter for antineoplastic chemotherapy: Secondary | ICD-10-CM

## 2012-07-22 DIAGNOSIS — C785 Secondary malignant neoplasm of large intestine and rectum: Secondary | ICD-10-CM

## 2012-07-22 DIAGNOSIS — J449 Chronic obstructive pulmonary disease, unspecified: Secondary | ICD-10-CM

## 2012-07-22 LAB — CBC WITH DIFFERENTIAL/PLATELET
BASO%: 0.5 % (ref 0.0–2.0)
Basophils Absolute: 0 10*3/uL (ref 0.0–0.1)
EOS%: 0.4 % (ref 0.0–7.0)
HGB: 11.5 g/dL — ABNORMAL LOW (ref 11.6–15.9)
MCH: 31.7 pg (ref 25.1–34.0)
MCHC: 31.1 g/dL — ABNORMAL LOW (ref 31.5–36.0)
MCV: 101.9 fL — ABNORMAL HIGH (ref 79.5–101.0)
MONO%: 10.2 % (ref 0.0–14.0)
NEUT%: 83.1 % — ABNORMAL HIGH (ref 38.4–76.8)
RDW: 15.6 % — ABNORMAL HIGH (ref 11.2–14.5)
lymph#: 0.4 10*3/uL — ABNORMAL LOW (ref 0.9–3.3)

## 2012-07-22 LAB — MAGNESIUM (CC13): Magnesium: 2 mg/dl (ref 1.5–2.5)

## 2012-07-22 LAB — BASIC METABOLIC PANEL (CC13)
BUN: 11.4 mg/dL (ref 7.0–26.0)
Calcium: 9.1 mg/dL (ref 8.4–10.4)
Glucose: 104 mg/dl — ABNORMAL HIGH (ref 70–99)

## 2012-07-22 MED ORDER — DEXAMETHASONE SODIUM PHOSPHATE 20 MG/5ML IJ SOLN
20.0000 mg | Freq: Once | INTRAMUSCULAR | Status: AC
  Administered 2012-07-22: 20 mg via INTRAVENOUS

## 2012-07-22 MED ORDER — SODIUM CHLORIDE 0.9 % IV SOLN
39.5000 mg/m2 | Freq: Once | INTRAVENOUS | Status: AC
  Administered 2012-07-22: 60 mg via INTRAVENOUS
  Filled 2012-07-22: qty 60

## 2012-07-22 MED ORDER — ALPRAZOLAM 0.5 MG PO TABS
0.5000 mg | ORAL_TABLET | Freq: Once | ORAL | Status: AC
  Administered 2012-07-22: 0.5 mg via ORAL

## 2012-07-22 MED ORDER — SODIUM CHLORIDE 0.9 % IV SOLN
Freq: Once | INTRAVENOUS | Status: AC
  Administered 2012-07-22: 12:00:00 via INTRAVENOUS

## 2012-07-22 MED ORDER — ONDANSETRON 16 MG/50ML IVPB (CHCC)
16.0000 mg | Freq: Once | INTRAVENOUS | Status: AC
  Administered 2012-07-22: 16 mg via INTRAVENOUS

## 2012-07-22 MED ORDER — POTASSIUM CHLORIDE 2 MEQ/ML IV SOLN
Freq: Once | INTRAVENOUS | Status: AC
  Administered 2012-07-22: 12:00:00 via INTRAVENOUS
  Filled 2012-07-22: qty 10

## 2012-07-22 NOTE — Telephone Encounter (Signed)
Left a message for Sherry Bautista to see if she if coming for her treatment today.  Asked that she call back to let us know at [phone].

## 2012-07-22 NOTE — Telephone Encounter (Signed)
Called pt and left message regarding labs, md and chemo on 6/13 and 08/06/2012

## 2012-07-22 NOTE — Telephone Encounter (Signed)
Per staff message and POF I have scheduled appts.  JMW  

## 2012-07-22 NOTE — Progress Notes (Signed)
Patient asked to hold chemo today after receiving hydration, she was feeling anxious and states she needs to be at home. No acute distress of nausea, vomiting or diarrhea, she says its just her nerves. Patient has Rx for Xanax, however, does not have any with her. Orders rec'd for Xanax to be given here. Patient agreed to stay for her treatment.

## 2012-07-22 NOTE — Progress Notes (Signed)
   [facility]    OFFICE PROGRESS NOTE   INTERVAL HISTORY:   She completed another cycle of cisplatin on 07/16/2012. She reports tolerating the chemotherapy well. No nausea or vomiting. She reports mild "tingling" in the fingers. The anal pain is improved. Occasional diarrhea. She continues to pass stool in the vagina.  Objective:  Vital signs in last 24 hours:  Blood pressure 157/73, pulse 90, temperature 99.1 F (37.3 C), temperature source Oral, resp. rate 18, height 5\' 4"  (1.626 m), weight 111 lb 11.2 oz (50.667 kg), SpO2 98.00%.    HEENT: No thrush or ulcers Lymphatics: No inguinal nodes Resp: Distant breath sounds, scattered rhonchi, no respiratory distress Cardio: Regular rate and rhythm GI: No hepatomegaly, erythema in the groin and at the perineum without skin breakdown. There is dark stool protruding from the labia Vascular: No leg edema   Lab Results:  Lab Results  Component Value Date   WBC 7.4 07/22/2012   HGB 11.5* 07/22/2012   HCT 37.0 07/22/2012   MCV 101.9* 07/22/2012   PLT 144* 07/22/2012   ANC 6.1    Medications: I have reviewed the patient's current medications.  Assessment/Plan: 1.Vulvar/Anal cancer, locally advanced, staging PET scan revealed hypermetabolic circumferential thickening at the distal rectum/anus, bilateral hypermetabolic inguinal nodes and no evidence of distant metastatic disease.  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 Pain secondary to the locally advanced anal cancer -improved  4. COPD-oxygen dependent  5. Hyperplastic polyps on colonoscopy 05/31/2012  6. Nausea following cisplatin chemotherapy-improved with prophylactic Decadron following chemotherapy  7. Allergic reaction with respiratory failure following Emend premedication on 07/07/2012-the respiratory failure improved with Solu-Medrol, bronchodilator therapy, and epinephrine.     Disposition:  She has completed 3 treatments with weekly cisplatin and concurrent radiation. The plan is to proceed with week 4 today. She is scheduled for  week 5 cisplatin on 07/30/2012. She will return for an office visit on 08/06/2012.   Thornton Papas, MD  07/22/2012  11:12 AM

## 2012-07-22 NOTE — Patient Instructions (Addendum)
[facility] Discharge Instructions for Patients Receiving Chemotherapy  Today you received the following chemotherapy agents Cisplatin  To help prevent nausea and vomiting after your treatment, we encourage you to take your nausea medication as needed   If you develop nausea and vomiting that is not controlled by your nausea medication, call the clinic.   BELOW ARE SYMPTOMS THAT SHOULD BE REPORTED IMMEDIATELY:  *FEVER GREATER THAN 100.5 F  *CHILLS WITH OR WITHOUT FEVER  NAUSEA AND VOMITING THAT IS NOT CONTROLLED WITH YOUR NAUSEA MEDICATION  *UNUSUAL SHORTNESS OF BREATH  *UNUSUAL BRUISING OR BLEEDING  TENDERNESS IN MOUTH AND THROAT WITH OR WITHOUT PRESENCE OF ULCERS  *URINARY PROBLEMS  *BOWEL PROBLEMS  UNUSUAL RASH Items with * indicate a potential emergency and should be followed up as soon as possible.  Feel free to call the clinic you have any questions or concerns. The clinic phone number is [phone].    

## 2012-07-23 ENCOUNTER — Ambulatory Visit
Admission: RE | Admit: 2012-07-23 | Discharge: 2012-07-23 | Disposition: A | Discharge status: Home / Self Care | Payer: BC Managed Care – PPO | Source: Ambulatory Visit | Attending: Radiation Oncology | Admitting: Radiation Oncology

## 2012-07-23 ENCOUNTER — Encounter: Payer: Self-pay | Admitting: Radiation Oncology

## 2012-07-23 VITALS — BP 139/64 | HR 121 | Temp 98.2°F | Wt 114.2 lb

## 2012-07-23 DIAGNOSIS — C210 Malignant neoplasm of anus, unspecified: Secondary | ICD-10-CM

## 2012-07-23 MED ORDER — BIAFINE EX EMUL
CUTANEOUS | Status: DC | PRN
  Administered 2012-07-23: 18:00:00 via TOPICAL

## 2012-07-23 NOTE — Progress Notes (Signed)
Ms medico has received 10 fractions to her anal region.  She is c/o of itching and burning in the labial region and rectal area.  She denies any vaginal drainage nor any bleeding when she wipes.  Her pulse is elevated at 121 and 118, but is regular.  She states she has been using her Albuterol inhaler more frequently and know that this makes her pulse more elevated.  Presently using 4 liters per minute since she is active, but when resting she using her O 2 at 3 liters per minute.Sherry Bautista

## 2012-07-23 NOTE — Progress Notes (Signed)
Department of Radiation Oncology  Phone:  [phone] Fax:        [phone]  Weekly Treatment Note    Name: Sherry Bautista Date: 07/23/2012 MRN: [id number] DOB: 1955/04/15   Current dose: 18 Gy  Current fraction: 10   MEDICATIONS: Current Outpatient Prescriptions  Medication Sig Dispense Refill  . albuterol (PROVENTIL HFA;VENTOLIN HFA) 108 (90 BASE) MCG/ACT inhaler Inhale 2 puffs into the lungs every 4 (four) hours as needed for wheezing or shortness of breath.      . ALPRAZolam (XANAX) 0.25 MG tablet Take 0.25 mg by mouth daily as needed for anxiety.      . AMBULATORY NON FORMULARY MEDICATION Oxygen: 3 liters at rest and 4 liters with activity       . buPROPion (WELLBUTRIN SR) 150 MG 12 hr tablet Take 1 tablet (150 mg total) by mouth 2 (two) times daily.  60 tablet  2  . dexamethasone (DECADRON) 4 MG tablet Take twice a day and continue on day 1 and 2 after chemo. Then decrease to 4mg /day for 2 days, then 1/2 tab for 2 days and then resume typical post-chemo regimen      . diphenhydrAMINE (BENADRYL) 25 MG tablet Take 1 tablet (25 mg total) by mouth every 6 (six) hours as needed for itching.  30 tablet  0  . famotidine (PEPCID AC) 10 MG tablet Take two tabs before bed for next 2 days then stop      . ibuprofen (ADVIL) 200 MG tablet Take 1-2 tablets (200-400 mg total) by mouth every 6 (six) hours as needed for pain.  30 tablet  0  . mometasone-formoterol (DULERA) 200-5 MCG/ACT AERO Inhale 2 puffs into the lungs 2 (two) times daily.  1 Inhaler  11  . oxyCODONE (OXY IR/ROXICODONE) 5 MG immediate release tablet Take 5 mg by mouth every 6 (six) hours as needed for pain.      Marland Kitchen PARoxetine (PAXIL-CR) 25 MG 24 hr tablet Take 1 tablet (25 mg total) by mouth 2 (two) times daily.  60 tablet  3  . PRESCRIPTION MEDICATION Inject into the vein every 7 (seven) days. Cisplatin, Aloxi and Emend infusion q7d (usually on Mondays)      . prochlorperazine (COMPAZINE) 10 MG tablet Take 1 tablet (10  mg total) by mouth every 6 (six) hours as needed (nausea).  60 tablet  1  . tiotropium (SPIRIVA) 18 MCG inhalation capsule Place 1 capsule (18 mcg total) into inhaler and inhale daily.  30 capsule  11   Current Facility-Administered Medications  Medication Dose Route Frequency Provider Last Rate Last Dose  . topical emolient (BIAFINE) emulsion   Topical PRN Jonna Coup, MD         ALLERGIES: Azithromycin; Emend; Tetanus-diphtheria toxoids td; and Codeine sulfate   LABORATORY DATA:  Lab Results  Component Value Date   WBC 7.4 07/22/2012   HGB 11.5* 07/22/2012   HCT 37.0 07/22/2012   MCV 101.9* 07/22/2012   PLT 144* 07/22/2012   Lab Results  Component Value Date   NA 139 07/22/2012   K 4.1 07/22/2012   CL 98 07/22/2012   CO2 33* 07/22/2012   Lab Results  Component Value Date   ALT 13 07/16/2012   AST 12 07/16/2012   ALKPHOS 90 07/16/2012   BILITOT 0.54 07/16/2012     NARRATIVE: Sherry Bautista was seen today for weekly treatment management. The chart was checked and the patient's films were reviewed. The patient states that she  is feeling better today. She has had some increased difficulty breathing but this has been improving. She missed a couple of days of treatment earlier this week. No major change in irritation in the rectal/anal region.  PHYSICAL EXAMINATION: weight is 114 lb 3.2 oz (51.801 kg). Her temperature is 98.2 F (36.8 C). Her blood pressure is 139/64 and her pulse is 121. Her oxygen saturation is 100%.        ASSESSMENT: The patient is doing satisfactorily with treatment.  PLAN: We will continue with the patient's radiation treatment as planned. The patient and I had a detailed conversation and which I stressed to her the importance of making every effort possible to comment each day for treatment. I'm worried that we have missed some days already and we still have 4 weeks of treatment remaining. She expressed an understanding of the issues that the course of radiation will  not be as effective if we have a number of breaks current treatment.

## 2012-07-25 ENCOUNTER — Other Ambulatory Visit: Payer: Self-pay | Admitting: Oncology

## 2012-07-26 ENCOUNTER — Telehealth: Payer: Self-pay | Admitting: Oncology

## 2012-07-26 ENCOUNTER — Ambulatory Visit: Payer: BC Managed Care – PPO

## 2012-07-26 NOTE — Telephone Encounter (Signed)
Called pt and left message regarding lab, MD and chemo on 08/06/12

## 2012-07-26 NOTE — Telephone Encounter (Signed)
Called and left a message for Willine Schwalbe regarding canceling her treatment today.  Asked her to call back at [phone].

## 2012-07-27 ENCOUNTER — Ambulatory Visit
Admission: RE | Admit: 2012-07-27 | Discharge: 2012-07-27 | Disposition: A | Discharge status: Home / Self Care | Payer: BC Managed Care – PPO | Source: Ambulatory Visit | Attending: Radiation Oncology | Admitting: Radiation Oncology

## 2012-07-28 ENCOUNTER — Ambulatory Visit: Payer: BC Managed Care – PPO

## 2012-07-29 ENCOUNTER — Ambulatory Visit: Payer: BC Managed Care – PPO

## 2012-07-30 ENCOUNTER — Encounter: Payer: Self-pay | Admitting: Radiation Oncology

## 2012-07-30 ENCOUNTER — Ambulatory Visit
Admission: RE | Admit: 2012-07-30 | Discharge: 2012-07-30 | Disposition: A | Discharge status: Home / Self Care | Payer: BC Managed Care – PPO | Source: Ambulatory Visit | Attending: Radiation Oncology | Admitting: Radiation Oncology

## 2012-07-30 ENCOUNTER — Other Ambulatory Visit (HOSPITAL_BASED_OUTPATIENT_CLINIC_OR_DEPARTMENT_OTHER): Payer: BC Managed Care – PPO

## 2012-07-30 ENCOUNTER — Ambulatory Visit (HOSPITAL_BASED_OUTPATIENT_CLINIC_OR_DEPARTMENT_OTHER): Payer: BC Managed Care – PPO

## 2012-07-30 VITALS — BP 122/68 | HR 89 | Temp 98.3°F | Resp 22 | Wt 115.0 lb

## 2012-07-30 VITALS — BP 119/62 | HR 96 | Temp 98.2°F

## 2012-07-30 DIAGNOSIS — F411 Generalized anxiety disorder: Secondary | ICD-10-CM

## 2012-07-30 DIAGNOSIS — Z5111 Encounter for antineoplastic chemotherapy: Secondary | ICD-10-CM

## 2012-07-30 DIAGNOSIS — C210 Malignant neoplasm of anus, unspecified: Secondary | ICD-10-CM

## 2012-07-30 DIAGNOSIS — C211 Malignant neoplasm of anal canal: Secondary | ICD-10-CM

## 2012-07-30 DIAGNOSIS — C519 Malignant neoplasm of vulva, unspecified: Secondary | ICD-10-CM

## 2012-07-30 LAB — BASIC METABOLIC PANEL (CC13)
BUN: 8.3 mg/dL (ref 7.0–26.0)
Potassium: 4 mEq/L (ref 3.5–5.1)
Sodium: 139 mEq/L (ref 136–145)

## 2012-07-30 LAB — CBC WITH DIFFERENTIAL/PLATELET
BASO%: 0.8 % (ref 0.0–2.0)
EOS%: 1 % (ref 0.0–7.0)
MCH: 33.7 pg (ref 25.1–34.0)
MCHC: 33.9 g/dL (ref 31.5–36.0)
MONO#: 0.5 10*3/uL (ref 0.1–0.9)
RBC: 3.43 10*6/uL — ABNORMAL LOW (ref 3.70–5.45)
RDW: 17 % — ABNORMAL HIGH (ref 11.2–14.5)
WBC: 3.7 10*3/uL — ABNORMAL LOW (ref 3.9–10.3)
lymph#: 0.4 10*3/uL — ABNORMAL LOW (ref 0.9–3.3)

## 2012-07-30 LAB — MAGNESIUM (CC13): Magnesium: 1.9 mg/dl (ref 1.5–2.5)

## 2012-07-30 MED ORDER — ONDANSETRON 16 MG/50ML IVPB (CHCC)
16.0000 mg | Freq: Once | INTRAVENOUS | Status: AC
  Administered 2012-07-30: 16 mg via INTRAVENOUS

## 2012-07-30 MED ORDER — DEXAMETHASONE SODIUM PHOSPHATE 20 MG/5ML IJ SOLN
20.0000 mg | Freq: Once | INTRAMUSCULAR | Status: AC
  Administered 2012-07-30: 20 mg via INTRAVENOUS

## 2012-07-30 MED ORDER — POTASSIUM CHLORIDE 2 MEQ/ML IV SOLN
Freq: Once | INTRAVENOUS | Status: AC
  Administered 2012-07-30: 09:00:00 via INTRAVENOUS
  Filled 2012-07-30: qty 10

## 2012-07-30 MED ORDER — SODIUM CHLORIDE 0.9 % IV SOLN
Freq: Once | INTRAVENOUS | Status: AC
  Administered 2012-07-30: 09:00:00 via INTRAVENOUS

## 2012-07-30 MED ORDER — SODIUM CHLORIDE 0.9 % IV SOLN
39.5000 mg/m2 | Freq: Once | INTRAVENOUS | Status: AC
  Administered 2012-07-30: 60 mg via INTRAVENOUS
  Filled 2012-07-30: qty 60

## 2012-07-30 NOTE — Progress Notes (Signed)
  Radiation Oncology         [phone] ________________________________  Name: Sherry Bautista MRN: [id number]  Date: 07/30/2012  DOB: 12-11-1955  Weekly Radiation Therapy Management  Current Dose: 21.6 Gy     Planned Dose:  54 Gy  Narrative . . . . . . . . The patient presents for routine under treatment assessment.                                                      The patient is without complaint.                                 Set-up films were reviewed.                                 The chart was checked. Physical Findings. . .  weight is 115 lb (52.164 kg). Her oral temperature is 98.3 F (36.8 C). Her blood pressure is 122/68 and her pulse is 89. Her respiration is 22 and oxygen saturation is 95%. . Weight essentially stable.  No significant changes. Impression . . . . . . . The patient is  tolerating radiation. Plan . . . . . . . . . . . . Continue treatment as planned.  ________________________________  Artist Pais. Kathrynn Running, M.D.

## 2012-07-30 NOTE — Progress Notes (Deleted)
Patient weekly rad tx anal 12 completed so far, oxygen on 3 liters=95%,   At rest, increases her oxygen to 4 liters, n/c with activity, States" white blisters in perivaginal area biggest one on lip of labia" erythema, itching, no bleeding or discharge  when she voids or with bowel movements, using sitz baths TID, pain a 4-5, takes her pain meds has IVF's [facility] at [facility]  wrist area, intact, no redness or swelling, missed last 2 days treatment ,

## 2012-07-30 NOTE — Patient Instructions (Signed)
[facility] Discharge Instructions for Patients Receiving Chemotherapy  Today you received the following chemotherapy agents Cisplatin.  To help prevent nausea and vomiting after your treatment, we encourage you to take your nausea medication as prescribed.   If you develop nausea and vomiting that is not controlled by your nausea medication, call the clinic.   BELOW ARE SYMPTOMS THAT SHOULD BE REPORTED IMMEDIATELY:  *FEVER GREATER THAN 100.5 F  *CHILLS WITH OR WITHOUT FEVER  NAUSEA AND VOMITING THAT IS NOT CONTROLLED WITH YOUR NAUSEA MEDICATION  *UNUSUAL SHORTNESS OF BREATH  *UNUSUAL BRUISING OR BLEEDING  TENDERNESS IN MOUTH AND THROAT WITH OR WITHOUT PRESENCE OF ULCERS  *URINARY PROBLEMS  *BOWEL PROBLEMS  UNUSUAL RASH Items with * indicate a potential emergency and should be followed up as soon as possible.  Feel free to call the clinic you have any questions or concerns. The clinic phone number is [phone].    

## 2012-07-30 NOTE — Progress Notes (Addendum)
Weekly rad txs, 12 completed anal, patient has IVF's @[facility]  LFA, intact, no redness or swelling, , pain in vagina area 4-5, states erythema and white blisters ,biggest blister on lip of her labia, she staes she  Can see in the mirror, , uses sitz baths TID, and takes OXY IR 5mg  prn which helps ,pain now 4-5 in that area, no diarrhea, no nausea stated, oxygen 95% on 3 liters, styates she increases to 4 liters oxygen with activity, missed last 2 days rad tx, no bleeding or discharge when voiding or having bowel movements, no dysuria, missed past 2 days for bad h/a 2:06 PM

## 2012-08-02 ENCOUNTER — Ambulatory Visit
Admission: RE | Admit: 2012-08-02 | Discharge: 2012-08-02 | Disposition: A | Discharge status: Home / Self Care | Payer: BC Managed Care – PPO | Source: Ambulatory Visit | Attending: Radiation Oncology | Admitting: Radiation Oncology

## 2012-08-03 ENCOUNTER — Ambulatory Visit: Payer: BC Managed Care – PPO

## 2012-08-04 ENCOUNTER — Ambulatory Visit: Payer: BC Managed Care – PPO

## 2012-08-05 ENCOUNTER — Telehealth: Payer: Self-pay | Admitting: Oncology

## 2012-08-05 ENCOUNTER — Ambulatory Visit: Payer: BC Managed Care – PPO

## 2012-08-05 NOTE — Telephone Encounter (Signed)
Called Sherry Bautista regarding missing treatment for 2 days.  She states that she is feeling better.  She has been having nausea, vomiting and diarrhea.  She has been taking compazine for the nausea and is feeling better.  She will be coming to her treatment tomorrow.

## 2012-08-06 ENCOUNTER — Telehealth: Payer: Self-pay | Admitting: Oncology

## 2012-08-06 ENCOUNTER — Ambulatory Visit (HOSPITAL_BASED_OUTPATIENT_CLINIC_OR_DEPARTMENT_OTHER): Payer: BC Managed Care – PPO | Admitting: Oncology

## 2012-08-06 ENCOUNTER — Ambulatory Visit
Admission: RE | Admit: 2012-08-06 | Discharge: 2012-08-06 | Disposition: A | Discharge status: Home / Self Care | Payer: BC Managed Care – PPO | Source: Ambulatory Visit | Attending: Radiation Oncology | Admitting: Radiation Oncology

## 2012-08-06 ENCOUNTER — Ambulatory Visit: Payer: BC Managed Care – PPO

## 2012-08-06 ENCOUNTER — Other Ambulatory Visit (HOSPITAL_BASED_OUTPATIENT_CLINIC_OR_DEPARTMENT_OTHER): Payer: BC Managed Care – PPO | Admitting: Lab

## 2012-08-06 ENCOUNTER — Ambulatory Visit: Payer: BC Managed Care – PPO | Admitting: Oncology

## 2012-08-06 VITALS — BP 104/66 | HR 102 | Temp 98.1°F | Ht 64.0 in | Wt 110.9 lb

## 2012-08-06 VITALS — BP 121/72 | HR 104 | Temp 97.0°F | Resp 19 | Ht 64.0 in | Wt 110.2 lb

## 2012-08-06 DIAGNOSIS — C519 Malignant neoplasm of vulva, unspecified: Secondary | ICD-10-CM

## 2012-08-06 DIAGNOSIS — C210 Malignant neoplasm of anus, unspecified: Secondary | ICD-10-CM

## 2012-08-06 LAB — CBC WITH DIFFERENTIAL/PLATELET
BASO%: 0.5 % (ref 0.0–2.0)
Basophils Absolute: 0 10*3/uL (ref 0.0–0.1)
EOS%: 0.7 % (ref 0.0–7.0)
HGB: 11 g/dL — ABNORMAL LOW (ref 11.6–15.9)
MCH: 32.5 pg (ref 25.1–34.0)
MCHC: 31.5 g/dL (ref 31.5–36.0)
MONO#: 0.7 10*3/uL (ref 0.1–0.9)
RDW: 17.2 % — ABNORMAL HIGH (ref 11.2–14.5)
WBC: 4.4 10*3/uL (ref 3.9–10.3)
lymph#: 0.4 10*3/uL — ABNORMAL LOW (ref 0.9–3.3)

## 2012-08-06 NOTE — Progress Notes (Signed)
Department of Radiation Oncology  Phone:  [phone] Fax:        [phone]  Weekly Treatment Note    Name: Sherry Bautista Date: 08/06/2012 MRN: [id number] DOB: 23-Mar-1955   Current dose: 25.2 Gy  Current fraction: 14   MEDICATIONS: Current Outpatient Prescriptions  Medication Sig Dispense Refill  . albuterol (PROVENTIL HFA;VENTOLIN HFA) 108 (90 BASE) MCG/ACT inhaler Inhale 2 puffs into the lungs every 4 (four) hours as needed for wheezing or shortness of breath.      . ALPRAZolam (XANAX) 0.25 MG tablet Take 0.25 mg by mouth daily as needed for anxiety.      . AMBULATORY NON FORMULARY MEDICATION Oxygen: 3 liters at rest and 4 liters with activity       . buPROPion (WELLBUTRIN SR) 150 MG 12 hr tablet Take 1 tablet (150 mg total) by mouth 2 (two) times daily.  60 tablet  2  . dexamethasone (DECADRON) 4 MG tablet Take twice a day and continue on day 1 and 2 after chemo. Then decrease to 4mg /day for 2 days, then 1/2 tab for 2 days and then resume typical post-chemo regimen      . diphenhydrAMINE (BENADRYL) 25 MG tablet Take 1 tablet (25 mg total) by mouth every 6 (six) hours as needed for itching.  30 tablet  0  . ibuprofen (ADVIL) 200 MG tablet Take 1-2 tablets (200-400 mg total) by mouth every 6 (six) hours as needed for pain.  30 tablet  0  . loperamide (IMODIUM) 2 MG capsule Take 2 mg by mouth 4 (four) times daily as needed for diarrhea or loose stools.      . mometasone-formoterol (DULERA) 200-5 MCG/ACT AERO Inhale 2 puffs into the lungs 2 (two) times daily.  1 Inhaler  11  . oxyCODONE (OXY IR/ROXICODONE) 5 MG immediate release tablet Take 5 mg by mouth every 6 (six) hours as needed for pain.      Marland Kitchen PARoxetine (PAXIL-CR) 25 MG 24 hr tablet Take 1 tablet (25 mg total) by mouth 2 (two) times daily.  60 tablet  3  . PRESCRIPTION MEDICATION Inject into the vein every 7 (seven) days. Cisplatin, Aloxi and Emend infusion q7d (usually on Mondays)      . prochlorperazine (COMPAZINE)  10 MG tablet Take 1 tablet (10 mg total) by mouth every 6 (six) hours as needed (nausea).  60 tablet  1  . tiotropium (SPIRIVA) 18 MCG inhalation capsule Place 1 capsule (18 mcg total) into inhaler and inhale daily.  30 capsule  11  . famotidine (PEPCID AC) 10 MG tablet Take two tabs before bed for next 2 days then stop       No current facility-administered medications for this encounter.     ALLERGIES: Azithromycin; Emend; Tetanus-diphtheria toxoids td; and Codeine sulfate   LABORATORY DATA:  Lab Results  Component Value Date   WBC 3.7* 07/30/2012   HGB 11.5* 07/30/2012   HCT 34.0* 07/30/2012   MCV 99.4 07/30/2012   PLT 182 07/30/2012   Lab Results  Component Value Date   NA 139 07/30/2012   K 4.0 07/30/2012   CL 97* 07/30/2012   CO2 32* 07/30/2012   Lab Results  Component Value Date   ALT 13 07/16/2012   AST 12 07/16/2012   ALKPHOS 90 07/16/2012   BILITOT 0.54 07/16/2012     NARRATIVE: Sherry Bautista was seen today for weekly treatment management. The chart was checked and the patient's films were reviewed. The patient  has continued to Ms. treatments this week. He states that she had a GI bug. Am very concerned about this issue and we have discussed this previously. We had a Homero Fellers conversation today about the potential impact of Ms. treatments on the expected success rate. She is noticing some skin irritation and has been given a sitz bath today. She also has skin cream which she will continue to use.  PHYSICAL EXAMINATION: height is 5\' 4"  (1.626 m) and weight is 110 lb 14.4 oz (50.304 kg). Her temperature is 98.1 F (36.7 C). Her blood pressure is 104/66 and her pulse is 102. Her oxygen saturation is 96%.        ASSESSMENT: The patient is doing satisfactorily with treatment.  PLAN: We will continue with the patient's radiation treatment as planned. As noted above, the patient was strongly encouraged to not miss any further days. We did discuss that this may be increasingly difficult  given that side effects will increase as we proceed through the second half of treatment.

## 2012-08-06 NOTE — Telephone Encounter (Signed)
lvmf or pt regarding to appts for june and july.....mailed pt avs and letter

## 2012-08-06 NOTE — Progress Notes (Signed)
Sherry Bautista here for weekly under treat visit.  She has had 14 fractions to her anal region.  She denies pain.  She says that she has had a little GI bug the past few days with vomiting and diarrhea.  She was also running a low grade temp of 99.5.  She has missed treatment 3 days this week.  She states that her skin in the treatment area is getting red and blistered.  She is using biafine cream.  She was given a sitz bath today and instructed how to use it.

## 2012-08-06 NOTE — Progress Notes (Signed)
   [facility]    OFFICE PROGRESS NOTE   INTERVAL HISTORY:   She returns as scheduled. She completed a fifth treatment with cisplatin on 07/30/2012. She reports no significant nausea after Decadron prophylaxis was added following chemotherapy. She has noted increased numbness in the hands and feet. This does not interfere with activity. Increased skin erythema and breakdown at the groin. Stable dyspnea.  Objective:  Vital signs in last 24 hours:  Blood pressure 121/72, pulse 104, temperature 97 F (36.1 C), temperature source Oral, resp. rate 19, height 5\' 4"  (1.626 m), weight 110 lb 3.2 oz (49.986 kg).    HEENT: No thrush or ulcer Lymphatics: No inguinal nodes Resp: Distant breath sounds, scattered rhonchi Cardio: Regular rate and rhythm GI: No hepatomegaly, no mass, nontender Vascular: No leg edema  Skin: Erythema at the groin and perineum  Neurologic: Mild decrease in vibratory sense at the fingertips bilaterally  Portacath/PICC-without erythema  Lab Results:  Lab Results  Component Value Date   WBC 4.4 08/06/2012   HGB 11.0* 08/06/2012   HCT 34.9 08/06/2012   MCV 103.3* 08/06/2012   PLT 122* 08/06/2012   ANC 3.2    Medications: I have reviewed the patient's current medications.  Assessment/Plan: 1.Vulvar/Anal cancer, locally advanced, staging PET scan revealed hypermetabolic circumferential thickening at the distal rectum/anus, bilateral hypermetabolic inguinal nodes and no evidence of distant metastatic disease.  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 Pain secondary to the locally advanced anal cancer -improved  4. COPD-oxygen dependent  5. Hyperplastic polyps on colonoscopy 05/31/2012  6. Nausea following cisplatin chemotherapy-improved with prophylactic Decadron following chemotherapy  7. Allergic reaction with respiratory failure following Emend premedication on  07/07/2012-the respiratory failure improved with Solu-Medrol, bronchodilator therapy, and epinephrine.  8. 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 I discussed the case with Dr. Mitzi Hansen. Ms. Enyeart has missed several radiation treatments over the past few weeks. He favors giving an additional week of cisplatin. I will dose reduce the cisplatin secondary to the neuropathy symptoms.  Ms. Grubb requested we move the final treatment with cisplatin to 08/09/2012. She will return for an office visit 08/31/2012.     Thornton Papas, MD  08/06/2012  6:31 PM

## 2012-08-09 ENCOUNTER — Telehealth: Payer: Self-pay | Admitting: Gynecologic Oncology

## 2012-08-09 ENCOUNTER — Ambulatory Visit
Admission: RE | Admit: 2012-08-09 | Discharge: 2012-08-09 | Disposition: A | Discharge status: Home / Self Care | Payer: BC Managed Care – PPO | Source: Ambulatory Visit | Attending: Radiation Oncology | Admitting: Radiation Oncology

## 2012-08-09 ENCOUNTER — Ambulatory Visit (HOSPITAL_BASED_OUTPATIENT_CLINIC_OR_DEPARTMENT_OTHER): Payer: BC Managed Care – PPO

## 2012-08-09 ENCOUNTER — Ambulatory Visit: Payer: BC Managed Care – PPO

## 2012-08-09 VITALS — BP 122/67 | HR 88 | Temp 98.4°F

## 2012-08-09 DIAGNOSIS — C211 Malignant neoplasm of anal canal: Secondary | ICD-10-CM

## 2012-08-09 DIAGNOSIS — Z5111 Encounter for antineoplastic chemotherapy: Secondary | ICD-10-CM

## 2012-08-09 DIAGNOSIS — C519 Malignant neoplasm of vulva, unspecified: Secondary | ICD-10-CM

## 2012-08-09 MED ORDER — ONDANSETRON 16 MG/50ML IVPB (CHCC)
16.0000 mg | Freq: Once | INTRAVENOUS | Status: AC
  Administered 2012-08-09: 16 mg via INTRAVENOUS

## 2012-08-09 MED ORDER — SODIUM CHLORIDE 0.9 % IV SOLN
20.0000 mg/m2 | Freq: Once | INTRAVENOUS | Status: AC
  Administered 2012-08-09: 31 mg via INTRAVENOUS
  Filled 2012-08-09: qty 31

## 2012-08-09 MED ORDER — DEXAMETHASONE SODIUM PHOSPHATE 20 MG/5ML IJ SOLN
20.0000 mg | Freq: Once | INTRAMUSCULAR | Status: AC
  Administered 2012-08-09: 20 mg via INTRAVENOUS

## 2012-08-09 MED ORDER — POTASSIUM CHLORIDE 2 MEQ/ML IV SOLN
Freq: Once | INTRAVENOUS | Status: AC
  Administered 2012-08-09: 09:00:00 via INTRAVENOUS
  Filled 2012-08-09: qty 10

## 2012-08-09 MED ORDER — SODIUM CHLORIDE 0.9 % IV SOLN
Freq: Once | INTRAVENOUS | Status: AC
  Administered 2012-08-09: 09:00:00 via INTRAVENOUS

## 2012-08-09 NOTE — Patient Instructions (Addendum)
[facility] Discharge Instructions for Patients Receiving Chemotherapy  Today you received the following chemotherapy agents: cisplatin  To help prevent nausea and vomiting after your treatment, we encourage you to take your nausea medication.  Take it as often as prescribed.     If you develop nausea and vomiting that is not controlled by your nausea medication, call the clinic. If it is after clinic hours your family physician or the after hours number for the clinic or go to the Emergency Department.   BELOW ARE SYMPTOMS THAT SHOULD BE REPORTED IMMEDIATELY:  *FEVER GREATER THAN 100.5 F  *CHILLS WITH OR WITHOUT FEVER  NAUSEA AND VOMITING THAT IS NOT CONTROLLED WITH YOUR NAUSEA MEDICATION  *UNUSUAL SHORTNESS OF BREATH  *UNUSUAL BRUISING OR BLEEDING  TENDERNESS IN MOUTH AND THROAT WITH OR WITHOUT PRESENCE OF ULCERS  *URINARY PROBLEMS  *BOWEL PROBLEMS  UNUSUAL RASH Items with * indicate a potential emergency and should be followed up as soon as possible.  Feel free to call the clinic you have any questions or concerns. The clinic phone number is [phone].   I have been informed and understand all the instructions given to me. I know to contact the clinic, my physician, or go to the Emergency Department if any problems should occur. I do not have any questions at this time, but understand that I may call the clinic during office hours   should I have any questions or need assistance in obtaining follow up care.    __________________________________________  _____________  __________ Signature of Patient or Authorized Representative            Date                   Time    __________________________________________ Nurse's Signature    

## 2012-08-09 NOTE — Telephone Encounter (Signed)
Consult Note: Gyn-Onc   CC:  Vulvar/Anal cancer  Assessment/Plan:  [name]  is a 57 y.o.  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  [name] has significnat pulmonary compromise.  Findings are suspicious for a vulvar cancer with extension into the vagina and rectum. It's very difficult to determine whether or not this is a primary anal versus vulvar cancer.  In vulvar cancer the strategy to dissect the inguinal nodes prior to administration of chemoradiation. However this patient is so oxygen-dependent it's unlikely that she could be cleared for surgery within a short period of time. She is receiving  chemoradiation with use of cis-platinum as a sensitizing agent..  This treatment course has been poorly tolerated.   HPI: This is a 57 year old gravida 1 para 0 who gives a history of recurrent labial cyst for several months treated with Keflex. Patient states that biopsies were obtained but no malignancy was identified. She presented with fecal discharge from the vagina on 05/11/2012 she underwent colonoscopy was noted to have an anterior anal mass. 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  Other findings of colonoscopy were that of diverticulosis and a few small polypoid flat-shaped polyps. The patient reports fecal discharge from the vagina and discomfort in the vulva area overall. A PET scan at this time has been scheduled for 06/16/2012. Given the presumptive diagnosis of rectal cancer she schedule for chemotherapy with mitomycin C. and 5-FU on 06/21/2012. She's also set for radiation oncology simulation on June 16 2012.  The patient is currently received radiotherapy with CDDP sensitization.  She is poorly tolerant of treatment and has missed severl visits.  The plan is for an additional cycle of dose reduced CDDP.    Current Meds:  Outpatient Encounter Prescriptions as of 08/09/2012  Medication Sig Dispense Refill  .  albuterol (PROVENTIL HFA;VENTOLIN HFA) 108 (90 BASE) MCG/ACT inhaler Inhale 2 puffs into the lungs every 4 (four) hours as needed for wheezing or shortness of breath.      . ALPRAZolam (XANAX) 0.25 MG tablet Take 0.25 mg by mouth daily as needed for anxiety.      . AMBULATORY NON FORMULARY MEDICATION Oxygen: 3 liters at rest and 4 liters with activity       . buPROPion (WELLBUTRIN SR) 150 MG 12 hr tablet Take 1 tablet (150 mg total) by mouth 2 (two) times daily.  60 tablet  2  . dexamethasone (DECADRON) 4 MG tablet Take twice a day and continue on day 1 and 2 after chemo. Then decrease to 4mg /day for 2 days, then 1/2 tab for 2 days and then resume typical post-chemo regimen      . diphenhydrAMINE (BENADRYL) 25 MG tablet Take 1 tablet (25 mg total) by mouth every 6 (six) hours as needed for itching.  30 tablet  0  . famotidine (PEPCID AC) 10 MG tablet Take two tabs before bed for next 2 days then stop      . ibuprofen (ADVIL) 200 MG tablet Take 1-2 tablets (200-400 mg total) by mouth every 6 (six) hours as needed for pain.  30 tablet  0  . loperamide (IMODIUM) 2 MG capsule Take 2 mg by mouth 4 (four) times daily as needed for diarrhea or loose stools.      . mometasone-formoterol (DULERA) 200-5 MCG/ACT AERO Inhale 2 puffs into the lungs 2 (two) times daily.  1 Inhaler  11  . oxyCODONE (OXY IR/ROXICODONE) 5 MG  immediate release tablet Take 5 mg by mouth every 6 (six) hours as needed for pain.      Marland Kitchen PARoxetine (PAXIL-CR) 25 MG 24 hr tablet Take 1 tablet (25 mg total) by mouth 2 (two) times daily.  60 tablet  3  . PRESCRIPTION MEDICATION Inject into the vein every 7 (seven) days. Cisplatin, Aloxi and Emend infusion q7d (usually on Mondays)      . prochlorperazine (COMPAZINE) 10 MG tablet Take 1 tablet (10 mg total) by mouth every 6 (six) hours as needed (nausea).  60 tablet  1  . tiotropium (SPIRIVA) 18 MCG inhalation capsule Place 1 capsule (18 mcg total) into inhaler and inhale daily.  30 capsule  11    No facility-administered encounter medications on file as of 08/09/2012.    Allergy:  Allergies  Allergen Reactions  . Azithromycin     rash  . Emend (Fosaprepitant Dimeglumine) Anaphylaxis, Shortness Of Breath and Swelling  . Tetanus-Diphtheria Toxoids Td     Redness, swelling, stiffness of arm   . Codeine Sulfate     itching    Social Hx:   History   Social History  . Marital Status: Divorced    Spouse Name: N/A    Number of Children: 0  . Years of Education: N/A   Occupational History  . [employer]    Social History Main Topics  . Smoking status: Current Every Day Smoker -- 1.00 packs/day for 30 years    Types: Cigarettes  . Smokeless tobacco: Never Used     Comment: started smoking at age 59.  Currently smoking 1ppd, smoking cessation info given  . Alcohol Use: No     Comment: very rarely  . Drug Use: No     Comment: marijuana yrs ago  . Sexually Active: No     Comment: menarche age 62   Other Topics Concern  . Not on file   Social History Narrative   Divorced-lives alone   Has #2 dogs and #2 cats   No children   Formerly worked at [employer]   Continuous oxygen 3-4 l/min [place] per [facility]    Past Surgical Hx:  Past Surgical History  Procedure Laterality Date  . Nasal sinus surgery    . Tonsillectomy    . Knee surgery  2003    cartlage repair  . Breast lumpectomy Left 15-20 yrs ago    benign cyst  . Rectal abscess removal    . Colonoscopy N/A 05/31/2012    Procedure: COLONOSCOPY;  Surgeon: Romie Levee, MD;  Location: [facility];  Service: Endoscopy;  Laterality: N/A;  . Cardiac catherization      no blockages    Past Medical Hx:  Past Medical History  Diagnosis Date  . Tobacco use disorder   . Depression   . Anxiety   . Allergic rhinitis     allergy shots  w/o significant response  . Hyperglycemia   . COPD (chronic obstructive pulmonary disease)   . Cancer 05/11/12    rectal cancer, XRT/ChemorX per [facility]  .  Bartholin cyst 07/2011  . Recto-vaginal fistula   . Osteopenia     Past Gynecological History:  Gravida 1 para 0 menarche at age 34 with regular menses until 2-3 years ago history of cervical dysplasia treated with cryotherapy. Patient states that recent Pap tests have been within normal limits No LMP recorded. Patient is postmenopausal.  Family Hx:  Family History  Problem Relation Age of Onset  .  Lung cancer Father   . COPD Father   . Hyperlipidemia Father   . Cancer Father   . Factor IX deficiency Mother     PTE  . Asthma Sister   . Hypertension Sister   . Heart attack Maternal Grandmother   . Heart attack Maternal Grandfather   . Heart attack Paternal Grandmother     Review of Systems:  Constitutional  Feels well Cardiovascular  No chest pain, shortness of breath, or edema  Pulmonary  No cough or wheeze.  Gastro Intestinal  No nausea, vomitting, or diarrhoea. No abdominal pain,  Genito Urinary  No frequency, urgency, dysuria, reports vulvar discomfort for several years  Musculo Skeletal  No myalgia, arthralgia, joint swelling or pain  Neurologic  No weakness, numbness, change in gait,  Psychology  No depression, anxiety, insomnia.   Vitals: BP 110/60  Temp(Src) 97.7 F (36.5 C) (Oral)  Resp 18  Ht 5' 4.57" (1.64 m)  Wt 113 lb 6.4 oz (51.438 kg)  BMI 19.12 kg/m2   Physical Exam: WD in NAD, thin cachectic with nasal oxygen cannula Neck  Supple NROM, without any enlargements.  Lymph Node Survey No cervical supraclavicular adenopathy.  A right 2 cm inguinal adenopathy Cardiovascular  Pulse normal rate, regularity and rhythm. S1 and S2 normal.  Lungs  Clear to auscultation bilateraly, distant breath sounds bilaterally involving the lower lobes  Psychiatry  Alert and oriented to person, place, and time  Abdomen  Normoactive bowel sounds, abdomen soft, non-tender. Back No CVA tenderness Genito Urinary  Vulva/vagina: Involvement of bilateral lobar vulva  with malignancy predominantly left-sided replacing the Bartholin's gland over a single midline and extending into the vagina.      Cervix: Palpably normal    Uterus: Unable to assess  Rectal  Fair  tone, rectal mass appreciated extending approximately 3 cm of the rectovaginal septum. Condyloma appreciated at the anus  Extremities  No bilateral cyanosis, clubbing or edema.    Laurette Schimke, MD, PhD 08/09/2012, 9:55 PM

## 2012-08-10 ENCOUNTER — Ambulatory Visit
Admission: RE | Admit: 2012-08-10 | Discharge: 2012-08-10 | Disposition: A | Discharge status: Home / Self Care | Payer: BC Managed Care – PPO | Source: Ambulatory Visit | Attending: Radiation Oncology | Admitting: Radiation Oncology

## 2012-08-10 ENCOUNTER — Ambulatory Visit: Payer: BC Managed Care – PPO | Admitting: Gynecologic Oncology

## 2012-08-10 ENCOUNTER — Ambulatory Visit: Payer: BC Managed Care – PPO

## 2012-08-11 ENCOUNTER — Ambulatory Visit
Admission: RE | Admit: 2012-08-11 | Discharge: 2012-08-11 | Disposition: A | Discharge status: Home / Self Care | Payer: BC Managed Care – PPO | Source: Ambulatory Visit | Attending: Radiation Oncology | Admitting: Radiation Oncology

## 2012-08-11 ENCOUNTER — Encounter: Payer: Self-pay | Admitting: Radiation Oncology

## 2012-08-11 VITALS — BP 128/74 | HR 88 | Temp 98.3°F | Resp 22 | Wt 115.1 lb

## 2012-08-11 DIAGNOSIS — C210 Malignant neoplasm of anus, unspecified: Secondary | ICD-10-CM

## 2012-08-11 NOTE — Progress Notes (Signed)
Department of Radiation Oncology  Phone:  [phone] Fax:        [phone]  Weekly Treatment Note    Name: Sherry Bautista Date: 08/11/2012 MRN: [id number] DOB: 10/14/1955   Current dose: 30.6 Gy  Current fraction: 17   MEDICATIONS: Current Outpatient Prescriptions  Medication Sig Dispense Refill  . albuterol (PROVENTIL HFA;VENTOLIN HFA) 108 (90 BASE) MCG/ACT inhaler Inhale 2 puffs into the lungs every 4 (four) hours as needed for wheezing or shortness of breath.      . ALPRAZolam (XANAX) 0.25 MG tablet Take 0.25 mg by mouth daily as needed for anxiety.      . AMBULATORY NON FORMULARY MEDICATION Oxygen: 3 liters at rest and 4 liters with activity       . buPROPion (WELLBUTRIN SR) 150 MG 12 hr tablet Take 1 tablet (150 mg total) by mouth 2 (two) times daily.  60 tablet  2  . dexamethasone (DECADRON) 4 MG tablet Take twice a day and continue on day 1 and 2 after chemo. Then decrease to 4mg /day for 2 days, then 1/2 tab for 2 days and then resume typical post-chemo regimen      . diphenhydrAMINE (BENADRYL) 25 MG tablet Take 1 tablet (25 mg total) by mouth every 6 (six) hours as needed for itching.  30 tablet  0  . famotidine (PEPCID AC) 10 MG tablet Take two tabs before bed for next 2 days then stop      . ibuprofen (ADVIL) 200 MG tablet Take 1-2 tablets (200-400 mg total) by mouth every 6 (six) hours as needed for pain.  30 tablet  0  . loperamide (IMODIUM) 2 MG capsule Take 2 mg by mouth 4 (four) times daily as needed for diarrhea or loose stools.      . mometasone-formoterol (DULERA) 200-5 MCG/ACT AERO Inhale 2 puffs into the lungs 2 (two) times daily.  1 Inhaler  11  . oxyCODONE (OXY IR/ROXICODONE) 5 MG immediate release tablet Take 5 mg by mouth every 6 (six) hours as needed for pain.      Marland Kitchen PARoxetine (PAXIL-CR) 25 MG 24 hr tablet Take 1 tablet (25 mg total) by mouth 2 (two) times daily.  60 tablet  3  . PRESCRIPTION MEDICATION Inject into the vein every 7 (seven) days.  Cisplatin, Aloxi and Emend infusion q7d (usually on Mondays)      . prochlorperazine (COMPAZINE) 10 MG tablet Take 1 tablet (10 mg total) by mouth every 6 (six) hours as needed (nausea).  60 tablet  1  . tiotropium (SPIRIVA) 18 MCG inhalation capsule Place 1 capsule (18 mcg total) into inhaler and inhale daily.  30 capsule  11   No current facility-administered medications for this encounter.     ALLERGIES: Azithromycin; Emend; Tetanus-diphtheria toxoids td; and Codeine sulfate   LABORATORY DATA:  Lab Results  Component Value Date   WBC 4.4 08/06/2012   HGB 11.0* 08/06/2012   HCT 34.9 08/06/2012   MCV 103.3* 08/06/2012   PLT 122* 08/06/2012   Lab Results  Component Value Date   NA 139 07/30/2012   K 4.0 07/30/2012   CL 97* 07/30/2012   CO2 32* 07/30/2012   Lab Results  Component Value Date   ALT 13 07/16/2012   AST 12 07/16/2012   ALKPHOS 90 07/16/2012   BILITOT 0.54 07/16/2012     NARRATIVE: Sherry Bautista was seen today for weekly treatment management. The chart was checked and the patient's films were reviewed. The patient  asked to be seen today complaining of some swelling in the left foot. He states that this has been present for several days. She began elevating this foot last night and this has markedly improved.  PHYSICAL EXAMINATION: weight is 115 lb 1.6 oz (52.209 kg). Her oral temperature is 98.3 F (36.8 C). Her blood pressure is 128/74 and her pulse is 88. Her respiration is 22 and oxygen saturation is 98%.      slight swelling in the foot itself. No swelling in the calf. No calf tenderness.  ASSESSMENT: The patient is doing satisfactorily with treatment.  PLAN: We will continue with the patient's radiation treatment as planned. She will continue to elevate her foot is to let us know if this persists. Her breathing is stable. No significant GI complaints currently.

## 2012-08-11 NOTE — Progress Notes (Signed)
Patient to nursing after rad tx anal 17/30 completed, in w/c and Oxygen, c/o sob and left foot swelling, oxygen level 98% on 3.5 liters, n/c, , also c/o left foot swelling since Friday with her last chemotherapy treatment, swelling ahs increased since then, difficult to walk on foot, bowels regular, no dysuria, no nausea,  Appetite fair 10:23 AM

## 2012-08-12 ENCOUNTER — Ambulatory Visit
Admission: RE | Admit: 2012-08-12 | Discharge: 2012-08-12 | Disposition: A | Discharge status: Home / Self Care | Payer: BC Managed Care – PPO | Source: Ambulatory Visit | Attending: Radiation Oncology | Admitting: Radiation Oncology

## 2012-08-12 ENCOUNTER — Ambulatory Visit: Payer: BC Managed Care – PPO

## 2012-08-13 ENCOUNTER — Ambulatory Visit: Payer: BC Managed Care – PPO

## 2012-08-14 ENCOUNTER — Ambulatory Visit: Payer: BC Managed Care – PPO

## 2012-08-16 ENCOUNTER — Ambulatory Visit
Admission: RE | Admit: 2012-08-16 | Discharge: 2012-08-16 | Disposition: A | Discharge status: Home / Self Care | Payer: BC Managed Care – PPO | Source: Ambulatory Visit | Attending: Radiation Oncology | Admitting: Radiation Oncology

## 2012-08-17 ENCOUNTER — Ambulatory Visit: Payer: BC Managed Care – PPO

## 2012-08-17 ENCOUNTER — Ambulatory Visit
Admission: RE | Admit: 2012-08-17 | Discharge: 2012-08-17 | Disposition: A | Discharge status: Home / Self Care | Payer: BC Managed Care – PPO | Source: Ambulatory Visit | Attending: Radiation Oncology | Admitting: Radiation Oncology

## 2012-08-18 ENCOUNTER — Ambulatory Visit
Admission: RE | Admit: 2012-08-18 | Discharge: 2012-08-18 | Disposition: A | Discharge status: Home / Self Care | Payer: BC Managed Care – PPO | Source: Ambulatory Visit | Attending: Radiation Oncology | Admitting: Radiation Oncology

## 2012-08-19 ENCOUNTER — Ambulatory Visit: Payer: BC Managed Care – PPO

## 2012-08-19 ENCOUNTER — Encounter: Payer: Self-pay | Admitting: Radiation Oncology

## 2012-08-19 ENCOUNTER — Encounter: Payer: Self-pay | Admitting: Gynecologic Oncology

## 2012-08-19 ENCOUNTER — Ambulatory Visit
Admission: RE | Admit: 2012-08-19 | Discharge: 2012-08-19 | Disposition: A | Discharge status: Home / Self Care | Payer: BC Managed Care – PPO | Source: Ambulatory Visit | Attending: Radiation Oncology | Admitting: Radiation Oncology

## 2012-08-19 ENCOUNTER — Ambulatory Visit: Payer: BC Managed Care – PPO | Attending: Gynecologic Oncology | Admitting: Gynecologic Oncology

## 2012-08-19 VITALS — BP 120/58 | HR 104 | Temp 98.3°F | Resp 24 | Ht 64.57 in | Wt 109.8 lb

## 2012-08-19 VITALS — BP 139/80 | HR 83 | Temp 97.9°F | Resp 22 | Wt 110.1 lb

## 2012-08-19 DIAGNOSIS — C210 Malignant neoplasm of anus, unspecified: Secondary | ICD-10-CM

## 2012-08-19 DIAGNOSIS — J449 Chronic obstructive pulmonary disease, unspecified: Secondary | ICD-10-CM | POA: Insufficient documentation

## 2012-08-19 DIAGNOSIS — R599 Enlarged lymph nodes, unspecified: Secondary | ICD-10-CM | POA: Insufficient documentation

## 2012-08-19 DIAGNOSIS — J4489 Other specified chronic obstructive pulmonary disease: Secondary | ICD-10-CM | POA: Insufficient documentation

## 2012-08-19 DIAGNOSIS — M949 Disorder of cartilage, unspecified: Secondary | ICD-10-CM | POA: Insufficient documentation

## 2012-08-19 DIAGNOSIS — M899 Disorder of bone, unspecified: Secondary | ICD-10-CM | POA: Insufficient documentation

## 2012-08-19 DIAGNOSIS — C519 Malignant neoplasm of vulva, unspecified: Secondary | ICD-10-CM | POA: Insufficient documentation

## 2012-08-19 DIAGNOSIS — F17200 Nicotine dependence, unspecified, uncomplicated: Secondary | ICD-10-CM | POA: Insufficient documentation

## 2012-08-19 MED ORDER — ALPRAZOLAM 0.25 MG PO TABS: 0.2500 mg | ORAL_TABLET | Freq: Every day | ORAL | Status: DC | PRN

## 2012-08-19 NOTE — Progress Notes (Signed)
Department of Radiation Oncology  Phone:  [phone] Fax:        [phone]  Weekly Treatment Note    Name: Sherry Bautista Date: 08/19/2012 MRN: [id number] DOB: [birth year]   Current dose: 39.6 Gy  Current fraction: 22   MEDICATIONS: Current Outpatient Prescriptions  Medication Sig Dispense Refill  . albuterol (PROVENTIL HFA;VENTOLIN HFA) 108 (90 BASE) MCG/ACT inhaler Inhale 2 puffs into the lungs every 4 (four) hours as needed for wheezing or shortness of breath.      . ALPRAZolam (XANAX) 0.25 MG tablet Take 1 tablet (0.25 mg total) by mouth daily as needed for anxiety.  30 tablet  0  . AMBULATORY NON FORMULARY MEDICATION Oxygen: 3 liters at rest and 4 liters with activity       . buPROPion (WELLBUTRIN SR) 150 MG 12 hr tablet Take 1 tablet (150 mg total) by mouth 2 (two) times daily.  60 tablet  2  . dexamethasone (DECADRON) 4 MG tablet Take twice a day and continue on day 1 and 2 after chemo. Then decrease to 4mg /day for 2 days, then 1/2 tab for 2 days and then resume typical post-chemo regimen      . diphenhydrAMINE (BENADRYL) 25 MG tablet Take 1 tablet (25 mg total) by mouth every 6 (six) hours as needed for itching.  30 tablet  0  . famotidine (PEPCID AC) 10 MG tablet Take two tabs before bed for next 2 days then stop      . ibuprofen (ADVIL) 200 MG tablet Take 1-2 tablets (200-400 mg total) by mouth every 6 (six) hours as needed for pain.  30 tablet  0  . loperamide (IMODIUM) 2 MG capsule Take 2 mg by mouth 4 (four) times daily as needed for diarrhea or loose stools.      . mometasone-formoterol (DULERA) 200-5 MCG/ACT AERO Inhale 2 puffs into the lungs 2 (two) times daily.  1 Inhaler  11  . oxyCODONE (OXY IR/ROXICODONE) 5 MG immediate release tablet Take 5 mg by mouth every 6 (six) hours as needed for pain.      Marland Kitchen PARoxetine (PAXIL-CR) 25 MG 24 hr tablet Take 1 tablet (25 mg total) by mouth 2 (two) times daily.  60 tablet  3  . prochlorperazine (COMPAZINE) 10 MG tablet  Take 1 tablet (10 mg total) by mouth every 6 (six) hours as needed (nausea).  60 tablet  1  . tiotropium (SPIRIVA) 18 MCG inhalation capsule Place 1 capsule (18 mcg total) into inhaler and inhale daily.  30 capsule  11  . PRESCRIPTION MEDICATION Inject into the vein every 7 (seven) days. Cisplatin, Aloxi and Emend infusion q7d (usually on Mondays)       No current facility-administered medications for this encounter.     ALLERGIES: Azithromycin; Emend; Tetanus-diphtheria toxoids td; and Codeine sulfate   LABORATORY DATA:  Lab Results  Component Value Date   WBC 4.4 08/06/2012   HGB 11.0* 08/06/2012   HCT 34.9 08/06/2012   MCV 103.3* 08/06/2012   PLT 122* 08/06/2012   Lab Results  Component Value Date   NA 139 07/30/2012   K 4.0 07/30/2012   CL 97* 07/30/2012   CO2 32* 07/30/2012   Lab Results  Component Value Date   ALT 13 07/16/2012   AST 12 07/16/2012   ALKPHOS 90 07/16/2012   BILITOT 0.54 07/16/2012     NARRATIVE: Sherry Bautista was seen today for weekly treatment management. The chart was checked and the patient's films  were reviewed. The patient is doing well she states. Some irritation in the anal region but really no pain at this time. She is continuing to use skin cream which she states works for a well. She has felt well overall the last several days.  PHYSICAL EXAMINATION: weight is 110 lb 1.6 oz (49.941 kg). Her oral temperature is 97.9 F (36.6 C). Her blood pressure is 139/80 and her pulse is 83. Her respiration is 22 and oxygen saturation is 98%.      significant diffuse erythema in the treatment area. Some hyperpigmentation. Really no desquamation at this point.  ASSESSMENT: The patient is doing satisfactorily with treatment.  PLAN: We will continue with the patient's radiation treatment as planned. The patient's skin looks quite good. She has missed some treatment which has yielded a milder skin effect. She was encouraged again to continue her treatment on a daily basis  which she has been doing more regularly recently. We have 8 more fractions remaining.

## 2012-08-19 NOTE — Progress Notes (Signed)
Consult Note: Gyn-Onc   CC:  Vulvar/Anal cancer  Assessment/Plan:  [name]  is a 57 y.o.  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  [name] has significant pulmonary compromise.  Findings are suspicious for a vulvar cancer with extension into the vagina and rectum. It's very difficult to determine whether or not this is a primary anal versus vulvar cancer.   She is receiving  chemoradiation with use of cis-platinum as a sensitizing agent..  This treatment course has been poorly tolerated. Approximately 7 radiotherapy cycles remain. There has been a very impressive response to this treatment. There is no visible residual disease although on rectal examination there is firmness within the rectovaginal septum. The patient reports intermittent evidence of fecal material within the vagina. None was appreciated at this visit. She was counseled that if the fistula remains her options would be limiting passage of fecal material into the vagina by making stool is firm as possible or a permanent diverting colostomy. However given her medical comorbidities preoperative clearance for this procedure may be challenging.  Residual right inguinal adenopathy is appreciated. I've asked the patient to followup in 2 months to assess the status of this lymph node.   HPI: This is a 57 year old gravida 1 para 0 who gives a history of recurrent labial cyst for several months treated with Keflex. Patient states that biopsies were obtained but no malignancy was identified. She presented with fecal discharge from the vagina on 05/11/2012 she underwent colonoscopy was noted to have an anterior anal mass. 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  Other findings of colonoscopy were that of diverticulosis and a few small polypoid flat-shaped polyps. The patient reports fecal discharge from the vagina and discomfort in the vulva area overall. A PET scan  06/16/2012.  Abdomen/Pelvis: There is intense hypermetabolic mass-like thickening surrounding the distal rectum and extending to the anus. This extends from approximately S1 inferiorly to the level of the anal verge. The circumferential mass-like thickening measures up to 2.5 cm in single wall thickness with SUV max = 19.8. There is a hypermetabolic right inguinal lymph node measuring 13 mm (image 226) with SUV max = 13.1. Smaller hypermetabolic left inguinal lymph (454) measuring 10 mm.  Skeleton: No focal hypermetabolic activity to suggest skeletal metastasis.  It was difficult to determine whether this is vulvar or anal cancer.  The patient is currently received radiotherapy with CDDP sensitization that began 5/12//2014 .  She is poorly tolerant of treatment and has missed several visits.  The plan is for an additional cycle of dose reduced CDDP.    Current Meds:  Outpatient Encounter Prescriptions as of 08/19/2012  Medication Sig Dispense Refill  . albuterol (PROVENTIL HFA;VENTOLIN HFA) 108 (90 BASE) MCG/ACT inhaler Inhale 2 puffs into the lungs every 4 (four) hours as needed for wheezing or shortness of breath.      . ALPRAZolam (XANAX) 0.25 MG tablet Take 0.25 mg by mouth daily as needed for anxiety.      . AMBULATORY NON FORMULARY MEDICATION Oxygen: 3 liters at rest and 4 liters with activity       . buPROPion (WELLBUTRIN SR) 150 MG 12 hr tablet Take 1 tablet (150 mg total) by mouth 2 (two) times daily.  60 tablet  2  . dexamethasone (DECADRON) 4 MG tablet Take twice a day and continue on day 1 and 2 after chemo. Then decrease to 4mg /day for 2 days, then 1/2  tab for 2 days and then resume typical post-chemo regimen      . diphenhydrAMINE (BENADRYL) 25 MG tablet Take 1 tablet (25 mg total) by mouth every 6 (six) hours as needed for itching.  30 tablet  0  . famotidine (PEPCID AC) 10 MG tablet Take two tabs before bed for next 2 days then stop      . ibuprofen (ADVIL) 200 MG tablet Take 1-2  tablets (200-400 mg total) by mouth every 6 (six) hours as needed for pain.  30 tablet  0  . loperamide (IMODIUM) 2 MG capsule Take 2 mg by mouth 4 (four) times daily as needed for diarrhea or loose stools.      . mometasone-formoterol (DULERA) 200-5 MCG/ACT AERO Inhale 2 puffs into the lungs 2 (two) times daily.  1 Inhaler  11  . oxyCODONE (OXY IR/ROXICODONE) 5 MG immediate release tablet Take 5 mg by mouth every 6 (six) hours as needed for pain.      Marland Kitchen PARoxetine (PAXIL-CR) 25 MG 24 hr tablet Take 1 tablet (25 mg total) by mouth 2 (two) times daily.  60 tablet  3  . PRESCRIPTION MEDICATION Inject into the vein every 7 (seven) days. Cisplatin, Aloxi and Emend infusion q7d (usually on Mondays)      . prochlorperazine (COMPAZINE) 10 MG tablet Take 1 tablet (10 mg total) by mouth every 6 (six) hours as needed (nausea).  60 tablet  1  . tiotropium (SPIRIVA) 18 MCG inhalation capsule Place 1 capsule (18 mcg total) into inhaler and inhale daily.  30 capsule  11   No facility-administered encounter medications on file as of 08/19/2012.    Allergy:  Allergies  Allergen Reactions  . Azithromycin     rash  . Emend (Fosaprepitant Dimeglumine) Anaphylaxis, Shortness Of Breath and Swelling  . Tetanus-Diphtheria Toxoids Td     Redness, swelling, stiffness of arm   . Codeine Sulfate     itching    Social Hx:   History   Social History  . Marital Status: Divorced    Spouse Name: N/A    Number of Children: 0  . Years of Education: N/A   Occupational History  . [employer]    Social History Main Topics  . Smoking status: Current Every Day Smoker -- 1.00 packs/day for 30 years    Types: Cigarettes  . Smokeless tobacco: Never Used     Comment: started smoking at age 40.  Currently smoking 1ppd, smoking cessation info given  . Alcohol Use: No     Comment: very rarely  . Drug Use: No     Comment: marijuana yrs ago  . Sexually Active: No     Comment: menarche age 49   Other Topics Concern   . Not on file   Social History Narrative   Divorced-lives alone   Has #2 dogs and #2 cats   No children   Formerly worked at [employer]   Continuous oxygen 3-4 l/min [place] per [facility]    Past Surgical Hx:  Past Surgical History  Procedure Laterality Date  . Nasal sinus surgery    . Tonsillectomy    . Knee surgery  2003    cartlage repair  . Breast lumpectomy Left 15-20 yrs ago    benign cyst  . Rectal abscess removal    . Colonoscopy N/A 05/31/2012    Procedure: COLONOSCOPY;  Surgeon: Romie Levee, MD;  Location: [facility];  Service: Endoscopy;  Laterality: N/A;  .  Cardiac catherization      no blockages    Past Medical Hx:  Past Medical History  Diagnosis Date  . Tobacco use disorder   . Depression   . Anxiety   . Allergic rhinitis     allergy shots  w/o significant response  . Hyperglycemia   . COPD (chronic obstructive pulmonary disease)   . Cancer 05/11/12    rectal cancer, XRT/ChemorX per [facility]  . Bartholin cyst 07/2011  . Recto-vaginal fistula   . Osteopenia     Past Gynecological History:  Gravida 1 para 0 menarche at age 71 with regular menses until 2-3 years ago history of cervical dysplasia treated with cryotherapy. Patient states that recent Pap tests have been within normal limits No LMP recorded. Patient is postmenopausal.  Family Hx:  Family History  Problem Relation Age of Onset  . Lung cancer Father   . COPD Father   . Hyperlipidemia Father   . Cancer Father   . Factor IX deficiency Mother     PTE  . Asthma Sister   . Hypertension Sister   . Heart attack Maternal Grandmother   . Heart attack Maternal Grandfather   . Heart attack Paternal Grandmother     Review of Systems:  Constitutional  Feels well Cardiovascular  No chest pain, shortness of breath, or edema  Pulmonary  No cough or wheeze. Continues to smoke Gastro Intestinal  No nausea, vomitting, or diarrhoea. No abdominal pain, reports intermittent  diarrhea Genito Urinary  No frequency, urgency, dysuria, reports some mucus-like discharge in the vagina sometimes accompanied by frank fecal material.  Vulvar discomfort has improved markedly  Musculo Skeletal  No myalgia, arthralgia, joint swelling or pain  Neurologic  No weakness, numbness, change in gait,  Psychology  No depression, anxiety, insomnia.   Vitals: BP 120/58  Pulse 104  Temp(Src) 98.3 F (36.8 C)  Resp 24  Ht 5' 4.57" (1.64 m)  Wt 109 lb 12.8 oz (49.805 kg)  BMI 18.52 kg/m2    Physical Exam: WD in NAD, thin cachectic with nasal oxygen cannula Neck  Supple NROM, without any enlargements.  Lymph Node Survey No cervical supraclavicular adenopathy.  A right 1 cm inguinal lymph node is palpable  Cardiovascular  Pulse normal rate, regularity and rhythm. S1 and S2 normal.  Lungs  Clear to auscultation bilateraly, distant breath sounds bilaterally involving the lower lobes  Psychiatry  Alert and oriented to person, place, and time  Abdomen  Normoactive bowel sounds, abdomen soft, non-tender. Back No CVA tenderness Genito Urinary  Vulva/vagina: No gross residual disease is visible either within the vulva or posterior distal vagina.  Significant erythema of the external genitalia.  No fecal material noted within the vagina  Cervix: Palpably normal    Uterus: Unable to assess  Rectal  Firmess/thickening is noted in the distal rectovaginal septum but not grossly nodular as before. Condyloma has resolved.  Extremities  No bilateral cyanosis, clubbing or edema.    Laurette Schimke, MD, PhD 08/19/2012, 8:52 AM

## 2012-08-19 NOTE — Patient Instructions (Addendum)
F/U as discussed in 2 months  Decrease tobacco use.   Thank you very much Ms. [name] for allowing me to provide care for you today.  I appreciate your confidence in choosing our Gynecologic Oncology team.  If you have any questions about your visit today please call our office and we will get back to you as soon as possible.  Maryclare Labrador. Keniel Ralston MD., PhD Gynecologic Oncology

## 2012-08-19 NOTE — Progress Notes (Addendum)
Weekly rad txs, anal 22/30 completed, in w/c, oxygen 3 liters, n/c 98%,  No c/o pain, discomfort,except cold,offered warm blanket, eating good stated, normal bowel movements stated, still red and blistered anal region using biafine cream

## 2012-08-23 ENCOUNTER — Ambulatory Visit: Payer: BC Managed Care – PPO

## 2012-08-24 ENCOUNTER — Telehealth: Payer: Self-pay | Admitting: Oncology

## 2012-08-24 ENCOUNTER — Ambulatory Visit: Payer: BC Managed Care – PPO

## 2012-08-24 NOTE — Telephone Encounter (Signed)
Called Sherry Bautista due to her canceling treatment yesterday and today.  [name] stated that she was having trouble with her breathing.  Asked if there was anything we could do for her and she said no.  She also said she will be coming to treatment tomorrow.

## 2012-08-25 ENCOUNTER — Ambulatory Visit
Admission: RE | Admit: 2012-08-25 | Discharge: 2012-08-25 | Disposition: A | Discharge status: Home / Self Care | Payer: BC Managed Care – PPO | Source: Ambulatory Visit | Attending: Radiation Oncology | Admitting: Radiation Oncology

## 2012-08-25 ENCOUNTER — Other Ambulatory Visit (HOSPITAL_BASED_OUTPATIENT_CLINIC_OR_DEPARTMENT_OTHER): Payer: BC Managed Care – PPO | Admitting: Lab

## 2012-08-25 DIAGNOSIS — C519 Malignant neoplasm of vulva, unspecified: Secondary | ICD-10-CM

## 2012-08-25 DIAGNOSIS — C210 Malignant neoplasm of anus, unspecified: Secondary | ICD-10-CM

## 2012-08-25 LAB — CBC WITH DIFFERENTIAL/PLATELET
BASO%: 0.5 % (ref 0.0–2.0)
EOS%: 0.7 % (ref 0.0–7.0)
HCT: 33.1 % — ABNORMAL LOW (ref 34.8–46.6)
MCH: 34.8 pg — ABNORMAL HIGH (ref 25.1–34.0)
MCHC: 33.3 g/dL (ref 31.5–36.0)
MONO#: 0.5 10*3/uL (ref 0.1–0.9)
NEUT%: 70.1 % (ref 38.4–76.8)
RBC: 3.17 10*6/uL — ABNORMAL LOW (ref 3.70–5.45)
RDW: 21.8 % — ABNORMAL HIGH (ref 11.2–14.5)
WBC: 2.5 10*3/uL — ABNORMAL LOW (ref 3.9–10.3)
lymph#: 0.3 10*3/uL — ABNORMAL LOW (ref 0.9–3.3)

## 2012-08-25 LAB — BASIC METABOLIC PANEL (CC13)
CO2: 37 mEq/L — ABNORMAL HIGH (ref 22–29)
Chloride: 95 mEq/L — ABNORMAL LOW (ref 98–109)
Creatinine: 0.6 mg/dL (ref 0.6–1.1)
Sodium: 140 mEq/L (ref 136–145)

## 2012-08-26 ENCOUNTER — Ambulatory Visit: Payer: BC Managed Care – PPO

## 2012-08-27 ENCOUNTER — Encounter: Payer: Self-pay | Admitting: Radiation Oncology

## 2012-08-27 ENCOUNTER — Ambulatory Visit
Admission: RE | Admit: 2012-08-27 | Discharge: 2012-08-27 | Disposition: A | Discharge status: Home / Self Care | Payer: BC Managed Care – PPO | Source: Ambulatory Visit | Attending: Radiation Oncology | Admitting: Radiation Oncology

## 2012-08-27 VITALS — BP 118/68 | HR 96 | Temp 98.1°F | Ht 64.5 in | Wt 112.3 lb

## 2012-08-27 DIAGNOSIS — C519 Malignant neoplasm of vulva, unspecified: Secondary | ICD-10-CM

## 2012-08-27 NOTE — Progress Notes (Signed)
Sherry Bautista here for weekly under treat visit.  She has had 24 fractions to her anal area.  She denies pain.  She does have occasional nausea.  She also has occasional diarrhea.  Her skin on her groin area and rectal area is red with a peeling area on her left vaginal area.  She is using biafine cream twice and day.  Recommended using a sitz bath twice a day.  She did have burning with urination the other day.  She does have fatigue.

## 2012-08-27 NOTE — Progress Notes (Signed)
  Radiation Oncology         [phone] ________________________________  Name: Sherry Bautista MRN: [id number]  Date: 08/27/2012  DOB: August 06, 1955  Weekly Radiation Therapy Management  Current Dose: 43.2 Gy     Planned Dose:  54 Gy  Narrative . . . . . . . . The patient presents for routine under treatment assessment.                                                     Clearence Cheek here for weekly under treat visit. She has had 24 fractions to her anal area. She denies pain. She does have occasional nausea. She also has occasional diarrhea. Her skin on her groin area and rectal area is red with a peeling area on her left vaginal area. She is using biafine cream twice and day. Recommended using a sitz bath twice a day. She did have burning with urination the other day. She does have fatigue                                 Set-up films were reviewed.                                 The chart was checked. Physical Findings. . .  height is 5' 4.5" (1.638 m) and weight is 112 lb 4.8 oz (50.939 kg). Her temperature is 98.1 F (36.7 C). Her blood pressure is 118/68 and her pulse is 96. Her oxygen saturation is 100%. . Weight essentially stable.  No significant changes. Impression . . . . . . . The patient is  tolerating radiation. Plan . . . . . . . . . . . . Continue treatment as planned.  ________________________________  Artist Pais. Kathrynn Running, M.D.

## 2012-08-30 ENCOUNTER — Ambulatory Visit: Payer: BC Managed Care – PPO

## 2012-08-31 ENCOUNTER — Ambulatory Visit: Payer: BC Managed Care – PPO

## 2012-08-31 ENCOUNTER — Ambulatory Visit (HOSPITAL_BASED_OUTPATIENT_CLINIC_OR_DEPARTMENT_OTHER): Payer: BC Managed Care – PPO | Admitting: Nurse Practitioner

## 2012-08-31 ENCOUNTER — Ambulatory Visit
Admission: RE | Admit: 2012-08-31 | Discharge: 2012-08-31 | Disposition: A | Discharge status: Home / Self Care | Payer: BC Managed Care – PPO | Source: Ambulatory Visit | Attending: Radiation Oncology | Admitting: Radiation Oncology

## 2012-08-31 ENCOUNTER — Telehealth: Payer: Self-pay | Admitting: Oncology

## 2012-08-31 VITALS — BP 125/64 | HR 101 | Temp 98.8°F | Resp 20 | Ht 64.5 in | Wt 108.3 lb

## 2012-08-31 DIAGNOSIS — C519 Malignant neoplasm of vulva, unspecified: Secondary | ICD-10-CM

## 2012-08-31 NOTE — Progress Notes (Signed)
OFFICE PROGRESS NOTE  Interval history:  Sherry Bautista returns as scheduled. She completed the sixth and final treatment with cisplatin on 08/09/2012. She will complete the planned course of radiation on 09/07/2012.  She denies nausea/vomiting. No mouth sores. She has a good appetite. She has been having diarrhea. She estimates 6-7 loose stools per day. The stools are large volume. She resumed Imodium earlier today. Imodium has been effective in the past. She denies any rectal bleeding. She continues supplemental oxygen; 3 L at rest and 4 L with exertion. She has stable numbness in the hands and feet.   Objective: Blood pressure 125/64, pulse 101, temperature 98.8 F (37.1 C), temperature source Oral, resp. rate 20, height 5' 4.5" (1.638 m), weight 108 lb 4.8 oz (49.125 kg).  Oropharynx is without thrush or ulceration. No palpable cervical or supraclavicular lymph nodes. Small right axillary lymph node. No palpable left axillary lymph nodes. No palpable inguinal lymph nodes. Breath sounds are diminished globally. Expiratory wheezes left anterior chest. Regular cardiac rhythm. Abdomen soft and nontender. No hepatomegaly. Extremities without edema. Skin hyperpigmentation with scattered areas of erythema at the groin and perineum.  Lab Results: Lab Results  Component Value Date   WBC 2.5* 08/25/2012   HGB 11.0* 08/25/2012   HCT 33.1* 08/25/2012   MCV 104.5* 08/25/2012   PLT 197 08/25/2012    Chemistry:    Chemistry      Component Value Date/Time   NA 140 08/25/2012 1024   NA 136 07/08/2012 0525   K 4.4 08/25/2012 1024   K 4.5 07/08/2012 0525   CL 97* 07/30/2012 0817   CL 99 07/08/2012 0525   CO2 37* 08/25/2012 1024   CO2 34* 07/08/2012 0525   BUN 8.2 08/25/2012 1024   BUN 12 07/08/2012 0525   CREATININE 0.6 08/25/2012 1024   CREATININE 0.48* 07/08/2012 0525   CREATININE 0.62 07/02/2011 1109      Component Value Date/Time   CALCIUM 9.3 08/25/2012 1024   CALCIUM 8.8 07/08/2012 0525   ALKPHOS 90 07/16/2012 0903    ALKPHOS 77 09/26/2010 1148   AST 12 07/16/2012 0903   AST 17 09/26/2010 1148   ALT 13 07/16/2012 0903   ALT 18 09/26/2010 1148   BILITOT 0.54 07/16/2012 0903   BILITOT 1.3* 09/26/2010 1148       Studies/Results: No results found.  Medications: I have reviewed the patient's current medications.  Assessment/Plan:  1.Vulvar/Anal cancer, locally advanced, staging PET scan revealed hypermetabolic circumferential thickening at the distal rectum/anus, bilateral hypermetabolic inguinal nodes and no evidence of distant metastatic disease.  -Initiation of weekly cisplatin and concurrent radiation 06/28/2012. She completed the sixth weekly cisplatin on 08/09/2012. She continues radiation.  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  3. Pain secondary to the locally advanced anal cancer -improved.  4. COPD-oxygen dependent.  5. Hyperplastic polyps on colonoscopy 05/31/2012.  6. Nausea following cisplatin chemotherapy-improved with prophylactic Decadron following chemotherapy.  7. Allergic reaction with respiratory failure following Emend premedication on 07/07/2012-the respiratory failure improved with Solu-Medrol, bronchodilator therapy, and epinephrine.  8. 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 The cisplatin was dose reduced with week 6. Stable symptoms. 9. Diarrhea. She resumed Imodium earlier today. She will contact the office if Imodium is not effective.  Disposition-[name] has completed 6 weekly treatments with cisplatin. She will complete the course of radiation on 09/07/2012. Dr. Truett Perna recommends a followup visit with Dr. Romie Levee approximately 1 month after the completion of radiation. She will return for a followup visit  with Dr. Truett Perna on 10/11/2012. She will contact the office in the interim with any problems.  Plan reviewed with Dr. Truett Perna.   Lonna Cobb ANP/GNP-BC

## 2012-08-31 NOTE — Telephone Encounter (Signed)
Gave pt appt for MD on August and will see Dr. Romie Levee on 8/21 4:30pm, pt aware of appt

## 2012-09-01 ENCOUNTER — Encounter: Payer: Self-pay | Admitting: Oncology

## 2012-09-01 ENCOUNTER — Ambulatory Visit: Payer: BC Managed Care – PPO

## 2012-09-01 ENCOUNTER — Ambulatory Visit
Admission: RE | Admit: 2012-09-01 | Discharge: 2012-09-01 | Disposition: A | Discharge status: Home / Self Care | Payer: BC Managed Care – PPO | Source: Ambulatory Visit | Attending: Radiation Oncology | Admitting: Radiation Oncology

## 2012-09-01 VITALS — BP 126/72 | HR 90 | Temp 98.1°F | Ht 64.5 in | Wt 107.8 lb

## 2012-09-01 DIAGNOSIS — C210 Malignant neoplasm of anus, unspecified: Secondary | ICD-10-CM

## 2012-09-01 NOTE — Progress Notes (Signed)
Department of Radiation Oncology  Phone:  [phone] Fax:        [phone]  Weekly Treatment Note    Name: Sherry Bautista Date: 09/01/2012 MRN: [id number] DOB: 02/01/1956   Current dose: 46.8 Gy  Current fraction: 26   MEDICATIONS: Current Outpatient Prescriptions  Medication Sig Dispense Refill  . albuterol (PROVENTIL HFA;VENTOLIN HFA) 108 (90 BASE) MCG/ACT inhaler Inhale 2 puffs into the lungs every 4 (four) hours as needed for wheezing or shortness of breath.      . ALPRAZolam (XANAX) 0.25 MG tablet Take 1 tablet (0.25 mg total) by mouth daily as needed for anxiety.  30 tablet  0  . AMBULATORY NON FORMULARY MEDICATION Oxygen: 3 liters at rest and 4 liters with activity       . buPROPion (WELLBUTRIN SR) 150 MG 12 hr tablet Take 1 tablet (150 mg total) by mouth 2 (two) times daily.  60 tablet  2  . diphenhydrAMINE (BENADRYL) 25 MG tablet Take 1 tablet (25 mg total) by mouth every 6 (six) hours as needed for itching.  30 tablet  0  . ibuprofen (ADVIL) 200 MG tablet Take 1-2 tablets (200-400 mg total) by mouth every 6 (six) hours as needed for pain.  30 tablet  0  . loperamide (IMODIUM) 2 MG capsule Take 2 mg by mouth 4 (four) times daily as needed for diarrhea or loose stools.      . mometasone-formoterol (DULERA) 200-5 MCG/ACT AERO Inhale 2 puffs into the lungs 2 (two) times daily.  1 Inhaler  11  . oxyCODONE (OXY IR/ROXICODONE) 5 MG immediate release tablet Take 5 mg by mouth every 6 (six) hours as needed for pain.      Marland Kitchen PARoxetine (PAXIL-CR) 25 MG 24 hr tablet Take 1 tablet (25 mg total) by mouth 2 (two) times daily.  60 tablet  3  . prochlorperazine (COMPAZINE) 10 MG tablet Take 1 tablet (10 mg total) by mouth every 6 (six) hours as needed (nausea).  60 tablet  1  . tiotropium (SPIRIVA) 18 MCG inhalation capsule Place 1 capsule (18 mcg total) into inhaler and inhale daily.  30 capsule  11   No current facility-administered medications for this encounter.      ALLERGIES: Azithromycin; Emend; Tetanus-diphtheria toxoids td; and Codeine sulfate   LABORATORY DATA:  Lab Results  Component Value Date   WBC 2.5* 08/25/2012   HGB 11.0* 08/25/2012   HCT 33.1* 08/25/2012   MCV 104.5* 08/25/2012   PLT 197 08/25/2012   Lab Results  Component Value Date   NA 140 08/25/2012   K 4.4 08/25/2012   CL 97* 07/30/2012   CO2 37* 08/25/2012   Lab Results  Component Value Date   ALT 13 07/16/2012   AST 12 07/16/2012   ALKPHOS 90 07/16/2012   BILITOT 0.54 07/16/2012     NARRATIVE: Sherry Bautista was seen today for weekly treatment management. The chart was checked and the patient's films were reviewed. The patient is doing well overall. She feels that her skin/anal region do not feel that bad. She is very pleased with her current skin cream and she is using sitz baths twice a day which also helps.  PHYSICAL EXAMINATION: height is 5' 4.5" (1.638 m) and weight is 107 lb 12.8 oz (48.898 kg). Her temperature is 98.1 F (36.7 C). Her blood pressure is 126/72 and her pulse is 90. Her oxygen saturation is 100%.      erythema in the treatment area. Overall  her skin has held up well.  ASSESSMENT: The patient is doing satisfactorily with treatment.  PLAN: We will continue with the patient's radiation treatment as planned.

## 2012-09-01 NOTE — Progress Notes (Signed)
Sherry Bautista here for weekly under treat visit.  Sherry Bautista has had 26 fractions to her anal area.  Sherry Bautista denies pain.  Sherry Bautista did have diarrhea Sunday and Monday.  Sherry Bautista did take imodium which helped.  Sherry Bautista does have fatigue.  Sherry Bautista stated that her skin is red and itching.  On inspection, the skin on her anal area and groin area is very red.  Sherry Bautista has hyperpigmentation on both upper thighs.  Sherry Bautista is using a sitz bath and biafine gel.

## 2012-09-01 NOTE — Progress Notes (Signed)
Put cancer form on nurse's desk. °

## 2012-09-02 ENCOUNTER — Ambulatory Visit: Payer: BC Managed Care – PPO

## 2012-09-02 ENCOUNTER — Encounter: Payer: Self-pay | Admitting: Oncology

## 2012-09-02 ENCOUNTER — Ambulatory Visit
Admission: RE | Admit: 2012-09-02 | Discharge: 2012-09-02 | Disposition: A | Discharge status: Home / Self Care | Payer: BC Managed Care – PPO | Source: Ambulatory Visit | Attending: Radiation Oncology | Admitting: Radiation Oncology

## 2012-09-02 NOTE — Progress Notes (Signed)
Faxed cancer claim form to [facility] @ [phone].

## 2012-09-03 ENCOUNTER — Ambulatory Visit: Payer: BC Managed Care – PPO

## 2012-09-03 ENCOUNTER — Ambulatory Visit
Admission: RE | Admit: 2012-09-03 | Discharge: 2012-09-03 | Disposition: A | Discharge status: Home / Self Care | Payer: BC Managed Care – PPO | Source: Ambulatory Visit | Attending: Radiation Oncology | Admitting: Radiation Oncology

## 2012-09-03 ENCOUNTER — Telehealth: Payer: Self-pay | Admitting: Oncology

## 2012-09-03 NOTE — Telephone Encounter (Signed)
Left a message for Sherry Bautista regarding her insurance papers.  She was advised that they were completed and at the Radiation Oncology waiting room desk waiting to be picked up.

## 2012-09-04 ENCOUNTER — Ambulatory Visit: Payer: BC Managed Care – PPO

## 2012-09-06 ENCOUNTER — Ambulatory Visit: Payer: BC Managed Care – PPO

## 2012-09-06 ENCOUNTER — Ambulatory Visit
Admission: RE | Admit: 2012-09-06 | Discharge: 2012-09-06 | Disposition: A | Discharge status: Home / Self Care | Payer: BC Managed Care – PPO | Source: Ambulatory Visit | Attending: Radiation Oncology | Admitting: Radiation Oncology

## 2012-09-07 ENCOUNTER — Encounter: Payer: Self-pay | Admitting: Radiation Oncology

## 2012-09-07 ENCOUNTER — Ambulatory Visit
Admission: RE | Admit: 2012-09-07 | Discharge: 2012-09-07 | Disposition: A | Discharge status: Home / Self Care | Payer: BC Managed Care – PPO | Source: Ambulatory Visit | Attending: Radiation Oncology | Admitting: Radiation Oncology

## 2012-09-07 VITALS — BP 136/62 | HR 96 | Resp 18 | Wt 109.0 lb

## 2012-09-07 DIAGNOSIS — C210 Malignant neoplasm of anus, unspecified: Secondary | ICD-10-CM

## 2012-09-07 NOTE — Progress Notes (Signed)
Completed final radiation treatment today. Being pushed in wheelchair. Portable oxygen therapy 4 liters via nasal cannula noted. Reports nausea but, denies taking an antiemetic. Denies emesis. Denies diarrhea. Reports mild rectal irritation managed with biafine. Denies burning with urination. Denies headache or dizziness. Reports decreased appetite. Weight stable.

## 2012-09-15 ENCOUNTER — Telehealth: Payer: Self-pay | Admitting: *Deleted

## 2012-09-15 NOTE — Telephone Encounter (Signed)
Asking to confirm "out of work" date and when she was able to return/if she has? When does MD anticipate her return? Forwarded this request to managed care department.

## 2012-09-20 NOTE — Progress Notes (Signed)
  Radiation Oncology         [phone] ________________________________  Name: CAYLAN CHENARD MRN: [id number]  Date: 09/07/2012  DOB: Jun 19, 1955  End of Treatment Note  Diagnosis:   SCC of anal canal     Indication for treatment:  curative       Radiation treatment dates:   06/28/12 - 09/07/12  Site/dose:   The patient was treated to the high-dose region to 54 Gy at 1.8 Gy per fraction. This was carried out using IMRT and daily image guidance on our Tomotherapy unit.  Narrative: The patient tolerated radiation treatment relatively well.   The patient experienced some skin irritation as expected. The patient miised some appointment due to other issues, and we discussed on multiple occasions the potential negative impact of delays on treatment.  Plan: The patient has completed radiation treatment. The patient will return to radiation oncology clinic for routine followup in one month. I advised the patient to call or return sooner if they have any questions or concerns related to their recovery or treatment. ________________________________  Radene Gunning, M.D., Ph.D.

## 2012-10-06 ENCOUNTER — Encounter: Payer: Self-pay | Admitting: Oncology

## 2012-10-07 ENCOUNTER — Ambulatory Visit: Payer: BC Managed Care – PPO | Admitting: Radiation Oncology

## 2012-10-07 ENCOUNTER — Ambulatory Visit (INDEPENDENT_AMBULATORY_CARE_PROVIDER_SITE_OTHER): Payer: BC Managed Care – PPO | Admitting: General Surgery

## 2012-10-11 ENCOUNTER — Telehealth: Payer: Self-pay | Admitting: Oncology

## 2012-10-11 ENCOUNTER — Ambulatory Visit: Payer: BC Managed Care – PPO | Admitting: Oncology

## 2012-10-11 ENCOUNTER — Ambulatory Visit
Admission: RE | Admit: 2012-10-11 | Payer: BC Managed Care – PPO | Source: Ambulatory Visit | Admitting: Radiation Oncology

## 2012-10-11 NOTE — Telephone Encounter (Signed)
Pt called and r/s appt for today to December 2014 , nurse notified

## 2012-10-18 ENCOUNTER — Telehealth: Payer: Self-pay | Admitting: Gynecologic Oncology

## 2012-10-18 NOTE — Telephone Encounter (Signed)
Consult Note: Gyn-Onc   CC:  Vulvar/Anal cancer  Assessment/Plan:  [name]  is a 57 y.o.  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  [name] has significant pulmonary compromise.  Findings are suspicious for a vulvar cancer with extension into the vagina and rectum. It's very difficult to determine whether or not this is a primary anal versus vulvar cancer.   She is receiving  chemoradiation with use of cis-platinum as a sensitizing agent..  This treatment course has been poorly tolerated. Approximately 7 radiotherapy cycles remain. There has been a very impressive response to this treatment. There is no visible residual disease although on rectal examination there is firmness within the rectovaginal septum. The patient reports intermittent evidence of fecal material within the vagina. None was appreciated at this visit. She was counseled that if the fistula remains her options would be limiting passage of fecal material into the vagina by making stool is firm as possible or a permanent diverting colostomy. However given her medical comorbidities preoperative clearance for this procedure may be challenging.   HPI: This is a 57 y.o. who gives a history of recurrent labial cyst for several months treated with Keflex. Patient states that biopsies were obtained but no malignancy was identified. She presented with fecal discharge from the vagina on 05/11/2012 she underwent colonoscopy was noted to have an anterior anal mass. 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  Other findings of colonoscopy were that of diverticulosis and a few small polypoid flat-shaped polyps.   A PET scan 06/16/2012. : There is intense hypermetabolic mass-like thickening surrounding the distal rectum and extending to the anus. This extends from approximately S1 inferiorly to the level of the anal verge. The circumferential mass-like thickening measures up to  2.5 cm in single wall thickness with SUV max = 19.8. There is a hypermetabolic right inguinal lymph node measuring 13 mm (image 226) with SUV max = 13.1. Smaller hypermetabolic left inguinal lymph (161) measuring 10 mm.  Skeleton: No focal hypermetabolic activity to suggest skeletal metastasis. She has received XBRT with curative intent  With CDDP sensitization that has been poorly tolerated.  Radiation treatment dates: 06/28/12 - 09/07/12  Sherry Bautista was treated to the high-dose region to 54 Gy at 1.8 Gy per fraction. This was carried out using IMRT and daily image guidance  Social Hx:   History   Social History  . Marital Status: Divorced    Spouse Name: N/A    Number of Children: 0  . Years of Education: N/A   Occupational History  . [employer]    Social History Main Topics  . Smoking status: Current Every Day Smoker -- 1.00 packs/day for 30 years    Types: Cigarettes  . Smokeless tobacco: Never Used     Comment: started smoking at age 81.  Currently smoking 1ppd, smoking cessation info given  . Alcohol Use: No     Comment: very rarely  . Drug Use: No     Comment: marijuana yrs ago  . Sexual Activity: No     Comment: menarche age 87   Other Topics Concern  . Not on file   Social History Narrative   Divorced-lives alone   Has #2 dogs and #2 cats   No children   Formerly worked at [employer]   Continuous oxygen 3-4 l/min [employer] [facility] per [facility]    Past Surgical Hx:  Past Surgical History  Procedure Laterality Date  . Nasal sinus surgery    . Tonsillectomy    . Knee surgery  2003    cartlage repair  . Breast lumpectomy Left 15-20 yrs ago    benign cyst  . Rectal abscess removal    . Colonoscopy N/A 05/31/2012    Procedure: COLONOSCOPY;  Surgeon: Romie Levee, MD;  Location: [facility];  Service: Endoscopy;  Laterality: N/A;  . Cardiac catherization      no blockages    Past Medical Hx:  Past Medical History  Diagnosis Date  . Tobacco use disorder    . Depression   . Anxiety   . Allergic rhinitis     allergy shots  w/o significant response  . Hyperglycemia   . COPD (chronic obstructive pulmonary disease)   . Cancer 05/11/12    rectal cancer, XRT/ChemorX per [facility]  . Bartholin cyst 07/2011  . Recto-vaginal fistula   . Osteopenia   . History of radiation therapy 06/28/2012-09/07/2012    54 Gy to rectal area    Past Gynecological History:  Gravida 1 para 0 menarche at age 16 with regular menses until 2-3 years ago history of cervical dysplasia treated with cryotherapy. Patient states that recent Pap tests have been within normal limits No LMP recorded. Patient is postmenopausal.  Family Hx:  Family History  Problem Relation Age of Onset  . Lung cancer Father   . COPD Father   . Hyperlipidemia Father   . Cancer Father   . Factor IX deficiency Mother     PTE  . Asthma Sister   . Hypertension Sister   . Heart attack Maternal Grandmother   . Heart attack Maternal Grandfather   . Heart attack Paternal Grandmother     Review of Systems:  Constitutional  Feels well Cardiovascular  No chest pain, shortness of breath, or edema  Pulmonary  No cough or wheeze. Continues to smoke Gastro Intestinal  No nausea, vomitting, or diarrhoea. No abdominal pain, reports intermittent diarrhea Genito Urinary  No frequency, urgency, dysuria, reports some mucus-like discharge in the vagina sometimes accompanied by frank fecal material.  Vulvar discomfort has improved markedly  Musculo Skeletal  No myalgia, arthralgia, joint swelling or pain  Neurologic  No weakness, numbness, change in gait,  Psychology  No depression, anxiety, insomnia.   Vitals: BP 120/58  Pulse 104  Temp(Src) 98.3 F (36.8 C)  Resp 24  Ht 5' 4.57" (1.64 m)  Wt 109 lb 12.8 oz (49.805 kg)  BMI 18.52 kg/m2    Physical Exam: WD in NAD, thin cachectic with nasal oxygen cannula Neck  Supple NROM, without any enlargements.  Lymph Node Survey No cervical  supraclavicular adenopathy.  A right 1 cm inguinal lymph node is palpable  Cardiovascular  Pulse normal rate, regularity and rhythm. S1 and S2 normal.  Lungs  Clear to auscultation bilateraly, distant breath sounds bilaterally involving the lower lobes  Psychiatry  Alert and oriented to person, place, and time  Abdomen  Normoactive bowel sounds, abdomen soft, non-tender. Back No CVA tenderness Genito Urinary  Vulva/vagina: No gross residual disease is visible either within the vulva or posterior distal vagina.  Significant erythema of the external genitalia.  No fecal material noted within the vagina  Cervix: Palpably normal    Uterus: Unable to assess  Rectal  Firmess/thickening is noted in the distal rectovaginal septum but not grossly nodular as before. Condyloma has resolved.  Extremities  No bilateral cyanosis, clubbing or edema.    Rocky Gladden,  [facility], MD, PhD 10/18/2012, 8:34 PM

## 2012-10-19 ENCOUNTER — Encounter: Payer: Self-pay | Admitting: Gynecologic Oncology

## 2012-10-19 ENCOUNTER — Ambulatory Visit: Payer: BC Managed Care – PPO | Attending: Gynecologic Oncology | Admitting: Gynecologic Oncology

## 2012-10-19 VITALS — BP 111/71 | HR 108 | Temp 98.8°F | Resp 18 | Ht 64.0 in | Wt 106.4 lb

## 2012-10-19 DIAGNOSIS — M899 Disorder of bone, unspecified: Secondary | ICD-10-CM | POA: Insufficient documentation

## 2012-10-19 DIAGNOSIS — C519 Malignant neoplasm of vulva, unspecified: Secondary | ICD-10-CM

## 2012-10-19 DIAGNOSIS — C210 Malignant neoplasm of anus, unspecified: Secondary | ICD-10-CM | POA: Insufficient documentation

## 2012-10-19 DIAGNOSIS — F17200 Nicotine dependence, unspecified, uncomplicated: Secondary | ICD-10-CM | POA: Insufficient documentation

## 2012-10-19 DIAGNOSIS — Z923 Personal history of irradiation: Secondary | ICD-10-CM | POA: Insufficient documentation

## 2012-10-19 DIAGNOSIS — J449 Chronic obstructive pulmonary disease, unspecified: Secondary | ICD-10-CM | POA: Insufficient documentation

## 2012-10-19 DIAGNOSIS — K620 Anal polyp: Secondary | ICD-10-CM | POA: Insufficient documentation

## 2012-10-19 DIAGNOSIS — J4489 Other specified chronic obstructive pulmonary disease: Secondary | ICD-10-CM | POA: Insufficient documentation

## 2012-10-19 DIAGNOSIS — Z9981 Dependence on supplemental oxygen: Secondary | ICD-10-CM | POA: Insufficient documentation

## 2012-10-19 DIAGNOSIS — K5730 Diverticulosis of large intestine without perforation or abscess without bleeding: Secondary | ICD-10-CM | POA: Insufficient documentation

## 2012-10-19 NOTE — Patient Instructions (Addendum)
Plan to follow up in three months or sooner if needed.  Please call for any questions or concerns. 

## 2012-10-19 NOTE — Progress Notes (Signed)
Office Visit Note: Gyn-Onc   CC:  Vulvar/Anal cancer  Assessment/Plan:  [name]  is a 57 y.o.  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  [name] has significant pulmonary compromise.  Findings are suspicious for a vulvar cancer with extension into the vagina and rectum. It's very difficult to determine whether or not this is a primary anal versus vulvar cancer.   She received chemoradiation with use of cis-platinum as a sensitizing agent completed 09/07/2012.  This treatment course was poorly tolerated. There has been a very impressive response to this treatment. There is no visible residual disease. The patient reports intermittent evidence of fecal material within the vagina when her stool is not solid .  Residual right inguinal adenopathy has resolved.  Patient declined the opportunity to enroll in a tobacco cessation program. Advised to use imodium when her stool is soft.  F/U in 3 months   HPI: This is a 57 y.o. who presented with a history of recurrent labial cyst for several months treated with Keflex. Patient states that biopsies were obtained but no malignancy was identified. She presented with fecal discharge from the vagina on 05/11/2012 she underwent colonoscopy was noted to have an anterior anal mass. 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  Other findings of colonoscopy were that of diverticulosis and a few small polypoid flat-shaped polyps. The patient reported fecal discharge from the vagina and discomfort in the vulva area overall.  PET scan 06/16/2012.  There is intense hypermetabolic mass-like thickening surrounding the distal rectum and extending to the anus. This extends from approximately S1 inferiorly to the level of the anal verge. The circumferential mass-like thickening measures up to 2.5 cm in single wall thickness with SUV max = 19.8. There is a hypermetabolic right inguinal lymph node  measuring 13 mm (image 226) with SUV max = 13.1. Smaller hypermetabolic left inguinal lymph (454) measuring 10 mm.  Skeleton: No focal hypermetabolic activity to suggest skeletal metastasis.  It was difficult to determine whether this is vulvar or anal cancer.  The patient has received radiotherapy with CDDP sensitization that began 5/12//2014 and was completed 09/08/2102  Total 54Gy    Social Hx:   History   Social History  . Marital Status: Divorced    Spouse Name: N/A    Number of Children: 0  . Years of Education: N/A   Occupational History  . [employer]    Social History Main Topics  . Smoking status: Current Every Day Smoker -- 1.00 packs/day for 30 years    Types: Cigarettes  . Smokeless tobacco: Never Used     Comment: started smoking at age 37.  Currently smoking 1ppd, smoking cessation info given  . Alcohol Use: No     Comment: very rarely  . Drug Use: No     Comment: marijuana yrs ago  . Sexual Activity: No     Comment: menarche age 12   Other Topics Concern  . Not on file   Social History Narrative   Divorced-lives alone   Has #2 dogs and #2 cats   No children   Formerly worked at [employer]   Continuous oxygen 3-4 l/min [place] per [facility]    Past Surgical Hx:  Past Surgical History  Procedure Laterality Date  . Nasal sinus surgery    . Tonsillectomy    . Knee surgery  2003    cartlage repair  . Breast  lumpectomy Left 15-20 yrs ago    benign cyst  . Rectal abscess removal    . Colonoscopy N/A 05/31/2012    Procedure: COLONOSCOPY;  Surgeon: Romie Levee, MD;  Location: [facility];  Service: Endoscopy;  Laterality: N/A;  . Cardiac catherization      no blockages    Past Medical Hx:  Past Medical History  Diagnosis Date  . Tobacco use disorder   . Depression   . Anxiety   . Allergic rhinitis     allergy shots  w/o significant response  . Hyperglycemia   . COPD (chronic obstructive pulmonary disease)   . Cancer 05/11/12     rectal cancer, XRT/ChemorX per [facility]  . Bartholin cyst 07/2011  . Recto-vaginal fistula   . Osteopenia   . History of radiation therapy 06/28/2012-09/07/2012    54 Gy to rectal area    Past Gynecological History:  Gravida 1 para 0 menarche at age 67 with regular menses until 2-3 years ago history of cervical dysplasia treated with cryotherapy. Patient states that recent Pap tests have been within normal limits No LMP recorded. Patient is postmenopausal.  Family Hx:  Family History  Problem Relation Age of Onset  . Lung cancer Father   . COPD Father   . Hyperlipidemia Father   . Cancer Father   . Factor IX deficiency Mother     PTE  . Asthma Sister   . Hypertension Sister   . Heart attack Maternal Grandmother   . Heart attack Maternal Grandfather   . Heart attack Paternal Grandmother     Review of Systems:  Constitutional  Feels well Cardiovascular  No chest pain, shortness of breath, or edema  Pulmonary  No cough or wheeze. Continues to smoke Gastro Intestinal  No nausea, vomitting, or diarrhoea. No abdominal pain, reports intermittent diarrhea Genito Urinary  No frequency, urgency, dysuria, reports intermittent  frank liquid fecal material in the vulva.    Musculo Skeletal  No myalgia, arthralgia, joint swelling or pain  Neurologic  No weakness, numbness, change in gait,  Psychology  No depression, anxiety, insomnia.   Vitals: BP 111/71  Pulse 108  Temp(Src) 98.8 F (37.1 C) (Oral)  Resp 18  Ht 5\' 4"  (1.626 m)  Wt 106 lb 6.4 oz (48.263 kg)  BMI 18.25 kg/m2  SpO2 97%    Physical Exam: WD in NAD, thin cachectic with nasal oxygen cannula Neck  Supple NROM, without any enlargements.  Lymph Node Survey No cervical supraclavicular or inguinal adenopathy Cardiovascular  Pulse normal rate, regularity and rhythm. S1 and S2 normal.  Lungs  Clear to auscultation bilateraly, distant breath sounds bilaterally involving the lower lobes  Psychiatry  Alert  and oriented to person, place, and time  Abdomen  Normoactive bowel sounds, abdomen soft, non-tender. Back No CVA tenderness Genito Urinary  Vulva/vagina: No gross residual disease is visible either within the vulva or posterior distal vagina.   No fecal material noted within the vagina, no masses or nodularity  Cervix: WNL  Uterus:small, mobile  Rectal  Smooth RV septum.  No masses or nodules.  Extremities  No bilateral cyanosis, clubbing or edema.    Laurette Schimke, MD, PhD 10/19/2012, 10:04 AM

## 2012-10-22 ENCOUNTER — Telehealth: Payer: Self-pay | Admitting: Critical Care Medicine

## 2012-10-22 MED ORDER — LEVOFLOXACIN 500 MG PO TABS: 500.0000 mg | ORAL_TABLET | Freq: Every day | ORAL | Status: DC

## 2012-10-22 MED ORDER — PREDNISONE 10 MG PO TABS: ORAL_TABLET | ORAL | Status: DC

## 2012-10-22 NOTE — Telephone Encounter (Signed)
Call in levaquin 500mg  /d x 5 days Prednisone 10mg  Take 4 for two days three for two days two for two days one for two days #20 Needs OV soon

## 2012-10-22 NOTE — Telephone Encounter (Signed)
I spoke with pt. She c/o heavy chest congestion, cough w/ green phlem, increase SOB w/ activity, chest tx x 2 days. Her esophagus feels tender d/t coughing. Denies any wheezing, no PND, no nasal congestion. She is not taking anything OTC for this. She stated she can't come in for OV bc she does not have her car today bc her nephew had to borrow it. Please advise Dr. Delford Field thanks Last OV 07/15/12 No pending appt. Allergies  Allergen Reactions  . Azithromycin     rash  . Emend [Fosaprepitant Dimeglumine] Anaphylaxis, Shortness Of Breath and Swelling  . Tetanus-Diphtheria Toxoids Td     Redness, swelling, stiffness of arm   . Codeine Sulfate     itching

## 2012-10-22 NOTE — Telephone Encounter (Signed)
Pt is aware of recs. RX has been sent and appt scheduled.

## 2012-10-26 ENCOUNTER — Other Ambulatory Visit: Payer: Self-pay | Admitting: Critical Care Medicine

## 2012-10-26 ENCOUNTER — Telehealth: Payer: Self-pay | Admitting: *Deleted

## 2012-10-26 ENCOUNTER — Other Ambulatory Visit: Payer: Self-pay | Admitting: Radiation Oncology

## 2012-10-26 NOTE — Telephone Encounter (Signed)
Called and left vm for patient to call ,need what pharmacy she is using now, [facility] in [place] or [facility] in [place], waiting call back 1:41 PM

## 2012-10-26 NOTE — Telephone Encounter (Signed)
Called [facility] pharmacy, clarification of rx : Xanax 0.25mg ,take 1 tab by mouth every 8 hours as needed for anxiety,dispense#30 tabs, no refill, spoke with pharmacist Rechell, then called Sherry Bautista home left vm , her rx has been called in to [facility] in [place]  1:55 PM

## 2012-10-28 ENCOUNTER — Encounter: Payer: Self-pay | Admitting: *Deleted

## 2012-10-28 ENCOUNTER — Ambulatory Visit: Payer: BC Managed Care – PPO | Admitting: Radiation Oncology

## 2012-10-28 NOTE — Progress Notes (Signed)
[facility] Psychosocial Distress Screening Clinical Social Work  Clinical Social Work was referred by distress screening protocol.  The patient scored a 10 on the Psychosocial Distress Thermometer which indicates extreme distress. Clinical Social Worker, Doreen Salvage contacted Pt via phone to assess for distress and other psychosocial needs. Pt reported to be coping better now that she has xanax to help when she needs it. She reports to not take it daily, but that "it helps when she does take it." Pt denies other concerns currently other than a cold and plans to follow up with her pulmonologist later this month.      Clinical Social Worker follow up needed: no  Pt aware to contact CSW as needed.   Doreen Salvage, LCSW Clinical Social Worker [facility] Support [facility] Wednesday, Thursday and Friday Phone: [phone] Fax: [phone]

## 2012-11-04 ENCOUNTER — Ambulatory Visit: Payer: BC Managed Care – PPO | Admitting: Radiation Oncology

## 2012-11-09 ENCOUNTER — Telehealth: Payer: Self-pay | Admitting: *Deleted

## 2012-11-09 ENCOUNTER — Ambulatory Visit (INDEPENDENT_AMBULATORY_CARE_PROVIDER_SITE_OTHER): Payer: BC Managed Care – PPO | Admitting: General Surgery

## 2012-11-09 NOTE — Telephone Encounter (Signed)
Message from [place] with [employer]. Asking if Dr. Truett Perna has released pt to return to work. No. Will have no additional information until pt is seen in office. Left message on voicemail at [phone] # [phone]. Pt case ID [id number].

## 2012-11-12 ENCOUNTER — Telehealth: Payer: Self-pay | Admitting: Critical Care Medicine

## 2012-11-12 MED ORDER — ALBUTEROL SULFATE HFA 108 (90 BASE) MCG/ACT IN AERS: INHALATION_SPRAY | RESPIRATORY_TRACT | Status: AC

## 2012-11-12 MED ORDER — PAROXETINE HCL ER 25 MG PO TB24: 25.0000 mg | ORAL_TABLET | Freq: Two times a day (BID) | ORAL | Status: AC

## 2012-11-12 NOTE — Telephone Encounter (Signed)
[name], NP at 07/09/2012 3:42 PM   Status: Signed            I confirmed that the pt has been dismissed from [place] primary care  ---  This is documented in EPIC.  Paxil was last refilled by [facility] 03/04/12 #60 x 3 refills.  Dr. Delford Field please advise if okay to refill thanks

## 2012-11-12 NOTE — Telephone Encounter (Signed)
i am ok to refill paxil

## 2012-11-12 NOTE — Telephone Encounter (Signed)
Refills sent and pt is aware. Jennifer Castillo, CMA  

## 2012-11-15 ENCOUNTER — Ambulatory Visit (INDEPENDENT_AMBULATORY_CARE_PROVIDER_SITE_OTHER): Payer: BC Managed Care – PPO | Admitting: Critical Care Medicine

## 2012-11-15 ENCOUNTER — Encounter: Payer: Self-pay | Admitting: Critical Care Medicine

## 2012-11-15 VITALS — BP 108/78 | HR 96 | Temp 98.2°F | Ht 64.0 in | Wt 107.5 lb

## 2012-11-15 DIAGNOSIS — J449 Chronic obstructive pulmonary disease, unspecified: Secondary | ICD-10-CM

## 2012-11-15 DIAGNOSIS — Z23 Encounter for immunization: Secondary | ICD-10-CM

## 2012-11-15 DIAGNOSIS — F17200 Nicotine dependence, unspecified, uncomplicated: Secondary | ICD-10-CM

## 2012-11-15 NOTE — Assessment & Plan Note (Signed)
Gold C. COPD with frequent exacerbations stable at this time Plan Maintain inhaled medications as prescribed Administer flu vaccine

## 2012-11-15 NOTE — Assessment & Plan Note (Signed)
The patient continues to actively smoke despite multiple different smoking cessation interventions

## 2012-11-15 NOTE — Patient Instructions (Addendum)
A flu vaccine was given No change in medications Return 4 months

## 2012-11-15 NOTE — Progress Notes (Signed)
Subjective:    Patient ID: Sherry Bautista, female    DOB: [birth year], 57 y.o.   MRN: [id number]  HPI  57 y.o. .WF  With known hx Dx of copd, active smoker -O2 dependent    11/15/2012 Chief Complaint  Patient presents with  . 4 month follow up    Breathing may be a little worse since last OV.  Has increased DOE, some wheezing, chest tightness in the afternoons, and prod cough with thick, creamy mucus.    Notes more dyspnea and tightness.  Seems worse in the PM.  Notes thick creamy mucus.  Notes mucus is thicker than usual.  Notes only chest tightness.  Notes mild wheezing. Now on 3L oxygen rest, 4L exertion   Review of Systems  Constitutional:   No  weight loss, night sweats,  Fevers, chills, fatigue, lassitude. HEENT:   No headaches,  Difficulty swallowing,  Tooth/dental problems,  Sore throat,                No sneezing, itching, ear ache, nasal congestion, post nasal drip,   CV:  No chest pain,  Orthopnea, PND, swelling in lower extremities, anasarca, dizziness, palpitations  GI  No heartburn, indigestion, abdominal pain, nausea, vomiting, diarrhea, change in bowel habits, loss of appetite  Resp: ++ shortness of breath with exertion not  at rest.  No excess mucus, no productive cough,  +++ non-productive cough,  No coughing up of blood.  No change in color of mucus.  No wheezing.  No chest wall deformity  Skin: no rash or lesions.  GU: no dysuria, change in color of urine, no urgency or frequency.  No flank pain.  MS:  No joint pain or swelling.  No decreased range of motion.  No back pain.  Psych:  No change in mood or affect. No depression or anxiety.  No memory loss.     Objective:   Physical Exam  Filed Vitals:   11/15/12 0939  BP: 108/78  Pulse: 96  Temp: 98.2 F (36.8 C)  TempSrc: Oral  Height: 5\' 4"  (1.626 m)  Weight: 107 lb 8 oz (48.762 kg)  SpO2: 100%    Gen: Pleasant, well-nourished, in no distress,  normal affect  ENT: No lesions,  mouth clear,   oropharynx clear, no postnasal drip  Neck: No JVD, no TMG, no carotid bruits  Lungs: No use of accessory muscles, no dullness to percussion, distant breath sounds   Cardiovascular: RRR, heart sounds normal, no murmur or gallops, no peripheral edema  Abdomen: soft and NT, no HSM,  BS normal  Musculoskeletal: No deformities, no cyanosis or clubbing  Neuro: alert, non focal  Skin: Warm, no lesions or rashes  No results found.        Assessment & Plan:   Obstructive chronic bronchitis without exacerbation gold C. Gold C. COPD with frequent exacerbations stable at this time Plan Maintain inhaled medications as prescribed Administer flu vaccine  Tobacco use disorder The patient continues to actively smoke despite multiple different smoking cessation interventions    Updated Medication List Outpatient Encounter Prescriptions as of 11/15/2012  Medication Sig Dispense Refill  . albuterol (PROAIR HFA) 108 (90 BASE) MCG/ACT inhaler INHALE 2 PUFFS INTO THE LUNGS EVERY 4 HOURS AS NEEDED  8.5 g  5  . ALPRAZolam (XANAX) 0.25 MG tablet as needed.       . AMBULATORY NON FORMULARY MEDICATION Oxygen: 3 liters at rest and 4 liters with activity       .  buPROPion (WELLBUTRIN SR) 150 MG 12 hr tablet Take 1 tablet (150 mg total) by mouth 2 (two) times daily.  60 tablet  2  . ibuprofen (ADVIL) 200 MG tablet Take 1-2 tablets (200-400 mg total) by mouth every 6 (six) hours as needed for pain.  30 tablet  0  . mometasone-formoterol (DULERA) 200-5 MCG/ACT AERO Inhale 2 puffs into the lungs 2 (two) times daily.  1 Inhaler  11  . PARoxetine (PAXIL-CR) 25 MG 24 hr tablet Take 1 tablet (25 mg total) by mouth 2 (two) times daily.  60 tablet  3  . tiotropium (SPIRIVA) 18 MCG inhalation capsule Place 1 capsule (18 mcg total) into inhaler and inhale daily.  30 capsule  11  . [DISCONTINUED] albuterol (PROVENTIL HFA;VENTOLIN HFA) 108 (90 BASE) MCG/ACT inhaler Inhale 2 puffs into the lungs every 4 (four) hours  as needed for wheezing or shortness of breath.      . [DISCONTINUED] diphenhydrAMINE (BENADRYL) 25 MG tablet Take 1 tablet (25 mg total) by mouth every 6 (six) hours as needed for itching.  30 tablet  0  . [DISCONTINUED] levofloxacin (LEVAQUIN) 500 MG tablet Take 1 tablet (500 mg total) by mouth daily.  5 tablet  0  . [DISCONTINUED] loperamide (IMODIUM) 2 MG capsule Take 2 mg by mouth 4 (four) times daily as needed for diarrhea or loose stools.      . [DISCONTINUED] oxyCODONE (OXY IR/ROXICODONE) 5 MG immediate release tablet Take 5 mg by mouth every 6 (six) hours as needed for pain.      . [DISCONTINUED] predniSONE (DELTASONE) 10 MG tablet Take 4 tabs daily x 2 days, 3 tabs daily x 2 days, 2 tabs daily x 2 days, 1 tab daily x 2 days  20 tablet  0  . [DISCONTINUED] prochlorperazine (COMPAZINE) 10 MG tablet Take 1 tablet (10 mg total) by mouth every 6 (six) hours as needed (nausea).  60 tablet  1   No facility-administered encounter medications on file as of 11/15/2012.

## 2012-12-02 ENCOUNTER — Ambulatory Visit
Admission: RE | Admit: 2012-12-02 | Payer: BC Managed Care – PPO | Source: Ambulatory Visit | Admitting: Radiation Oncology

## 2012-12-13 ENCOUNTER — Telehealth: Payer: Self-pay | Admitting: *Deleted

## 2012-12-13 NOTE — Telephone Encounter (Signed)
Asking if patient has been released to return to work yet? If so, when was return to work date?

## 2012-12-14 ENCOUNTER — Ambulatory Visit (INDEPENDENT_AMBULATORY_CARE_PROVIDER_SITE_OTHER): Payer: BC Managed Care – PPO | Admitting: General Surgery

## 2012-12-17 NOTE — Telephone Encounter (Signed)
Left VM for patient to call office and ask for Dr. Kalman Drape nurse. Need to talk about her status and work.

## 2012-12-20 ENCOUNTER — Telehealth: Payer: Self-pay | Admitting: *Deleted

## 2012-12-20 NOTE — Telephone Encounter (Signed)
Called pt, she was unaware [employer] was requesting additional info on her status. Pt states she has called the schedulers for an appt with Dr. Truett Perna and has not received a call back. Per Dr. Truett Perna: should be seen sooner than Dec. Will work pt in for appt.

## 2012-12-21 NOTE — Telephone Encounter (Signed)
Left message on voicemail for pt to call office. Availability for 11/7 at 0830 with Dr. Truett Perna. Orders entered for schedulers to arrange appt.

## 2012-12-22 ENCOUNTER — Telehealth: Payer: Self-pay | Admitting: *Deleted

## 2012-12-22 NOTE — Telephone Encounter (Signed)
Lm gv appt for 12/24/12 @ 8:30am...td

## 2012-12-24 ENCOUNTER — Telehealth: Payer: Self-pay | Admitting: Oncology

## 2012-12-24 ENCOUNTER — Ambulatory Visit (HOSPITAL_BASED_OUTPATIENT_CLINIC_OR_DEPARTMENT_OTHER): Payer: BC Managed Care – PPO | Admitting: Oncology

## 2012-12-24 VITALS — BP 156/65 | HR 107 | Temp 97.8°F | Resp 18 | Ht 64.0 in | Wt 105.8 lb

## 2012-12-24 DIAGNOSIS — C519 Malignant neoplasm of vulva, unspecified: Secondary | ICD-10-CM

## 2012-12-24 DIAGNOSIS — G609 Hereditary and idiopathic neuropathy, unspecified: Secondary | ICD-10-CM

## 2012-12-24 DIAGNOSIS — C210 Malignant neoplasm of anus, unspecified: Secondary | ICD-10-CM

## 2012-12-24 NOTE — Progress Notes (Signed)
   [facility]    OFFICE PROGRESS NOTE   INTERVAL HISTORY:   She returns for followup of anal versus vulvar cancer. She completed radiation 09/07/2012. The rectal pain has resolved. There is a tiny lymph node in the right inguinal region. She saw Dr. Nelly Rout 10/19/2012 and there was no evidence of residual disease on exam. She is scheduled for an exam with Dr. Maisie Fus in approximately 2 weeks. She continues to pass stool from the vagina.  Stable dyspnea, maintained on oxygen.    Objective:  Vital signs in last 24 hours:  Blood pressure 156/65, pulse 107, temperature 97.8 F (36.6 C), temperature source Oral, resp. rate 18, height 5\' 4"  (1.626 m), weight 105 lb 12.8 oz (47.991 kg).    HEENT: Neck without mass Lymphatics: No cervical, supraclavicular, axillary, or left inguinal nodes.? Pea-sized mobile medial right inguinal node near the pubic bone Resp: Distant breath sounds, no respiratory distress Cardio: Regular rate and rhythm GI: No hepatosplenomegaly, nontender Vascular: No leg edema   Skin: radiation hyperpigmentation at the groin and perineum. External examination at the anal verge and perineum reveals no evidence of tumor. Brown stool at the upper labia.     Medications: I have reviewed the patient's current medications.  Assessment/Plan: 1.Vulvar/Anal cancer, locally advanced, staging PET scan revealed hypermetabolic circumferential thickening at the distal rectum/anus, bilateral hypermetabolic inguinal nodes and no evidence of distant metastatic disease.  -Initiation of weekly cisplatin and concurrent radiation 06/28/2012. She completed the sixth weekly cisplatin on 08/09/2012. Radiation completed 09/07/2012. 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  3. Pain secondary to the locally advanced anal cancer -resolved.  4. COPD-oxygen dependent.  5. Hyperplastic polyps on colonoscopy 05/31/2012.  6. Nausea following cisplatin chemotherapy-improved with  prophylactic Decadron following chemotherapy.  7. Allergic reaction with respiratory failure following Emend premedication on 07/07/2012-the respiratory failure improved with Solu-Medrol, bronchodilator therapy, and epinephrine.  8. 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 The cisplatin was dose reduced with week 6.    Disposition:  She is now in clinical remission from the vulvar/anal cancer. She continues to have evidence of a rectovaginal fistula. She will see Dr. Maisie Fus for an examination and to discuss treatment of the fistula. She will see Dr.s  Mitzi Hansen and Nelly Rout in December.  We did not order  imaging studies. She will return for an office visit here in 4 months.     Thornton Papas, MD  12/24/2012  2:10 PM

## 2012-12-24 NOTE — Telephone Encounter (Signed)
gv and printed appt sched and avs forpt for March 2015  °

## 2012-12-28 ENCOUNTER — Telehealth: Payer: Self-pay | Admitting: *Deleted

## 2012-12-28 NOTE — Telephone Encounter (Signed)
Left VM for [employer] representative that scanned records show she was put out of work on 07/18/12 by Dr. Mitzi Hansen, not Dr. Truett Perna. Suggested she contact managed care department to assist her further. Also suggested she contact patient about this issue, since last conversation we had with patient she was not even aware [employer] was following up on her return to work.

## 2012-12-28 NOTE — Telephone Encounter (Signed)
VM left requesting return to work date on patient. Her records show she was put out of work on 06/07/12, according to a notice she has that was sent to office on 09/21/12.

## 2013-01-04 ENCOUNTER — Ambulatory Visit (INDEPENDENT_AMBULATORY_CARE_PROVIDER_SITE_OTHER): Payer: BC Managed Care – PPO | Admitting: General Surgery

## 2013-01-04 ENCOUNTER — Encounter (INDEPENDENT_AMBULATORY_CARE_PROVIDER_SITE_OTHER): Payer: Self-pay | Admitting: General Surgery

## 2013-01-04 VITALS — BP 130/70 | HR 90 | Temp 97.3°F | Resp 30 | Ht 64.0 in | Wt 107.5 lb

## 2013-01-04 DIAGNOSIS — N824 Other female intestinal-genital tract fistulae: Secondary | ICD-10-CM

## 2013-01-04 DIAGNOSIS — N823 Fistula of vagina to large intestine: Secondary | ICD-10-CM

## 2013-01-04 NOTE — Patient Instructions (Signed)

## 2013-01-04 NOTE — Progress Notes (Signed)
Sherry Bautista is a 57 y.o. female who is here for a follow up visit regarding her rectovaginal fistula.  She is s/p chemoRT for a SCC.  She reports stool per vagina 2-3 times a week.  She denies bleeding.  Objective: Filed Vitals:   01/04/13 1117  BP: 130/70  Pulse: 90  Temp: 97.3 F (36.3 C)  Resp: 30    General appearance: alert and cooperative GI: normal findings: soft, non-tender and no ing lymphadenopathy DRE: RVF, 2 small fistulous tracts in L labia   Assessment and Plan: Sherry Bautista is a 57 y.o. F smoker with COPD and O2 dependence with several rectovaginal fistulas and and an anterior defect rectally.  She has several options. She can continue with her current therapy possibly trying to add in a fiber supplement to help bulk up per stools so that it is more difficult for them to pass into the fistula tracts. This may be difficult given her slight anal stenosis. If the fistula tracks were becoming infected or causing pain or fevers, I would recommend seton placement. It doesn't sound like this is the case. She did also undergo a diverting colostomy, which would alleviate stool drainage but not necessarily mucous drainage.  Lastly I recommended that she can be referred to a tertiary care center for possible muscle transposition flap.  That would most likely recommend a diverting colostomy and for her to stop smoking prior to performing the procedure. Upon hearing all of this the patient has decided to continue with her current management. She will call me if her condition changes or she would like to proceed with further treatment. I think this is reasonable at this time, given her symptoms.    Vanita Panda, MD [facility], [place] [phone]

## 2013-01-07 ENCOUNTER — Other Ambulatory Visit: Payer: Self-pay | Admitting: Critical Care Medicine

## 2013-01-07 NOTE — Telephone Encounter (Signed)
Dulera 200 rx was sent to pharm on 07/15/12 # 1 x 11. [employer], spoke with [place].  Was advised they did receive the dulera 200 rx in May 2014.  Nothing further is needed.

## 2013-01-26 ENCOUNTER — Encounter: Payer: Self-pay | Admitting: Oncology

## 2013-01-26 NOTE — Progress Notes (Signed)
Mailed [employer] forms to patient.

## 2013-01-27 ENCOUNTER — Ambulatory Visit: Payer: BC Managed Care – PPO | Admitting: Oncology

## 2013-02-03 ENCOUNTER — Ambulatory Visit
Admission: RE | Admit: 2013-02-03 | Payer: BC Managed Care – PPO | Source: Ambulatory Visit | Admitting: Radiation Oncology

## 2013-02-03 ENCOUNTER — Ambulatory Visit: Payer: BC Managed Care – PPO | Admitting: Gynecologic Oncology

## 2013-02-16 ENCOUNTER — Telehealth: Payer: Self-pay

## 2013-02-16 ENCOUNTER — Other Ambulatory Visit: Payer: Self-pay | Admitting: Radiation Oncology

## 2013-02-16 ENCOUNTER — Telehealth: Payer: Self-pay | Admitting: *Deleted

## 2013-02-16 NOTE — Telephone Encounter (Signed)
Patient returned call.Informed prescription for xanax called into  [facility] on [address]. [employer]

## 2013-02-16 NOTE — Telephone Encounter (Signed)
Script called into [facility]. To [facility].

## 2013-02-16 NOTE — Telephone Encounter (Signed)
error 

## 2013-02-16 NOTE — Telephone Encounter (Signed)
Left message for patient to return my call to inform patient prescription for xanax(alprazolam) has been electronically sent to [facility].I will call pharmacy to confirm receipt of script.

## 2013-02-24 ENCOUNTER — Ambulatory Visit
Admission: RE | Admit: 2013-02-24 | Payer: BC Managed Care – PPO | Source: Ambulatory Visit | Admitting: Radiation Oncology

## 2013-02-24 ENCOUNTER — Ambulatory Visit: Payer: BC Managed Care – PPO | Attending: Gynecologic Oncology | Admitting: Gynecologic Oncology

## 2013-02-24 ENCOUNTER — Telehealth: Payer: Self-pay | Admitting: *Deleted

## 2013-02-24 NOTE — Telephone Encounter (Signed)
Home,left vm  Missed her 8am appt f/u, needs to call to reschedule at [phone] 9:10 AM

## 2013-02-24 NOTE — Telephone Encounter (Signed)
Patient no show will have reschedule 8:33 AM

## 2013-02-28 ENCOUNTER — Telehealth: Payer: Self-pay | Admitting: *Deleted

## 2013-02-28 NOTE — Telephone Encounter (Signed)
Pt was called to reschedule missed follow up appointment on 02/24/2013. A message was left asking Ms. Mano to please call the office at [place] to reschedule her appt with Dr. Skeet Latch.

## 2013-03-21 ENCOUNTER — Telehealth: Payer: Self-pay | Admitting: *Deleted

## 2013-03-21 NOTE — Telephone Encounter (Signed)
Pt called with concerns states " I think my fistula is infected, I'm not passing any stool, it's raw and swollen down there, I cant sit to pee." Pt denies fevers, and advised she has been trying to manage it for about 1week. Reviewed last progress note from Dr. Ammie Dalton who advised pt follow up with Dr.Thomas for treatment of fistula. Recommended pt call Dr. Marcello Moores office. Pt verbalized understanding.

## 2013-04-04 ENCOUNTER — Telehealth: Payer: Self-pay | Admitting: *Deleted

## 2013-04-04 NOTE — Telephone Encounter (Signed)
Pt called states I'm having pain around the fistula's, the area is raw and painful. Pt advised she did not follow up with Dr. Marcello Moores after last phone encounter on 2/2. Recommended pt call Dr. Marcello Moores office to discuss concerns about Fistula's and to call our office back with an update. Pt verbalized she would call Dr. Marcello Moores office to set up an appt for further evaluation of her concerns and call us back with additional information.

## 2013-04-07 ENCOUNTER — Ambulatory Visit (INDEPENDENT_AMBULATORY_CARE_PROVIDER_SITE_OTHER): Payer: BC Managed Care – PPO | Admitting: General Surgery

## 2013-04-18 ENCOUNTER — Encounter (INDEPENDENT_AMBULATORY_CARE_PROVIDER_SITE_OTHER): Payer: BC Managed Care – PPO | Admitting: General Surgery

## 2013-04-19 ENCOUNTER — Telehealth: Payer: Self-pay | Admitting: *Deleted

## 2013-04-19 NOTE — Telephone Encounter (Signed)
Message from pt requesting to cancel 3/4 appt. Reviewed with Lattie Haw, NP. Will reschedule for 1 mo.

## 2013-04-20 ENCOUNTER — Telehealth: Payer: Self-pay | Admitting: Oncology

## 2013-04-20 ENCOUNTER — Ambulatory Visit: Payer: BC Managed Care – PPO | Admitting: Nurse Practitioner

## 2013-04-20 NOTE — Telephone Encounter (Signed)
Called pt left message regarding appt for 4/2, mailed appt

## 2013-04-26 ENCOUNTER — Ambulatory Visit: Payer: BC Managed Care – PPO | Admitting: Critical Care Medicine

## 2013-04-26 ENCOUNTER — Encounter (INDEPENDENT_AMBULATORY_CARE_PROVIDER_SITE_OTHER): Payer: Self-pay | Admitting: General Surgery

## 2013-04-26 ENCOUNTER — Ambulatory Visit (INDEPENDENT_AMBULATORY_CARE_PROVIDER_SITE_OTHER): Payer: BC Managed Care – PPO | Admitting: General Surgery

## 2013-04-26 VITALS — BP 132/82 | HR 82 | Temp 98.2°F | Resp 16 | Ht 64.0 in | Wt 98.0 lb

## 2013-04-26 DIAGNOSIS — N9489 Other specified conditions associated with female genital organs and menstrual cycle: Secondary | ICD-10-CM

## 2013-04-26 DIAGNOSIS — C44529 Squamous cell carcinoma of skin of other part of trunk: Secondary | ICD-10-CM

## 2013-04-26 DIAGNOSIS — N9089 Other specified noninflammatory disorders of vulva and perineum: Secondary | ICD-10-CM

## 2013-04-26 MED ORDER — OXYCODONE HCL 5 MG PO TABS: 5.0000 mg | ORAL_TABLET | ORAL | Status: DC | PRN

## 2013-04-26 NOTE — Progress Notes (Signed)
Sherry Bautista is a 58 y.o. female who is here for a follow up visit regarding her rectovaginal fistula.  She reports new pain.  She is taking Advil on a continuous basis for this.  She is having periodic drainage and more stool per vagina.  Cancer history: 1.Vulvar/Anal cancer, locally advanced, staging PET scan revealed hypermetabolic circumferential thickening at the distal rectum/anus, bilateral hypermetabolic inguinal nodes and no evidence of distant metastatic disease.  -Initiation of weekly cisplatin and concurrent radiation 06/28/2012. She completed the sixth weekly cisplatin on 08/09/2012. Radiation completed 09/07/2012.  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  3. Pain secondary to the locally advanced anal cancer -resolved.  4. COPD-oxygen dependent.  5. Hyperplastic polyps on colonoscopy 05/31/2012.  6. Nausea following cisplatin chemotherapy-improved with prophylactic Decadron following chemotherapy.  7. Allergic reaction with respiratory failure following Emend premedication on 07/07/2012-the respiratory failure improved with Solu-Medrol, bronchodilator therapy, and epinephrine.  8. 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 The cisplatin was dose reduced with week 6.   Objective: Filed Vitals:   04/26/13 0923  BP: 132/82  Pulse: 82  Temp: 98.2 F (36.8 C)  Resp: 16    General appearance: alert and cooperative GI: normal findings: soft, non-tender she has an open vulvar mass extending to approximately mid vagina. Digital rectal exam reveals the rectovaginal fistula the entire anal canal and rectum are soft with no signs of recurrent mass  Procedure: biopsy Surgeon: Marcello Moores Assistant: Morris After the risks and benefits were explained, verbal consent was obtained for above procedure  Anesthesia: none Diagnosis: vulvar mass Findings: Local anesthesia was applied using lidocaine jelly. A scraping of the vulvar mass was obtained using forceps. This was sent to  pathology for further examination. Hemostasis was achieved using direct pressure and silver nitrate. The patient tolerated the procedure well.  Assessment and Plan:  Sherry Bautista is a 58 y.o. F smoker with COPD and O2 dependence I now appears to have a recurrence of her tumor. There is not really any recurrence in the anal canal and it appears to be all related to the vulva and vagina. A biopsy was taken in the office under local anesthesia to confirm my suspicion of recurrent cancer. She is scheduled to see Dr. Skeet Latch on the 26th. Given her significant pain, I prescribed her prescription for oxycodone until she can get to see Dr. Skeet Latch and discuss a more permanent pain management regimen. I will defer any surgical management to her. I will see the patient back as needed.  Rosario Adie, MD [facility], [place]

## 2013-04-26 NOTE — Patient Instructions (Signed)
We will call you with the results of your biopsy.  Keep her scheduled appointment with Dr. Skeet Latch on the 26th.

## 2013-04-27 ENCOUNTER — Telehealth (INDEPENDENT_AMBULATORY_CARE_PROVIDER_SITE_OTHER): Payer: Self-pay | Admitting: General Surgery

## 2013-04-27 NOTE — Telephone Encounter (Signed)
called patient to discuss biopsy results. No answer, left message for patient to call back.

## 2013-04-28 NOTE — Telephone Encounter (Signed)
Pt returned call. Msg paged to Dr Marcello Moores with return call phone #.

## 2013-04-29 ENCOUNTER — Telehealth: Payer: Self-pay | Admitting: *Deleted

## 2013-04-29 ENCOUNTER — Other Ambulatory Visit: Payer: Self-pay | Admitting: Radiation Oncology

## 2013-04-29 NOTE — Telephone Encounter (Signed)
Called patient she request RX xanax to be called to [facility] [place],, called [facility] [phone], left voice message for RX: Xanax, 0.25mg  take 1 tab every 8 hours as needed for anxiety,dispense 30 tabs no refill, per Dr.john Lisbeth Renshaw, can call for any questins [phone],  11:53 AM

## 2013-05-11 ENCOUNTER — Ambulatory Visit: Payer: BC Managed Care – PPO

## 2013-05-11 ENCOUNTER — Ambulatory Visit
Admission: RE | Admit: 2013-05-11 | Discharge: 2013-05-11 | Disposition: A | Discharge status: Home / Self Care | Payer: BC Managed Care – PPO | Source: Ambulatory Visit | Attending: Radiation Oncology | Admitting: Radiation Oncology

## 2013-05-11 NOTE — Progress Notes (Signed)
GYN Location of Tumor / Histology: Vulva, mass extending approx mid vagina Patient presented  months ago with symptoms of: pain, periodic drainage more stool via vafina,   Biopsies of  (if applicable) revealed: Diagnosis 04/26/13:Vulva, biopsy, vulvar mass- FRAGMENTS OF SQUAMOUS CELL CARCINOMA ASSOCIATED WITH NECROTIC DEBRIS ANDINFLAMMATION. - SEE COMMENT.Microscopic CommentSections demonstrate detached fragments of squamous cell carcinoma with associated necrotic debris and inflammation. Underlying tissue is not present to assess invasion. As sampled, the squamous cell carcinomaappears moderately differentiated. Dr. Leighton Ruff  Past/Anticipated interventions by Gyn/Onc surgery, if any:  referral to Dr. Skeet Latch appt on 05/12/13, Past/Anticipated interventions by medical oncology, if any: Dr. Benay Spice , weekly Cisplatin completed 6th weekly  On 08/09/12, F/u appt 05/19/13 Weight changes, if any:   Bowel/Bladder complaints, if any: recto vaginal fistula drainage  Nausea/Vomiting, if any: e  Pain issues, if any:    SAFETY ISSUES:  Prior radiation? Yes, 06/28/12-09/07/12 =  SCC of Anal canal 54Gy  Pacemaker/ICD?   Possible current pregnancy? No  Is the patient on methotrexate? No  Current Complaints / other details:  Recon , COPD/Oxygen dependence

## 2013-05-12 ENCOUNTER — Ambulatory Visit: Payer: BC Managed Care – PPO | Admitting: Gynecologic Oncology

## 2013-05-17 ENCOUNTER — Ambulatory Visit: Payer: BC Managed Care – PPO | Admitting: Critical Care Medicine

## 2013-05-19 ENCOUNTER — Ambulatory Visit: Payer: BC Managed Care – PPO | Attending: Gynecologic Oncology | Admitting: Gynecologic Oncology

## 2013-05-19 ENCOUNTER — Ambulatory Visit (HOSPITAL_BASED_OUTPATIENT_CLINIC_OR_DEPARTMENT_OTHER): Payer: BC Managed Care – PPO | Admitting: Nurse Practitioner

## 2013-05-19 ENCOUNTER — Encounter: Payer: Self-pay | Admitting: Gynecologic Oncology

## 2013-05-19 VITALS — BP 112/64 | HR 113 | Temp 98.0°F | Resp 18 | Ht 64.0 in | Wt 99.0 lb

## 2013-05-19 VITALS — BP 136/60 | HR 129 | Temp 98.2°F | Resp 20 | Ht 64.0 in | Wt 98.6 lb

## 2013-05-19 DIAGNOSIS — Z923 Personal history of irradiation: Secondary | ICD-10-CM | POA: Insufficient documentation

## 2013-05-19 DIAGNOSIS — C519 Malignant neoplasm of vulva, unspecified: Secondary | ICD-10-CM

## 2013-05-19 DIAGNOSIS — Z85048 Personal history of other malignant neoplasm of rectum, rectosigmoid junction, and anus: Secondary | ICD-10-CM

## 2013-05-19 DIAGNOSIS — C210 Malignant neoplasm of anus, unspecified: Secondary | ICD-10-CM

## 2013-05-19 DIAGNOSIS — C792 Secondary malignant neoplasm of skin: Secondary | ICD-10-CM | POA: Insufficient documentation

## 2013-05-19 DIAGNOSIS — J449 Chronic obstructive pulmonary disease, unspecified: Secondary | ICD-10-CM | POA: Insufficient documentation

## 2013-05-19 DIAGNOSIS — Z9221 Personal history of antineoplastic chemotherapy: Secondary | ICD-10-CM | POA: Insufficient documentation

## 2013-05-19 DIAGNOSIS — N9089 Other specified noninflammatory disorders of vulva and perineum: Secondary | ICD-10-CM

## 2013-05-19 DIAGNOSIS — F3289 Other specified depressive episodes: Secondary | ICD-10-CM | POA: Insufficient documentation

## 2013-05-19 DIAGNOSIS — F329 Major depressive disorder, single episode, unspecified: Secondary | ICD-10-CM | POA: Insufficient documentation

## 2013-05-19 DIAGNOSIS — J4489 Other specified chronic obstructive pulmonary disease: Secondary | ICD-10-CM | POA: Insufficient documentation

## 2013-05-19 DIAGNOSIS — F411 Generalized anxiety disorder: Secondary | ICD-10-CM | POA: Insufficient documentation

## 2013-05-19 DIAGNOSIS — G893 Neoplasm related pain (acute) (chronic): Secondary | ICD-10-CM

## 2013-05-19 MED ORDER — OXYCODONE HCL 5 MG PO TABS: 5.0000 mg | ORAL_TABLET | ORAL | Status: DC | PRN

## 2013-05-19 NOTE — Patient Instructions (Addendum)
We will make a referral to Palliative care/Hospice and they will contact you.

## 2013-05-19 NOTE — Progress Notes (Addendum)
  [facility] OFFICE PROGRESS NOTE   Diagnosis:  Anal versus vulvar cancer.  INTERVAL HISTORY:   She returns today for scheduled followup. She reports cessation of stool from the vagina about 2-3 months ago. She subsequently developed vaginal pain. She was seen by Dr. Leighton Ruff on 35/57/3220. She was found to have a vulvar mass extending to approximately the mid vagina. Biopsy of the vulvar mass showed fragments of squamous cell carcinoma associated with necrotic debris and inflammation.  She is taking oxycodone for the pain. She estimates taking 6 tablets per day. She is unable to sit for prolonged periods. Appetite is poor. She has lost some weight. She denies nausea/vomiting.  Objective:  Vital signs in last 24 hours:  Blood pressure 136/60, pulse 129, temperature 98.2 F (36.8 C), temperature source Oral, resp. rate 20, height 5\' 4"  (1.626 m), weight 98 lb 9.6 oz (44.725 kg), SpO2 95.00%.  Repeat heart rate 108.  HEENT: No thrush or ulcerations. Lymphatics: No palpable cervical, supraclavicular, axillary or inguinal lymph nodes. Resp: Breath sounds globally diminished. Cardio: Regular cardiac rhythm. GI: Abdomen soft and nontender. Vascular: No leg edema. Further examination deferred as she is seeing Dr. Skeet Latch later today.   Lab Results:  Lab Results  Component Value Date   WBC 2.5* 08/25/2012   HGB 11.0* 08/25/2012   HCT 33.1* 08/25/2012   MCV 104.5* 08/25/2012   PLT 197 08/25/2012   NEUTROABS 1.7 08/25/2012      Imaging:  No results found.  Medications: I have reviewed the patient's current medications.  Assessment/Plan: 1.Vulvar/Anal cancer, locally advanced, staging PET scan revealed hypermetabolic circumferential thickening at the distal rectum/anus, bilateral hypermetabolic inguinal nodes and no evidence of distant metastatic disease.  -Initiation of weekly cisplatin and concurrent radiation 06/28/2012. She completed the sixth weekly cisplatin on  08/09/2012. Radiation completed 09/07/2012.  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  3. Pain secondary to the locally advanced anal cancer -resolved.  4. COPD-oxygen dependent.  5. Hyperplastic polyps on colonoscopy 05/31/2012.  6. Nausea following cisplatin chemotherapy-improved with prophylactic Decadron following chemotherapy.  7. Allergic reaction with respiratory failure following Emend premedication on 07/07/2012-the respiratory failure improved with Solu-Medrol, bronchodilator therapy, and epinephrine.  8. 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 The cisplatin was dose reduced with week 6.  9. Vulvar mass on examination by Dr. Marcello Moores 04/26/2013. Biopsy showed fragments of squamous cell carcinoma associated with necrotic debris and inflammation. 10. Pain secondary to #9. We provided her with a new prescription for oxycodone.    Disposition: She has an appointment with Dr. Skeet Latch later today to determine surgical options. We scheduled a followup visit here in approximately 3 months but will adjust the appointment accordingly should chemotherapy be recommended. She understands she can contact the office prior to her next visit with any problems.  Patient seen with Dr. Benay Spice.    Ned Card ANP/GNP-BC   05/19/2013  12:28 PM This was a shared visit with Ned Card. She has been diagnosed with recurrent squamous cell carcinoma. I discussed the case with Dr. Skeet Latch.there is no option for surgical resection of the recurrent tumor. No therapy will be curative. We will arrange for a followup appointment to discuss a trial of palliative chemotherapy.  Julieanne Manson, M.D.

## 2013-05-19 NOTE — Progress Notes (Signed)
Office Visit Note: Gyn-Onc   CC:  Vulvar/Anal cancer  Assessment/Plan:  [name]  is a 58 y.o.  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  [name] has significant pulmonary compromise.  Findings are suspicious for a vulvar cancer with extension into the vagina and rectum. It's very difficult to determine whether or not this is a primary anal versus vulvar cancer.   She received chemoradiation with use of cis-platinum as a sensitizing agent completed 09/07/2012.  This treatment course was poorly tolerated. There was an initial  impressive response to this treatment.   Recurrent disease was confirmed 04/2013.  The disease is extensive involving the distal vagina, rectum and inferior labia bilaterally.  Satellite skin metastases are present.  [name] has a worse performance status that at diagnosis.  She has laboured breathing, a wet cough and poor performance status.  I suspect that there is distant disease.  This patient is not a candidate for total pelvic exenteration.  There are satellite cutaneous lesions and the procedure would require significant reconstruction with grafts.  Her heavy tobacco use would preclude reconstruction.  Her pulmonary health is tenuous and she would not be a candidate for such a procedure.    I recommended hospice and placed the referral.  HPI: This is a 58 y.o. who presented with a history of recurrent labial cyst for several months treated with Keflex. Patient states that biopsies were obtained but no malignancy was identified. She presented with fecal discharge from the vagina on 05/11/2012 she underwent colonoscopy was noted to have an anterior anal mass. 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  Other findings of colonoscopy were that of diverticulosis and a few small polypoid flat-shaped polyps.   PET scan 06/16/2012.  There is intense hypermetabolic mass-like thickening surrounding the  distal rectum and extending to the anus. This extends from approximately S1 inferiorly to the level of the anal verge. The circumferential mass-like thickening measures up to 2.5 cm in single wall thickness with SUV max = 19.8. There is a hypermetabolic right inguinal lymph node measuring 13 mm (image 226) with SUV max = 13.1. Smaller hypermetabolic left inguinal lymph (222) measuring 10 mm.  Skeleton: No focal hypermetabolic activity to suggest skeletal metastasis.  It was difficult to determine whether this is vulvar or anal cancer.  The patient  received radiotherapy with CDDP sensitization that began 5/12//2014 and was completed 09/08/2102  Total 54Gy.  The response was impressive.  Patient presented to Dr. Marcello Moores 04/26/2013 with c/o pain and rectovaginal fistula and a vulvar lesion.  Biopsy c/w SCCA.  Patient reports fecal material in the vagina, foul smelling external genitalia and continuous urinary incontinence.  She reports a 10 pound weight loss over the past three months.  She reports poor appetite, nausea, SOB, wet cough, DOE.  Spends more that 3/4 of the day in bed.  Very little energy.  She presents for discussion of her surgical and chemotherapy options.  Social Hx:   Lives alone.  Her sister is a CNA and would make herself available for her sister's care.  Past Surgical Hx:  Past Surgical History  Procedure Laterality Date  . Nasal sinus surgery    . Tonsillectomy    . Knee surgery  2003    cartlage repair  . Breast lumpectomy Left 15-20 yrs ago    benign cyst  . Rectal abscess removal    . Colonoscopy N/A 05/31/2012  Procedure: COLONOSCOPY;  Surgeon: Leighton Ruff, MD;  Location: [facility];  Service: Endoscopy;  Laterality: N/A;  . Cardiac catherization      no blockages    Past Medical Hx:  Past Medical History  Diagnosis Date  . Tobacco use disorder   . Depression   . Anxiety   . Allergic rhinitis     allergy shots  w/o significant response  . Hyperglycemia    . COPD (chronic obstructive pulmonary disease)   . Cancer 05/11/12    rectal cancer, XRT/ChemorX per [facility]  . Bartholin cyst 07/2011  . Recto-vaginal fistula   . Osteopenia   . History of radiation therapy 06/28/2012-09/07/2012    54 Gy to rectal area    Past Gynecological History:  Gravida 1 para 0 menarche at age 6 with regular menses until 2-3 years ago history of cervical dysplasia treated with cryotherapy. Patient states that recent Pap tests have been within normal limits No LMP recorded. Patient is postmenopausal.  Family Hx:  Family History  Problem Relation Age of Onset  . Lung cancer Father   . COPD Father   . Hyperlipidemia Father   . Cancer Father   . Factor IX deficiency Mother     PTE  . Asthma Sister   . Hypertension Sister   . Heart attack Maternal Grandmother   . Heart attack Maternal Grandfather   . Heart attack Paternal Grandmother     Review of Systems:  Constitutional  Feels weak. Cardiovascular  Pulmonary  Cough, productive denies hemoptysis. Continues to smoke Gastro Intestinal  No nausea, vomitting, or diarrhoea. No abdominal pain, reports fecal material exuding from the vagina, Genito Urinary  Urinary incontinence, fecal material in the vagina Musculo Skeletal  No myalgia, arthralgia, joint swelling or pain  Neurologic  Fatigue,  Psychology  Spends two thirds of the day in bed.    Vitals: BP 112/64  Pulse 113  Temp(Src) 98 F (36.7 C) (Oral)  Resp 18  Ht 5\' 4"  (1.626 m)  Wt 99 lb (44.906 kg)  BMI 16.98 kg/m2  SpO2 98%  Wt Readings from Last 3 Encounters:  05/19/13 99 lb (44.906 kg)  05/19/13 98 lb 9.6 oz (44.725 kg)  04/26/13 98 lb (44.453 kg)     Physical Exam: WD in NAD, thin cachectic with nasal oxygen cannula, wet non productive cough, laboured breathing Neck  Supple NROM, without any enlargements.  Lymph Node Survey No  inguinal adenopathy Cardiovascular  Pulse normal rate,  Lungs  Clear to auscultation  bilateraly, breath sounds present only in the upper two thirds of the lungs. Psychiatry  Alert and oriented flat affect,  Abdomen  Normoactive bowel sounds, abdomen soft, non-tender. Back No CVA tenderness Genito Urinary  Vulva/vagina: Metastases to the right lateral  labia majora evident.  Patient now with necrotic tumor involving the distal 2/3 vagina and posterior vaginal wall with a distal cloca.  Malignancy  extends along the lateral vaginal sidewall.  Urine is  seeping out of the vagina, Tumor is necrotic.  Brawniness of the surrounding labia majora and perirectal area concerning for lymphangitic infiltration. Extremities  No bilateral cyanosis, clubbing or edema.    Janie Morning, MD, PhD 05/19/2013, 2:14 PM

## 2013-05-20 ENCOUNTER — Telehealth: Payer: Self-pay | Admitting: *Deleted

## 2013-05-20 NOTE — Telephone Encounter (Signed)
Called patient at request of Dr. Benay Spice : Inquired if she would like to come in and see him/Lisa to discuss chemotherapy that could help her pain, slow progression of the cancer, prolong her life; or does she prefer to pursue the Hospice referral now? Patient is interested in talking about chemo before initiation of Hospice referral. MD notified.

## 2013-05-23 ENCOUNTER — Telehealth: Payer: Self-pay | Admitting: *Deleted

## 2013-05-23 ENCOUNTER — Other Ambulatory Visit: Payer: Self-pay | Admitting: *Deleted

## 2013-05-23 MED ORDER — ALPRAZOLAM 0.25 MG PO TABS: ORAL_TABLET | ORAL | Status: AC

## 2013-05-23 NOTE — Telephone Encounter (Signed)
Informed pt of appt with Dr Benay Spice for 05/26/13 @ 1:30 pm.  She wanted to know if Dr Skeet Latch would be there also.  Informed that she would not be.  Pt also asked for xanax script refill.  Will discuss with Dr Benay Spice.  Script originally from Dr Lisbeth Renshaw.

## 2013-05-24 ENCOUNTER — Telehealth: Payer: Self-pay | Admitting: Oncology

## 2013-05-24 NOTE — Telephone Encounter (Signed)
mailed appt to pt

## 2013-05-25 ENCOUNTER — Ambulatory Visit: Payer: BC Managed Care – PPO | Admitting: Radiation Oncology

## 2013-05-25 ENCOUNTER — Ambulatory Visit: Payer: BC Managed Care – PPO

## 2013-05-26 ENCOUNTER — Telehealth: Payer: Self-pay | Admitting: *Deleted

## 2013-05-26 ENCOUNTER — Ambulatory Visit: Payer: BC Managed Care – PPO | Admitting: Oncology

## 2013-05-26 NOTE — Telephone Encounter (Signed)
Received call from pt states "I will not be coming today because I'm sick; need to re-schedule"  Pt reports she started "throwing up & has a little diarrhea" last night.  Denies fever/pain; eating and drinking well.  Pt instructed to call office is symptoms get worse or fever.  Pt verbalized understanding and POF sent to schedulers.  MD made aware.

## 2013-05-31 ENCOUNTER — Telehealth: Payer: Self-pay | Admitting: Oncology

## 2013-05-31 NOTE — Telephone Encounter (Signed)
lmonvm for pt re appt for 5/15. r/s from 4/9 per 4/9 pof. schedule mailed.

## 2013-06-03 ENCOUNTER — Telehealth: Payer: Self-pay | Admitting: Oncology

## 2013-06-03 ENCOUNTER — Telehealth: Payer: Self-pay | Admitting: *Deleted

## 2013-06-03 NOTE — Telephone Encounter (Signed)
Called pt and left message regarding  May appt, mailed appt

## 2013-06-03 NOTE — Telephone Encounter (Signed)
Call from pt requesting to reschedule 5/15 office visit. Stated she needs morning appt so her family can come with her to visit. No AM appts available. Order sent to schedulers to move appt to 5/14 at 1215 as pt states this will better than late afternoon.

## 2013-06-15 ENCOUNTER — Telehealth: Payer: Self-pay | Admitting: Critical Care Medicine

## 2013-06-15 NOTE — Telephone Encounter (Signed)
Absolutely  Ask the pt if she has been referred for hospice, we can do this and she would benefit

## 2013-06-15 NOTE — Telephone Encounter (Signed)
Spoke with Sherry Bautista. States that she has been diagnosed with uterine cancer and has been given 6 months or less to live. Sherry Bautista is wondering if [facility] would continue to fill her prescriptions without her having to come in to be seen. Was last seen 10/2012.  PW - please advise. Thanks.

## 2013-06-15 NOTE — Telephone Encounter (Signed)
lmomtcb for pt - pt to ask for me.

## 2013-06-16 ENCOUNTER — Ambulatory Visit: Payer: BC Managed Care – PPO | Admitting: Critical Care Medicine

## 2013-06-16 NOTE — Telephone Encounter (Signed)
lmomtcb for pt 

## 2013-06-16 NOTE — Telephone Encounter (Signed)
lmomtcb for pt - asked she ask for me

## 2013-06-17 ENCOUNTER — Other Ambulatory Visit: Payer: Self-pay | Admitting: *Deleted

## 2013-06-17 DIAGNOSIS — C519 Malignant neoplasm of vulva, unspecified: Secondary | ICD-10-CM

## 2013-06-17 DIAGNOSIS — C210 Malignant neoplasm of anus, unspecified: Secondary | ICD-10-CM

## 2013-06-17 DIAGNOSIS — N9089 Other specified noninflammatory disorders of vulva and perineum: Secondary | ICD-10-CM

## 2013-06-17 MED ORDER — OXYCODONE HCL 5 MG PO TABS: 5.0000 mg | ORAL_TABLET | ORAL | Status: AC | PRN

## 2013-06-17 NOTE — Telephone Encounter (Signed)
Called, spoke with pt.  Informed her we would definitely refill her meds without OV.  She was very appreciative of this.  Pt reports she has refills right now and doesn't need rxs to be sent.  She will let us know when she does need rxs.  Pt states she hasn't been set up with hospice yet.  She believes this may happen when she sees Dr. Benay Spice next month.  I offered to place referral and have this set up now and explained PW feels pt would really benefit from this service.  Pt states she would like to hold off on this for now.  She will call back if she decides she would like this set up prior to oncology appt.

## 2013-06-17 NOTE — Telephone Encounter (Signed)
Notified that script is ready. 

## 2013-06-17 NOTE — Telephone Encounter (Signed)
VM requesting refill on oxyir-"almost out".

## 2013-06-17 NOTE — Telephone Encounter (Signed)
ATC x 3 - line busy.  WCB 

## 2013-06-30 ENCOUNTER — Telehealth: Payer: Self-pay | Admitting: Oncology

## 2013-06-30 ENCOUNTER — Ambulatory Visit: Payer: BC Managed Care – PPO | Admitting: Oncology

## 2013-06-30 ENCOUNTER — Telehealth: Payer: Self-pay | Admitting: *Deleted

## 2013-06-30 MED ORDER — HYDROMORPHONE HCL 4 MG PO TABS: 4.0000 mg | ORAL_TABLET | ORAL | Status: AC | PRN

## 2013-06-30 NOTE — Telephone Encounter (Signed)
Pt requested to reschedule office visit as soon as possible. Called with 07/01/13 appt, she agreed to come in. Rx will be left in prescription book for pick up.

## 2013-06-30 NOTE — Telephone Encounter (Signed)
Call from pt reporting she will miss office visit today due to migraine. Having increased pain in "uterus and intestines" that is not responding to Oxycodone. Requesting new prescription for Demerol. Reviewed with Dr. Benay Spice: Try Dilaudid for pain. Offer pt hospice referral, we can see her anytime. Pt declined hospice referral, stated she wants to come in to discuss chemo. Requests narcotic Rx to be mailed to her.  1027: Pt left message requesting Rx to be left at front desk, she will have someone pick up Rx for her.

## 2013-06-30 NOTE — Telephone Encounter (Signed)
Message from pt requesting to cancel appt for 07/01/13. Requests hospice referral. Per Dr. Benay Spice: refer to [facility]. Referral form filled out, given to HIM dept to attach records/ fax.

## 2013-06-30 NOTE — Telephone Encounter (Signed)
added appt per pof...pt already aware

## 2013-07-01 ENCOUNTER — Ambulatory Visit: Payer: BC Managed Care – PPO | Admitting: Oncology

## 2013-07-01 ENCOUNTER — Telehealth: Payer: Self-pay | Admitting: Oncology

## 2013-07-01 NOTE — Telephone Encounter (Signed)
Faxed pt medical records to [facility]osp[facility]

## 2013-07-27 ENCOUNTER — Telehealth: Payer: Self-pay | Admitting: *Deleted

## 2013-08-17 NOTE — Telephone Encounter (Signed)
Caryl Pina RN with [facility] in [place], [place]. called reporting patient just passed away.  Will notify providers.

## 2013-08-17 DEATH — deceased

## 2013-08-25 ENCOUNTER — Ambulatory Visit: Payer: BC Managed Care – PPO | Admitting: Oncology

## 2013-10-11 ENCOUNTER — Other Ambulatory Visit: Payer: Self-pay | Admitting: Pharmacist

## 2014-07-11 IMAGING — CT NM PET TUM IMG INITIAL (PI) SKULL BASE T - THIGH
6 series · 25 of 25 positions shown · IV contrast (350 OM)
Comparison: Chest CT 09/09/1998 [DATE]

CLINICAL DATA: Initial treatment strategy for anal carcinoma.

NUCLEAR MEDICINE PET SKULL BASE TO THIGH
Fasting Blood Glucose:  100
TECHNIQUE: 18.5 mCi F-18 FDG was injected intravenously. CT data
was obtained and used for attenuation correction and anatomic
localization only.  (This was not acquired as a diagnostic CT
examination.) Additional exam technical data entered on
technologist worksheet.

[Series 1: pet ac · axial · 3.3mm · 4.69mm/px · z∈[-888,-18]mm · 5 of 267 slices shown]
[im 1/267]
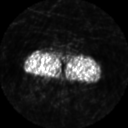
[im 67/267]
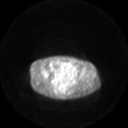
[im 134/267]
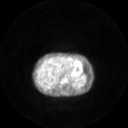
[im 200/267]
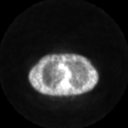
[im 267/267]
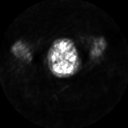

[Series 2: pet nac · axial · 3.3mm · 4.69mm/px · z∈[-888,-18]mm · 6 of 267 slices shown]
[im 1/267]
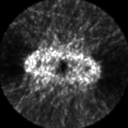
[im 54/267]
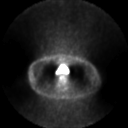
[im 107/267]
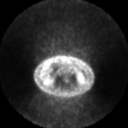
[im 160/267]
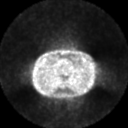
[im 213/267]
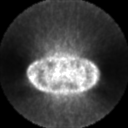
[im 267/267]
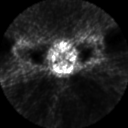

[Series 2: ct images · axial · 3.8mm · 0.98mm/px · z∈[-888,-18]mm · 6 of 267 slices shown]
[im 1/267]
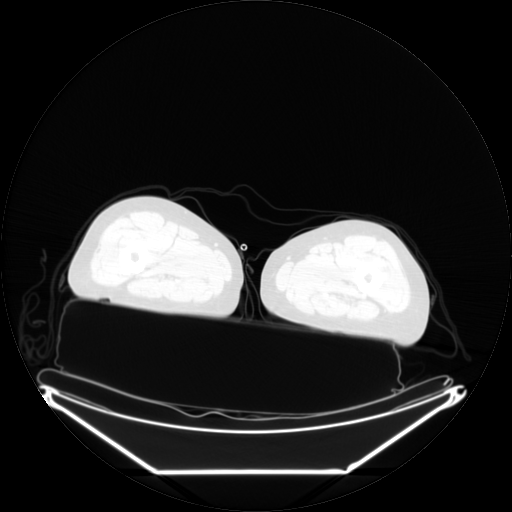
[im 54/267]
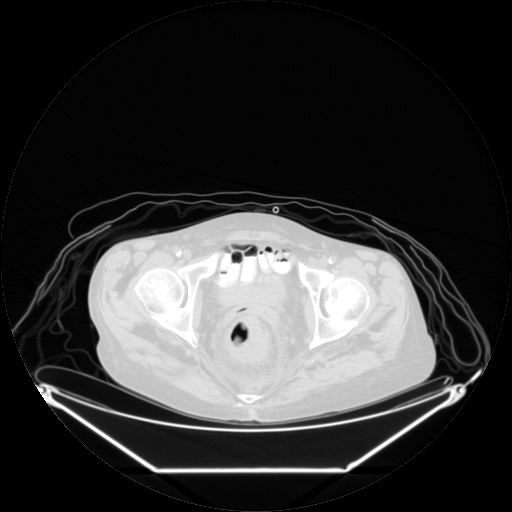
[im 107/267]
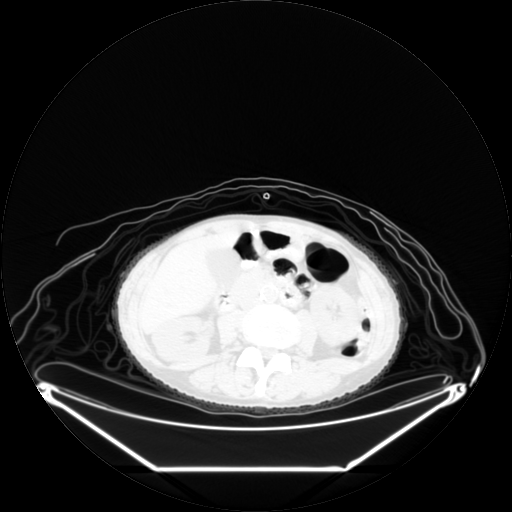
[im 160/267]
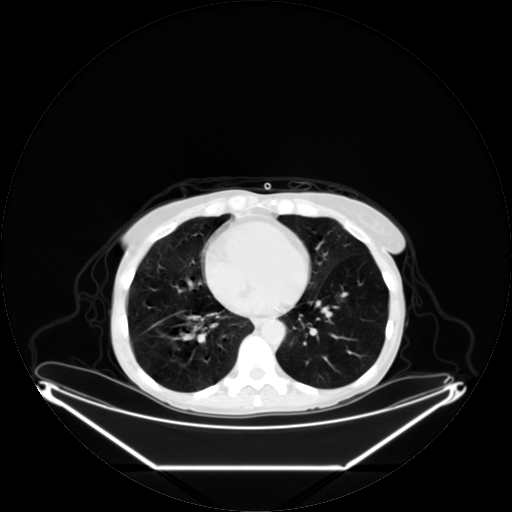
[im 213/267]
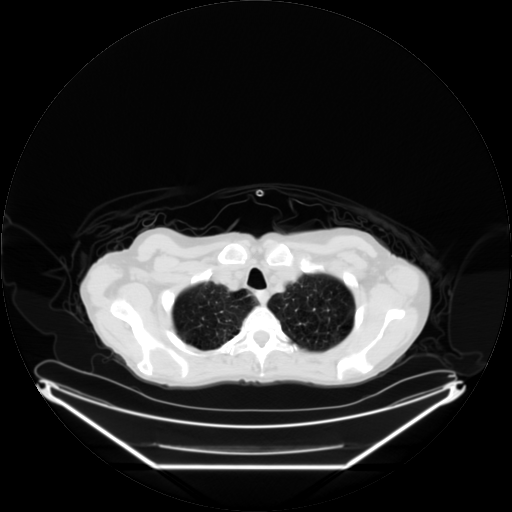
[im 267/267  brain]
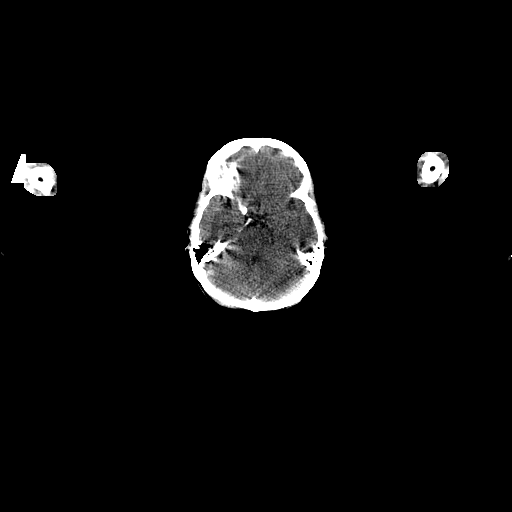

[Series 123: mip · coronal · 3.3mm · 4.69mm/px · 1 of 30 slices shown]
[im 1/30]
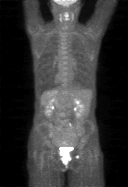

[Series 151: reformatted · axial · 3.3mm · 3.91mm/px · z∈[-888,-18]mm · 6 of 265 slices shown (1 of 2)]
[im 1/265]
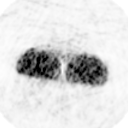
[im 53/265]
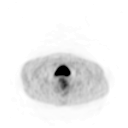
[im 106/265]
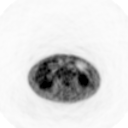
[im 159/265]
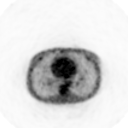
[im 212/265]
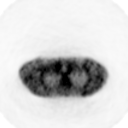
[im 265/265]
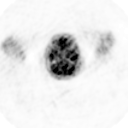

[Series 153: reformatted · coronal · 4.7mm · 6.98mm/px · 1 of 58 slices shown (2 of 2)]
[im 1/58]
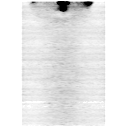

[25 of 25 positions shown; findings below may reference images not displayed]

FINDINGS: Neck: No hypermetabolic lymph nodes in the neck.

Chest:  No hypermetabolic mediastinal or hilar nodes.  No
suspicious pulmonary nodules on the CT scan. Mild nodular pleural
thickening in the posterior medial left lower lobe (image 99) does
not have associated metabolic activity.

Abdomen/Pelvis:  There is intense hypermetabolic mass-like
thickening surrounding the distal rectum and extending to the anus.
This extends from approximately S1 inferiorly to the level of the
anal verge.  The circumferential mass-like thickening measures up
to 2.5 cm in single wall thickness with SUV max = 19.8.

There is a hypermetabolic right inguinal lymph node measuring 13 mm
(image 226) with SUV max = 13.1.  Smaller hypermetabolic left
inguinal lymph  (222)  measuring 10 mm.

Skeleton:  No focal hypermetabolic activity to suggest skeletal
metastasis.
IMPRESSION: 1..  Hypermetabolic circumferential thickening over a long segment
of distal rectum and anus extending from the inferior margin of the
sacrum to the anal verge.  This consistent with primary invasive
anal cancer.
2.  Bilateral hypermetabolic small inguinal lymph nodes consistent
with local nodal metastasis.
3.  No evidence of more distant nodal metastasis in the abdomen.
4.  No evidence of distant metastatic disease in the chest or
abdomen.
